# Patient Record
Sex: Female | Born: 1980 | Hispanic: Yes | Marital: Married | Smoking: Never smoker
Health system: Southern US, Community
[De-identification: ages and names within clinical notes are randomized; demographics above are authoritative.]

## PROBLEM LIST (undated history)

## (undated) DIAGNOSIS — K2970 Gastritis, unspecified, without bleeding: Secondary | ICD-10-CM

## (undated) DIAGNOSIS — N814 Uterovaginal prolapse, unspecified: Secondary | ICD-10-CM

## (undated) DIAGNOSIS — D649 Anemia, unspecified: Secondary | ICD-10-CM

## (undated) DIAGNOSIS — I10 Essential (primary) hypertension: Secondary | ICD-10-CM

## (undated) DIAGNOSIS — N83209 Unspecified ovarian cyst, unspecified side: Secondary | ICD-10-CM

## (undated) DIAGNOSIS — A048 Other specified bacterial intestinal infections: Secondary | ICD-10-CM

## (undated) HISTORY — PX: TUBAL LIGATION: SHX77

## (undated) HISTORY — DX: Anemia, unspecified: D64.9

## (undated) HISTORY — PX: WISDOM TOOTH EXTRACTION: SHX21

## (undated) HISTORY — DX: Other specified bacterial intestinal infections: A04.8

---

## 2014-09-17 ENCOUNTER — Encounter (HOSPITAL_COMMUNITY): Payer: Self-pay | Admitting: Family Medicine

## 2014-09-17 ENCOUNTER — Emergency Department (INDEPENDENT_AMBULATORY_CARE_PROVIDER_SITE_OTHER)
Admission: EM | Admit: 2014-09-17 | Discharge: 2014-09-17 | Disposition: A | Discharge status: Home / Self Care | Payer: Medicaid Other | Source: Home / Self Care

## 2014-09-17 DIAGNOSIS — K2100 Gastro-esophageal reflux disease with esophagitis, without bleeding: Secondary | ICD-10-CM

## 2014-09-17 DIAGNOSIS — K5900 Constipation, unspecified: Secondary | ICD-10-CM

## 2014-09-17 DIAGNOSIS — A048 Other specified bacterial intestinal infections: Secondary | ICD-10-CM

## 2014-09-17 DIAGNOSIS — B9681 Helicobacter pylori [H. pylori] as the cause of diseases classified elsewhere: Secondary | ICD-10-CM

## 2014-09-17 HISTORY — DX: Gastritis, unspecified, without bleeding: K29.70

## 2014-09-17 HISTORY — DX: Unspecified ovarian cyst, unspecified side: N83.209

## 2014-09-17 LAB — POCT URINALYSIS DIP (DEVICE)
Bilirubin Urine: NEGATIVE
GLUCOSE, UA: NEGATIVE mg/dL
HGB URINE DIPSTICK: NEGATIVE
Ketones, ur: NEGATIVE mg/dL
Nitrite: NEGATIVE
Protein, ur: NEGATIVE mg/dL
Specific Gravity, Urine: 1.025 (ref 1.005–1.030)
UROBILINOGEN UA: 0.2 mg/dL (ref 0.0–1.0)
pH: 6 (ref 5.0–8.0)

## 2014-09-17 LAB — POCT PREGNANCY, URINE: Preg Test, Ur: NEGATIVE

## 2014-09-17 LAB — POCT H PYLORI SCREEN: H. PYLORI SCREEN, POC: POSITIVE — AB

## 2014-09-17 MED ORDER — SENNA 8.6 MG PO TABS
1.0000 | ORAL_TABLET | Freq: Every day | ORAL | Status: DC
Start: 2014-09-17 — End: 2015-03-20

## 2014-09-17 MED ORDER — POLYETHYLENE GLYCOL 3350 17 G PO PACK: 17.0000 g | PACK | Freq: Every day | ORAL | Status: DC | PRN

## 2014-09-17 MED ORDER — ONDANSETRON HCL 4 MG PO TABS: 4.0000 mg | ORAL_TABLET | Freq: Three times a day (TID) | ORAL | Status: DC | PRN

## 2014-09-17 MED ORDER — AMOXICILL-CLARITHRO-LANSOPRAZ PO MISC
Freq: Two times a day (BID) | ORAL | Status: DC
Start: 2014-09-17 — End: 2015-03-20

## 2014-09-17 NOTE — ED Provider Notes (Addendum)
CSN: [id number]     Arrival date & time 09/17/14  1347 History   None    Chief Complaint  Patient presents with  . Hematemesis    x 3 days  . Constipation    x 3 days  . Headache  . Abdominal Pain    x 2 weeks   (Consider location/radiation/quality/duration/timing/severity/associated sxs/prior Treatment) HPI  Pt encounter aided w/ the help of an interpreter - Spanish   2 wks of abd pain. 3 days ago started developing n/v. About 3 episode daily of non-bilious emesis. No BM over past 3 days. Associated w/ feeling of SOB and HA. Went to school today and became nauseas and started to dry heave. Small amount of phelgm came up and at the end a small amount of blood, and was told to come to ED for further evaluation. Symptoms are not associated w/ food. Able to tolerate some PO. Associated w/ burnjing in throat.   LMP 08/22/15  Seen by [facility]amp O[facility] on 09/11/14 and given ABX for UTI. Still w/ dysuria since that time. No improvement w/ ABX.   Past Medical History  Diagnosis Date  . Gastritis   . Ovarian cyst    History reviewed. No pertinent past surgical history. Family History  Problem Relation Age of Onset  . Asthma Mother   . Diabetes Father    History  Substance Use Topics  . Smoking status: Never Smoker   . Smokeless tobacco: Not on file  . Alcohol Use: No   OB History    No data available     Review of Systems Per HPI with all other pertinent systems negative.   Allergies  Review of patient's allergies indicates no known allergies.  Home Medications   Prior to Admission medications   Not on File   BP 112/81 mmHg  Pulse 78  Temp(Src) 97.8 F (36.6 C) (Oral)  Resp 24  SpO2 95%  LMP 08/21/2014 (Exact Date) Physical Exam  Constitutional: She appears well-developed and well-nourished.  HENT:  Head: Normocephalic and atraumatic.  Eyes: EOM are normal. Pupils are equal, round, and reactive to light.  Neck: Normal range of motion.  Cardiovascular:  Normal rate and normal heart sounds.   No murmur heard. Pulmonary/Chest: Effort normal and breath sounds normal. No respiratory distress. She has no wheezes.  Abdominal: Soft. Bowel sounds are normal. She exhibits no distension and no mass. There is no tenderness. There is no rebound and no guarding.  Musculoskeletal: Normal range of motion. She exhibits no edema or tenderness.  Neurological: No cranial nerve deficit. Coordination normal.  Skin: Skin is warm.  Psychiatric: Her behavior is normal. Judgment and thought content normal.    ED Course  Procedures (including critical care time) Labs Review Labs Reviewed  POCT URINALYSIS DIP (DEVICE) - Abnormal; Notable for the following:    Leukocytes, UA TRACE (*)    All other components within normal limits  POCT H PYLORI SCREEN - Abnormal; Notable for the following:    H. PYLORI SCREEN, POC POSITIVE (*)    All other components within normal limits  POCT PREGNANCY, URINE    Imaging Review No results found.   MDM   1. Constipation, unspecified constipation type   2. Gastroesophageal reflux disease with esophagitis   3. H. pylori infection     + for H.Pylori: will treat w/ prevpack  Abd pain likely multifactorial from GERD and reflux and constipation  No sign of recurrent UTI on UA Pregnancy test neg  H/H showing anemia w/ Hgb 10.9. Asymptomatic. F/u PCP. No need for further eval for GI bleed. Unless persists  Constipation- start miralax and senna  Precautions given and all questions answered  Shelly Flattenavid Tex Conroy, MD Family Medicine 09/17/2014, 3:02 PM    Ozella Rocksavid J Madalena Kesecker, MD 09/17/14 1502  Ozella Rocksavid J Lizzette Carbonell, MD 09/17/14 [phone]

## 2014-09-17 NOTE — ED Notes (Signed)
Pt comes in today with complaints of burning, abdominal pain x [redacted] weeks along with constipation and bloody vomit x 3 days.  Pt also suffers from a headache.  Maggie was in the room serving as interpreter.

## 2014-09-17 NOTE — Discharge Instructions (Signed)
You have a stomach infection cause by H Pylori. This will require several medications to cure Please start the miralax and senna for constipation. Take these medications until you have daily bowel movements Please take the zofran for nausea your blood counts were a little low. please follow up with your regular doctor or go to the emergency room if you develop worsening bloody vomit  Usted tiene una causa infeccin estomacal por H Pylori. Esto requerir varios medicamentos para curar Por favor inicie la Miralax y senna para Magazine features editorel estreimiento. Tomar estos medicamentos hasta que tenga evacuaciones diarias Por favor tmese el zofran para las nuseas sus cuentas de sangre eran un poco baja. por favor, el seguimiento con su mdico regular o vaya a la sala de emergencia si usted desarrolla empeoramiento vmito sangriento

## 2014-12-16 ENCOUNTER — Ambulatory Visit: Payer: Medicaid Other

## 2015-02-17 ENCOUNTER — Other Ambulatory Visit: Payer: Self-pay | Admitting: *Deleted

## 2015-02-17 DIAGNOSIS — N83209 Unspecified ovarian cyst, unspecified side: Secondary | ICD-10-CM

## 2015-02-18 ENCOUNTER — Telehealth: Payer: Self-pay

## 2015-02-18 NOTE — Telephone Encounter (Signed)
Patient referred from [facility] for ovarian cyst. Per Dr. Erin Fulling patient OK to have visit, however, U/S to be completed first. U/S order placed. U/S scheduled for 02/26/15 1030. Attempted to contact patient with pacific interpreter [phone]. Patient answered and then hung up. Attempted a second call. Straight to voicemail. Left message stating we are trying to reach you regarding an appointment, please call clinic. Referral placed in folder to be scheduled.

## 2015-02-21 NOTE — Telephone Encounter (Signed)
Called patient with pacific interpreter [phone] at spouses' number. No answer- left message stating we are trying to reach you when an appt please call us back at the clinics

## 2015-02-24 NOTE — Telephone Encounter (Signed)
Called patient at spouse's number with pacific interpreter [phone] and informed patient of appt and that our front office will be calling you with a follow up appt as well. Patient verbalized understanding to all and had no questions

## 2015-02-26 ENCOUNTER — Ambulatory Visit (HOSPITAL_COMMUNITY)
Admission: RE | Admit: 2015-02-26 | Discharge: 2015-02-26 | Disposition: A | Discharge status: Home / Self Care | Payer: Medicaid Other | Source: Ambulatory Visit | Attending: Obstetrics & Gynecology | Admitting: Obstetrics & Gynecology

## 2015-02-26 DIAGNOSIS — R1031 Right lower quadrant pain: Secondary | ICD-10-CM | POA: Diagnosis present

## 2015-02-26 DIAGNOSIS — N8320 Unspecified ovarian cysts: Secondary | ICD-10-CM | POA: Insufficient documentation

## 2015-02-26 DIAGNOSIS — N83209 Unspecified ovarian cyst, unspecified side: Secondary | ICD-10-CM

## 2015-03-18 ENCOUNTER — Encounter: Payer: Self-pay | Admitting: *Deleted

## 2015-03-20 ENCOUNTER — Ambulatory Visit (INDEPENDENT_AMBULATORY_CARE_PROVIDER_SITE_OTHER): Payer: Medicaid Other | Admitting: Obstetrics & Gynecology

## 2015-03-20 ENCOUNTER — Encounter: Payer: Self-pay | Admitting: Obstetrics & Gynecology

## 2015-03-20 VITALS — BP 128/81 | HR 76 | Temp 98.7°F | Ht 59.0 in | Wt 220.8 lb

## 2015-03-20 DIAGNOSIS — N7011 Chronic salpingitis: Secondary | ICD-10-CM | POA: Diagnosis present

## 2015-03-20 DIAGNOSIS — N920 Excessive and frequent menstruation with regular cycle: Secondary | ICD-10-CM | POA: Diagnosis not present

## 2015-03-20 DIAGNOSIS — N946 Dysmenorrhea, unspecified: Secondary | ICD-10-CM

## 2015-03-20 HISTORY — DX: Dysmenorrhea, unspecified: N94.6

## 2015-03-20 MED ORDER — NORGESTIM-ETH ESTRAD TRIPHASIC 0.18/0.215/0.25 MG-25 MCG PO TABS: 1.0000 | ORAL_TABLET | Freq: Every day | ORAL | Status: DC

## 2015-03-20 NOTE — Patient Instructions (Signed)
Levonorgestrel intrauterine device (IUD) Qu es este medicamento? El LEVONORGESTREL (DIU) es un dispositivo anticonceptivo (control de natalidad). El dispositivo se coloca dentro del tero por un profesional de la salud. Se utiliza para evitar el embarazo y tambin se puede utilizar para tratar el sangrado abundante que ocurre durante su perodo. Dependiendo del dispositivo, se puede utilizar por 3 a 5 aos. Este medicamento puede ser utilizado para otros usos; si tiene alguna pregunta consulte con su proveedor de atencin mdica o con su farmacutico. MARCAS COMERCIALES DISPONIBLES: LILETTA, Mirena, Skyla Qu le debo informar a mi profesional de la salud antes de tomar este medicamento? Necesita saber si usted presenta alguno de los siguientes problemas o situaciones: -exmen de Papanicolaou anormal -cncer de mama, cuello del tero o tero -diabetes -endometritis -si tiene una infeccin plvica o genital actual o en el pasado -tiene ms de una pareja sexual o si su pareja tiene ms de una pareja -enfermedad cardiaca -antecedente de embarazo tubrico o ectpico -problemas del sistema inmunolgico -DIU colocado -enfermedad heptica o tumor del hgado -problemas con la coagulacin o si toma diluyentes sanguneos -usa medicamentos intravenoso -forma inusual del tero -sangrado vaginal que no tiene explicacin -una reaccin alrgica o inusual al levonorgestrel, a otras hormonas, a la silicona o polietilenos, a otros medicamentos, alimentos, colorantes o conservantes -si est embarazada o buscando quedar embarazada -si est amamantando a un beb Cmo debo utilizar este medicamento? Un profesional de la salud coloca este dispositivo en el tero. Hable con su pediatra para informarse acerca del uso de este medicamento en nios. Puede requerir atencin especial. Sobredosis: Pngase en contacto inmediatamente con un centro toxicolgico o una sala de urgencia si usted cree que haya tomado  demasiado medicamento. ATENCIN: Este medicamento es solo para usted. No comparta este medicamento con nadie. Qu sucede si me olvido de una dosis? No se aplica en este caso. Qu puede interactuar con este medicamento? No tome esta medicina con ninguno de los siguientes medicamentos: -amprenavir -bosentano -fosamprenavir Esta medicina tambin puede interactuar con los siguientes medicamentos: -aprepitant -barbitricos para producir el sueo o para el tratamiento de convulsiones -bexaroteno -griseofulvina -medicamentos para tratar los convulsiones, tales como carbamazepina, etotona, felbamato, oxcarbazepina, fenitona, topiramato -modafinilo -pioglitazona -rifabutina -rifampicina -rifapentina -algunos medicamentos para tratar el virus VIH, tales como atazanavir, indinavir, lopinavir, nelfinavir, tipranavir, ritonavir -hierba de [place] -warfarina Puede ser que esta lista no menciona todas las posibles interacciones. Informe a su profesional de la salud de todos los productos a base de hierbas, medicamentos de venta libre o suplementos nutritivos que est tomando. Si usted fuma, consume bebidas alcohlicas o si utiliza drogas ilegales, indqueselo tambin a su profesional de la salud. Algunas sustancias pueden interactuar con su medicamento. A qu debo estar atento al usar este medicamento? Visite a su mdico o a su profesional de la salud para chequear su evolucin peridicamente. Visite a su mdico si usted o su pareja tiene relaciones sexuales con otras personas, se vuelve VIH positivo o contrae una enfermedad de transmisin sexual. Este medicamento no la protege de la infeccin por VIH (SIDA) ni de ninguna otra enfermedad de transmisin sexual. Puede controlar la ubicacin del DIU usted misma palpando con sus dedos limpios los hilos en la parte anterior de la vagina. No tire de los hilos. Es un buen hbito controlar la ubicacin del dispositivo despus de cada perodo menstrual. Si  no slo siente los hilos sino que adems siente otra parte ms del DIU o si no puede sentir los hilos, consulte a su   mdico inmediatamente. El DIU puede salirse por s solo. Puede quedar embarazada si el dispositivo se sale de su lugar. Utilice un mtodo anticonceptivo adicional, como preservativos, y consulte a su proveedor de atencin mdica s observa que el DIU se sali de su lugar. La utilizacin de tampones no cambia la posicin del DIU y no hay inconvenientes en usarlos durante su perodo. Qu efectos secundarios puedo tener al utilizar este medicamento? Efectos secundarios que debe informar a su mdico o a su profesional de la salud tan pronto como sea posible: -reacciones alrgicas como erupcin cutnea, picazn o urticarias, hinchazn de la cara, labios o lengua -fiebre, sntomas gripales -llagas genitales -alta presin sangunea -ausencia de un perodo menstrual durante 6 semanas mientras lo utiliza -dolor, hinchazn o calor en las piernas -dolor o sensibilidad del plvico -dolor de cabeza repentino o severo -signos de embarazo -calambres estomacales -falta de aliento repentina -problemas de coordinacin, del habla, al caminar -sangrado, flujo vaginal inusual -color amarillento de los ojos o la piel Efectos secundarios que, por lo general, no requieren atencin mdica (debe informarlos a su mdico o a su profesional de la salud si persisten o si son molestos): -acn -dolor de pecho -cambios en el deseo sexual o capacidad -cambios de peso -calambres, mareos o sensacin de desmayo mientras se introduce el dispositivo -dolor de cabeza -sangrado menstruales irregulares en los primeros 3 a 6 meses de usar -nuseas Puede ser que esta lista no menciona todos los posibles efectos secundarios. Comunquese a su mdico por asesoramiento mdico sobre los efectos secundarios. Usted puede informar los efectos secundarios a la FDA por telfono al 1-800-FDA-1088. Dnde debo guardar mi  medicina? No se aplica en este caso. ATENCIN: Este folleto es un resumen. Puede ser que no cubra toda la posible informacin. Si usted tiene preguntas acerca de esta medicina, consulte con su mdico, su farmacutico o su profesional de la salud.  2015, Elsevier/Gold Standard. (2011-10-12 16:57:41) Uso de los anticonceptivos orales (Oral Contraception Use) Los anticonceptivos orales (ACO) son medicamentos que se utilizan para evitar el embarazo. Su funcin es evitar que los ovarios liberen vulos. Las hormonas de los ACO tambin hacen que el moco cervical se haga ms espeso, lo que evita que el esperma ingrese al tero. Tambin hacen que la membrana que recubre internamente al tero se vuelva ms fina, lo que no permite que el huevo fertilizado se adhiera a la pared del tero. Los ACO son muy efectivos cuando se toman exactamente como se prescriben. Sin embargo, no previenen contra las enfermedades de transmisin sexual (ETS). La prctica del sexo seguro, como el uso de preservativos, junto con los ACO, ayudan a prevenir ese tipo de enfermedades. Antes de tomar ACO, debe hacerse un examen fsico y un Papanicolau. El mdico podr indicarle anlisis de sangre, si es necesario. El mdico se asegurar de que usted sea una buena candidata para usar anticonceptivos orales. Converse con su mdico acerca de los posibles efectos secundarios de los ACO que podran recetarle. Cuando se inicia el uso de ACO, se pueden tomar durante 2 a 3 meses para que el cuerpo se adapte a los cambios en los niveles hormonales en el cuerpo.  CMO TOMAR LOS ANTICONCEPTIVOS ORALES El mdico le indicar como comenzar a tomar el primer ciclo de ACO. De lo contrario usted puede:   Comenzar el da de inicio del ciclo menstrual. No necesitar proteccin anticonceptiva adicional al comenzar en este momento.   Comenzar el primer domingo luego de su perodo menstrual, o el da   en que adquiere el medicamento. En estos casos deber EchoStartener  proteccin anticonceptiva The TJX Companiesadicional durante los primeros 7 das del Lu[place]neciclo.   Comenzar a tomarlos en cualquier momento del ciclo. Si toma el anticonceptivo dentro de los [address] das de iniciado el perodo, Theme park managerestar protegida de quedar embarazada inmediatamente. En este caso, no necesitar una forma adicional de anticonceptivos. Si comienza en cualquier otro momento del ciclo menstrual, necesitar usar otra forma de anticonceptivo durante [address]  [address]. Si sus ACO son del tipo de los Citigroupllamados minipldoras, podrn impedir el embarazo despus de tomarlas por 2 das (48 horas). Luego de comenzar a tomar los ACO:   Si olvid de tomar 1 pldora, tmela tan pronto como lo recuerde. Tome la siguiente pldora a la hora habitual.   Si dej de tomar 2 o ms pldoras, comunquese con su mdico ya que diferentes pldoras tienen diferentes instrucciones para las dosis que no se han tomado. Si olvida tomar 2 o ms pldoras, utilice un mtodo anticonceptivo adicional hasta que comience su prximo perodo menstrual.   Si utiliza el envase de 28 pldoras que contienen pldoras inactivas y Venezuelaolvida tomar 1 de las ltimas 7 (pldoras sin hormonas), sto no tiene Quarry managerimportancia. Simplemente deseche el resto de las pldoras que no contienen hormonas y comience un nuevo envase.  No importa cuando comience a tomar los anticonceptivos, siempre empiece un nuevo envase el mismo da de la [place]mana. Tenga un envase extra de ACO y use un mtodo anticonceptivo adicional para Restaurant manager, fast foodel caso en que se olvide de tomar algunas pldoras o pierda la caja.  INSTRUCCIONES PARA EL CUIDADO EN EL HOGAR   No fume.   Use siempre un condn para protegerse contra las enfermedades de transmisin sexual. Los ACO no protegen contra las enfermedades de transmisin sexual.   Use un almanaque para Thrivent Financialmarcar los das de su perodo menstrual.   Lea la informacin y consejos que vienen con las ACO. Hable con el profesional si tiene dudas.  SOLICITE ATENCIN MDICA SI:    Presenta nuseas o vmitos.   Tiene flujo o sangrado vaginal anormal.   Aparece una erupcin cutnea.   No tiene el perodo menstrual.   Pierde el cabello.   Necesita tratamiento por cambios en su estado de nimo o por depresin.   Se siente mareada al Liberty Mutualtomar los ACO.   Comienza a aparecer acn con el uso de los ACO.   Ardelle AntonQueda embarazada.  SOLICITE ATENCIN MDICA DE INMEDIATO SI:   Siente dolor en el pecho.   Le falta el aire.   Le duele mucho la cabeza y no puede Human resources officercontrolar el dolor.   Siente adormecimiento o tiene dificultad para hablar.   Tiene problemas de visin.   Presenta dolor, inflamacin o hinchazn en las piernas.  Document Released: 08/12/2011 Document Revised: 04/25/2013 [facility] Patient Information 2015 [employer] DayExitCare, Maryland[place]. This information is not intended to replace advice given to you by your health care provider. Make sure you discuss any questions you have with your health care provider.

## 2015-03-20 NOTE — Progress Notes (Signed)
Deborah Chandler used for interpreter

## 2015-03-20 NOTE — Progress Notes (Signed)
Patient ID: Deborah Chandler, female   DOB: 1980/12/10, 34 y.o.   MRN: [id number]  Chief Complaint  Patient presents with  . Follow-up    had u/s 6/22  pelvic pain, left adnexal mass  HPI Deborah Chandler is a 34 y.o. female.  [id number] Patient's last menstrual period was 02/27/2015.   HPI  Past Medical History  Diagnosis Date  . Gastritis   . Ovarian cyst     History reviewed. No pertinent past surgical history.  Family History  Problem Relation Age of Onset  . Asthma Mother   . Diabetes Father     Social History History  Substance Use Topics  . Smoking status: Never Smoker   . Smokeless tobacco: Never Used  . Alcohol Use: No    No Known Allergies  Current Outpatient Prescriptions  Medication Sig Dispense Refill  . Norgestimate-Ethinyl Estradiol Triphasic 0.18/0.215/0.25 MG-25 MCG tab Take 1 tablet by mouth daily. 1 Package 11   No current facility-administered medications for this visit.    Review of Systems Review of Systems  Constitutional: Negative.   Respiratory: Negative.   Gastrointestinal: Negative.   Genitourinary: Positive for menstrual problem and pelvic pain. Negative for vaginal bleeding and vaginal discharge.    Blood pressure 128/81, pulse 76, temperature 98.7 F (37.1 C), temperature source Oral, height 4\' 11"  (1.499 m), weight 220 lb 12.8 oz (100.154 kg), last menstrual period 02/27/2015.  Physical Exam Physical Exam  Constitutional: She appears well-developed. No distress.  Cardiovascular: Normal rate.   Pulmonary/Chest: Effort normal.  Genitourinary: Vagina normal and uterus normal. No vaginal discharge found.  No mass palpable mild left tenderness  Skin: Skin is warm and dry.  Psychiatric: She has a normal mood and affect. Her behavior is normal.    Data Reviewed CLINICAL DATA: Right lower quadrant pain, ovarian cysts  EXAM: TRANSABDOMINAL AND TRANSVAGINAL ULTRASOUND OF PELVIS  TECHNIQUE: Both transabdominal and  transvaginal ultrasound examinations of the pelvis were performed. Transabdominal technique was performed for global imaging of the pelvis including uterus, ovaries, adnexal regions, and pelvic cul-de-sac. It was necessary to proceed with endovaginal exam following the transabdominal exam to visualize the endometrium.  COMPARISON: None  FINDINGS: Uterus  Measurements: 10.1 x 4.8 x 5.6 cm. No fibroids or other mass visualized.  Endometrium  Thickness: 10 mm. No focal abnormality visualized.  Right ovary  Measurements: 3.0 x 1.7 x 1.8 cm. Normal appearance/no adnexal mass.  Left ovary  Measurements: 2.4 x 1.4 x 1.8 cm. 5.2 x 2.3 x 2.8 cm complex cystic/septated left adnexal mass, distinct from the left ovary, favored to reflect a hydrosalpinx.  Other findings  No free fluid.  IMPRESSION: 5.2 cm complex cystic/septated left adnexal mass, distinct from the left ovary, favored to reflect a hydrosalpinx.  Otherwise negative pelvic ultrasound.   Electronically Signed  By: Charline BillsSriyesh Krishnan M.D.  On: 02/26/2015 11:11  Assessment    Patient Active Problem List   Diagnosis Date Noted  . 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 Dysmenorrhea 03/20/2015  . Menorrhagia 03/20/2015        Plan    Offered ocp vs Mirena. Wants to try OCP No current outpatient prescriptions on file prior to visit.   No current facility-administered medications on file prior to visit.  [facility] RTC 3 mo, information on Mirena         ARNOLD,JAMES 03/20/2015, 1:54 PM

## 2015-03-21 LAB — GC/CHLAMYDIA PROBE AMP
CT PROBE, AMP APTIMA: NEGATIVE
GC Probe RNA: NEGATIVE

## 2015-06-10 ENCOUNTER — Ambulatory Visit: Payer: Medicaid Other

## 2015-06-26 ENCOUNTER — Ambulatory Visit: Payer: Medicaid Other | Admitting: Obstetrics & Gynecology

## 2015-06-29 ENCOUNTER — Encounter (HOSPITAL_COMMUNITY): Payer: Self-pay | Admitting: Vascular Surgery

## 2015-06-29 ENCOUNTER — Emergency Department (HOSPITAL_COMMUNITY)
Admission: EM | Admit: 2015-06-29 | Discharge: 2015-06-30 | Disposition: A | Discharge status: Home / Self Care | Payer: Medicaid Other | Attending: Emergency Medicine | Admitting: Emergency Medicine

## 2015-06-29 DIAGNOSIS — Z8742 Personal history of other diseases of the female genital tract: Secondary | ICD-10-CM | POA: Diagnosis not present

## 2015-06-29 DIAGNOSIS — R110 Nausea: Secondary | ICD-10-CM

## 2015-06-29 DIAGNOSIS — R42 Dizziness and giddiness: Secondary | ICD-10-CM | POA: Insufficient documentation

## 2015-06-29 DIAGNOSIS — R112 Nausea with vomiting, unspecified: Secondary | ICD-10-CM

## 2015-06-29 DIAGNOSIS — T50905A Adverse effect of unspecified drugs, medicaments and biological substances, initial encounter: Secondary | ICD-10-CM

## 2015-06-29 DIAGNOSIS — T41295A Adverse effect of other general anesthetics, initial encounter: Secondary | ICD-10-CM | POA: Diagnosis not present

## 2015-06-29 DIAGNOSIS — Z793 Long term (current) use of hormonal contraceptives: Secondary | ICD-10-CM | POA: Insufficient documentation

## 2015-06-29 DIAGNOSIS — K2970 Gastritis, unspecified, without bleeding: Secondary | ICD-10-CM | POA: Insufficient documentation

## 2015-06-29 DIAGNOSIS — T8859XA Other complications of anesthesia, initial encounter: Secondary | ICD-10-CM

## 2015-06-29 LAB — COMPREHENSIVE METABOLIC PANEL
ALT: 30 U/L (ref 14–54)
AST: 25 U/L (ref 15–41)
Albumin: 3.2 g/dL — ABNORMAL LOW (ref 3.5–5.0)
Alkaline Phosphatase: 62 U/L (ref 38–126)
Anion gap: 8 (ref 5–15)
BUN: 13 mg/dL (ref 6–20)
CHLORIDE: 98 mmol/L — AB (ref 101–111)
CO2: 27 mmol/L (ref 22–32)
Calcium: 8.9 mg/dL (ref 8.9–10.3)
Creatinine, Ser: 0.75 mg/dL (ref 0.44–1.00)
GFR calc Af Amer: >60 mL/min (ref >60)
GFR calc non Af Amer: >60 mL/min (ref >60)
Glucose, Bld: 166 mg/dL — ABNORMAL HIGH (ref 65–99)
POTASSIUM: 4.7 mmol/L (ref 3.5–5.1)
Sodium: 133 mmol/L — ABNORMAL LOW (ref 135–145)
Total Bilirubin: 0.6 mg/dL (ref 0.3–1.2)
Total Protein: 7.3 g/dL (ref 6.5–8.1)

## 2015-06-29 LAB — CBC
HEMATOCRIT: 40.7 % (ref 36.0–46.0)
Hemoglobin: 12.9 g/dL (ref 12.0–15.0)
MCH: 24.5 pg — ABNORMAL LOW (ref 26.0–34.0)
MCHC: 31.7 g/dL (ref 30.0–36.0)
MCV: 77.4 fL — AB (ref 78.0–100.0)
Platelets: 347 10*3/uL (ref 150–400)
RBC: 5.26 MIL/uL — AB (ref 3.87–5.11)
RDW: 14 % (ref 11.5–15.5)
WBC: 14.9 10*3/uL — AB (ref 4.0–10.5)

## 2015-06-29 LAB — URINALYSIS, ROUTINE W REFLEX MICROSCOPIC
Bilirubin Urine: NEGATIVE
Glucose, UA: NEGATIVE mg/dL
Hgb urine dipstick: NEGATIVE
Ketones, ur: NEGATIVE mg/dL
Leukocytes, UA: NEGATIVE
Nitrite: NEGATIVE
PH: 7.5 (ref 5.0–8.0)
Protein, ur: NEGATIVE mg/dL
Specific Gravity, Urine: 1.019 (ref 1.005–1.030)
Urobilinogen, UA: 0.2 mg/dL (ref 0.0–1.0)

## 2015-06-29 MED ORDER — PANTOPRAZOLE SODIUM 40 MG IV SOLR
40.0000 mg | INTRAVENOUS | Status: AC
  Administered 2015-06-29: 40 mg via INTRAVENOUS
  Filled 2015-06-29: qty 40

## 2015-06-29 MED ORDER — ONDANSETRON HCL 4 MG/2ML IJ SOLN
4.0000 mg | Freq: Once | INTRAMUSCULAR | Status: AC
  Administered 2015-06-29: 4 mg via INTRAVENOUS
  Filled 2015-06-29: qty 2

## 2015-06-29 MED ORDER — SODIUM CHLORIDE 0.9 % IV BOLUS (SEPSIS)
1000.0000 mL | Freq: Once | INTRAVENOUS | Status: AC
  Administered 2015-06-29: 1000 mL via INTRAVENOUS

## 2015-06-29 MED ORDER — METOCLOPRAMIDE HCL 5 MG/ML IJ SOLN
10.0000 mg | INTRAMUSCULAR | Status: AC
  Administered 2015-06-30: 10 mg via INTRAVENOUS
  Filled 2015-06-29: qty 2

## 2015-06-29 MED ORDER — KETOROLAC TROMETHAMINE 30 MG/ML IJ SOLN
30.0000 mg | Freq: Once | INTRAMUSCULAR | Status: AC
  Administered 2015-06-30: 30 mg via INTRAVENOUS
  Filled 2015-06-29: qty 1

## 2015-06-29 MED ORDER — SUCRALFATE 1 G PO TABS
1.0000 g | ORAL_TABLET | Freq: Once | ORAL | Status: AC
  Administered 2015-06-30: 1 g via ORAL
  Filled 2015-06-29: qty 1

## 2015-06-29 MED ORDER — GI COCKTAIL ~~LOC~~
30.0000 mL | Freq: Once | ORAL | Status: AC
  Administered 2015-06-29: 30 mL via ORAL
  Filled 2015-06-29: qty 30

## 2015-06-29 NOTE — ED Provider Notes (Signed)
CSN: [id number]     Arrival date & time 06/29/15  1855 History   First MD Initiated Contact with Patient 06/29/15 2037     Chief Complaint  Patient presents with  . Nausea  . Emesis  . Diarrhea     (Consider location/radiation/quality/duration/timing/severity/associated sxs/prior Treatment) HPI Comments: Patient is a 34 year old female with a history of gastritis who presents to the emergency department for further evaluation of nausea and vomiting as well as abdominal pain. Patient reports that she had some teeth extracted 3 days ago, on Friday. Patient was under general anesthesia for the procedure which lasted approximately 15 minutes. Patient states that she came home and began taking her prescribed medications. Patient prescribed amoxicillin, dexamethasone, and hydrocodone tablets for pain. Patient reports that her symptoms began after beginning these medications. She reports feeling nauseous with 6 episodes of emesis in the last 24 hours. Patient began to experience abdominal discomfort in her epigastric abdomen after vomiting began. Epigastric pain has been intermittent and burning. She feels a worsening burning sensation after taking her medicines. Patient has also noted some lightheadedness, but she denies any syncope. She denies the feeling of the room spinning or any other symptoms consistent with dizziness. She has also been experiencing some cramping in her bilateral legs and feet. Cramping is sporadic without modifying factors. Patient denies any fever. She states that she has mild pain at the site of her tooth extractions. No inability to swallow or drooling. No chest pain or shortness of breath, diarrhea, melena or hematochezia, hematemesis, or urinary symptoms. Patient reports having a normal bowel movement yesterday. She tried to see her dentist today, but the office was closed.  DDS - Dr. Orvan Falconer  Patient is a 34 y.o. female presenting with vomiting. The history is provided by  the patient. A language interpreter was used Psychologist, prison and probation services interpreters language line).  Emesis Associated symptoms: abdominal pain and myalgias   Associated symptoms: no diarrhea     Past Medical History  Diagnosis Date  . Gastritis   . Ovarian cyst    Past Surgical History  Procedure Laterality Date  . Wisdom tooth extraction     Family History  Problem Relation Age of Onset  . Asthma Mother   . Diabetes Father    Social History  Substance Use Topics  . Smoking status: Never Smoker   . Smokeless tobacco: Never Used  . Alcohol Use: No   OB History    Gravida Para Term Preterm AB TAB SAB Ectopic Multiple Living   0 0 0 0 0 0 2      Review of Systems  Constitutional: Negative for fever.  HENT: Negative for drooling and trouble swallowing.   Cardiovascular: Negative for chest pain.  Gastrointestinal: Positive for nausea, vomiting and abdominal pain. Negative for diarrhea and blood in stool.  Musculoskeletal: Positive for myalgias.  Neurological: Positive for light-headedness. Negative for dizziness, syncope and weakness.  All other systems reviewed and are negative.   Allergies  Review of patient's allergies indicates no known allergies.  Home Medications   Prior to Admission medications   Medication Sig Start Date End Date Taking? Authorizing Provider  amoxicillin (AMOXIL) 500 MG capsule Take 500 mg by mouth 3 (three) times daily. 7 day course filled 06/24/15   Yes Historical Provider, MD  dexamethasone (DECADRON) 4 MG tablet Take 4 mg by mouth 3 (three) times daily. #9 filled 06/24/15   Yes Historical Provider, MD  HYDROcodone-acetaminophen (NORCO) 10-325 MG tablet Take 1  tablet by mouth every 4 (four) hours as needed (pain).   Yes Historical Provider, MD  ibuprofen (ADVIL,MOTRIN) 200 MG tablet Take 200 mg by mouth every 6 (six) hours as needed (pain).   Yes Historical Provider, MD  acetaminophen (TYLENOL) 500 MG tablet Take 1 tablet (500 mg total) by mouth every  6 (six) hours as needed. 06/30/15   Antony Madura, PA-C  Norgestimate-Ethinyl Estradiol Triphasic 0.18/0.215/0.25 MG-25 MCG tab Take 1 tablet by mouth daily. Patient not taking: Reported on 06/29/2015 03/20/15   Adam Phenix, MD  pantoprazole (PROTONIX) 20 MG tablet Take 1 tablet (20 mg total) by mouth daily. 06/30/15   Antony Madura, PA-C  promethazine (PHENERGAN) 25 MG tablet Take 1 tablet (25 mg total) by mouth every 6 (six) hours as needed for nausea or vomiting. 06/30/15   Antony Madura, PA-C  sucralfate (CARAFATE) 1 GM/10ML suspension Take 10 mLs (1 g total) by mouth 4 (four) times daily -  with meals and at bedtime. 06/30/15   Antony Madura, PA-C   BP 125/92 mmHg  Pulse 64  Temp(Src) 98.9 F (37.2 C) (Oral)  Resp 16  SpO2 93%   Physical Exam  Constitutional: She is oriented to person, place, and time. She appears well-developed and well-nourished. No distress.  Nontoxic/nonseptic appearing  HENT:  Head: Normocephalic and atraumatic.  No trismus. Oropharynx clear. Site of dental extraction appears appropriate. Stitches intact. No erythema or purulent drainage. Patient tolerating secretions without difficulty. No facial swelling.  Eyes: Conjunctivae and EOM are normal. No scleral icterus.  Neck: Normal range of motion.  No nuchal rigidity or meningismus  Cardiovascular: Normal rate, regular rhythm and intact distal pulses.   Pulmonary/Chest: Effort normal and breath sounds normal. No respiratory distress. She has no wheezes. She has no rales.  Respirations even and unlabored  Abdominal: Soft. She exhibits no distension. There is tenderness. There is no rebound and no guarding.  Soft, obese abdomen with mild tenderness to palpation in the epigastric region. No masses or peritoneal signs. No guarding.  Musculoskeletal: Normal range of motion.  Neurological: She is alert and oriented to person, place, and time. She exhibits normal muscle tone. Coordination normal.  GCS 15. Patient moving all  extremities.  Skin: Skin is warm and dry. No rash noted. She is not diaphoretic. No erythema. No pallor.  Psychiatric: She has a normal mood and affect. Her behavior is normal.  Nursing note and vitals reviewed.   ED Course  Procedures (including critical care time) Labs Review Labs Reviewed  COMPREHENSIVE METABOLIC PANEL - Abnormal; Notable for the following:    Sodium 133 (*)    Chloride 98 (*)    Glucose, Bld 166 (*)    Albumin 3.2 (*)    All other components within normal limits  CBC - Abnormal; Notable for the following:    WBC 14.9 (*)    RBC 5.26 (*)    MCV 77.4 (*)    MCH 24.5 (*)    All other components within normal limits  URINALYSIS, ROUTINE W REFLEX MICROSCOPIC (NOT AT [facility]) - Abnormal; Notable for the following:    APPearance CLOUDY (*)    All other components within normal limits    Imaging Review No results found.   I have personally reviewed and evaluated these images and lab results as part of my medical decision-making.   EKG Interpretation None       Filed Vitals:   06/30/15 0045 06/30/15 0100 06/30/15 0106 06/30/15 0155  BP: 114/80  126/81    Pulse: 50 53    Temp:   97.9 F (36.6 C) 97.8 F (36.6 C)  TempSrc:   Oral Oral  Resp:      SpO2: 94% 91%      MDM   Final diagnoses:  Gastritis  Drug-induced nausea and vomiting  Nausea after anesthesia, initial encounter    34 year old female presents to the emergency department for evaluation of nausea and vomiting as well as epigastric discomfort. Patient with history of tooth extraction for which she was put under general anesthesia. Patient also taking Norco for pain after which time her symptoms began. Patient with mild focal tenderness in the epigastric abdomen consistent with patient's known history of gastritis. Suspect that nausea and vomiting may be secondary to hydrocodone versus complication of general anesthesia.  Symptoms controlled in the emergency department with IV fluids,  Protonix, Carafate, GI cocktail, and Toradol. Patient also given Zofran and Reglan for nausea. She has been able to tolerate fluids by mouth without further emesis. Abdomen on reexamination is improved. Doubt emergent cause of symptoms today; do not believe further emergent work up or imaging is indicated. Will d/c with supportive care. Have advised d/c of Norco. F/u with PCP and dentist recommended. Patient agreeable to plan with no unaddressed concerns. Patient discharged in good condition.   Filed Vitals:   06/30/15 0045 06/30/15 0100 06/30/15 0106 06/30/15 0155  BP: 114/80 126/81    Pulse: 50 53    Temp:   97.9 F (36.6 C) 97.8 F (36.6 C)  TempSrc:   Oral Oral  Resp:      SpO2: 94% 91%       Antony MaduraKelly Louna Rothgeb, PA-C 07/01/15 [id number]  Linwood DibblesJon Knapp, MD 07/02/15 0800

## 2015-06-29 NOTE — ED Notes (Addendum)
Pt reports to the ED for eval of tooth extraction Friday and had to go under general anesthesia and when she awoke up she started having N/V, abd pain, bilateral leg pain, and dizziness. She had 6 episodes of emesis. Denies any blood in her emesis. Denies any diarrhea, fevers, or chills. Pt A&OX4, resp e/u, and skin warm and dry. Language line used for triage. Also recently started on amoxicillin, dexamethasone, and hydrocodone.

## 2015-06-30 MED ORDER — PANTOPRAZOLE SODIUM 20 MG PO TBEC: 20.0000 mg | DELAYED_RELEASE_TABLET | Freq: Every day | ORAL | Status: DC

## 2015-06-30 MED ORDER — SUCRALFATE 1 GM/10ML PO SUSP: 1.0000 g | Freq: Three times a day (TID) | ORAL | Status: DC

## 2015-06-30 MED ORDER — ACETAMINOPHEN 500 MG PO TABS: 500.0000 mg | ORAL_TABLET | Freq: Four times a day (QID) | ORAL | Status: DC | PRN

## 2015-06-30 MED ORDER — PROMETHAZINE HCL 25 MG PO TABS: 25.0000 mg | ORAL_TABLET | Freq: Four times a day (QID) | ORAL | Status: DC | PRN

## 2015-06-30 NOTE — ED Notes (Signed)
PO challenge successful.

## 2015-06-30 NOTE — Discharge Instructions (Signed)
Siga tomando Amoxicilina y dexametasona. Deje de tomar Norco / hidrocodona. Tomar Tylenol lugar para Chief Technology Officer. Aplique una compresa de hielo en la cara para el dolor y la inflamacin, segn sea necesario. Tome Protonix y Carafate para el dolor de [place] y fenergn para las nuseas y los vmitos. Tome todos los medicamentos con las comidas; no tome con el estmago vaco. Haga un seguimiento con su mdico de atencin primaria y su dentista si los sntomas persisten.  Keep taking Amoxicillin and Dexamethasone. Stop taking Norco/Hydrocodone. Take tylenol instead for pain. Apply an ice pack to your face for pain and swelling as needed. Take Protonix and Carafate for stomach pain and phenergan for nausea and vomiting. Take all of your medications with food; do not take on an empty stomach. Follow up with your primary care doctor and your dentist if symptoms persist.  Gastritis - Adultos (Gastritis, Adult)  La gastrittis es la irritacin (inflamacin) de la membrana interna del estmago. Puede ser Neomia Dear enfermedad de inicio sbito (aguda) o de largo plazo (crnica). Si la gastritis no se trata, puede causar sangrado y lceras. CAUSAS  La gastritis se produce cuando la membrana que tapiza interiormente al estmago se debilita o se daa. Los jugos digestivos del estmago inflaman el revestimiento del estmago debilitado. El revestimiento del estmago puede debilitarse o daarse por una infeccin viral o bacteriana. La infeccin bacteriana ms comn es la infeccin por Helicobacter pylori. Tambin puede ser el resultado del consumo excesivo de alcohol, por el uso de ciertos medicamentos o porque hay demasiado cido en el estmago.  SNTOMAS  En algunos casos no hay sntomas. Si se presentan sntomas, stos pueden ser:   Dolor o sensacin de ardor en la parte superior del abdomen.  Nuseas.  Vmitos.  Sensacin molesta de distensin despus de comer. DIAGNSTICO  El mdico puede diagnosticar gastritis segn  los sntomas y el examen fsico. Para determinar la causa de la gastritis, el mdico podr:   Pedir anlisis de sangre o de materia fecal para diagnosticar la presencia de la bacteria H pylori.  Gastroscopa. Un tubo delgado y flexible (endoscopio) se pasa por Theatre stage manager al [employer]. El endoscopio tiene [place] luz y una cmara en el extremo. El mdico utilizar el endoscopio para observar el interior del [place].  Tomar una muestra de tejido (biopsia) del estmago para examinarlo en el microscopio. TRATAMIENTO  Segn la causa de la gastritis podrn recetarle: Antibiticos, si la causa es una infeccin bacteriana, como una infeccin por H. pylori. Anticidos o bloqueadores H2, si hay demasiado cido en el estmago. El [employer] aconsejar que deje de tomar aspirina, ibuprofeno u otros antiinflamatorios no esteroides (AINE).  INSTRUCCIONES PARA EL CUIDADO EN EL HOGAR   Tome slo medicamentos de venta libre o recetados, segn las indicaciones del mdico.  Si le han recetado antibiticos, tmelos segn las indicaciones. Tmelos todos, aunque se sienta mejor.  Debe ingerir gran cantidad de lquido para mantener la orina de tono claro o color amarillo plido.  Evite las comidas y bebidas que [address] [place], [place]:  [place] con cafena o alcohlicas.  Chocolate.  Sabores a Advertising account planner.  Ajo y cebolla.  Comidas muy condimentadas.  Ctricos como naranjas, limones o limas.  Alimentos que contengan tomate, como salsas, [place] y pizza.  Alimentos fritos y Lexicographer.  Haga comidas pequeas durante Glass blower/designer de 3 comidas abundantes. SOLICITE ATENCIN MDICA DE INMEDIATO SI:   La materia fecal es negra o de color rojo oscuro.  Vomita sangre de color rojo brillante o material similar a granos de caf.  No puede retener los lquidos.  El dolor abdominal empeora.  Tiene fiebre.  No mejora luego de 1 semana.  Tiene preguntas o preocupaciones. ASEGRESE DE QUE:    Comprende estas instrucciones.  Controlar su enfermedad.  Solicitar ayuda de inmediato si no mejora o si empeora.   Esta informacin no tiene Theme park managercomo fin reemplazar el consejo del mdico. Asegrese de hacerle al mdico cualquier pregunta que tenga.   Document Released: 06/02/2005 Document Revised: 05/14/2015 Elsevier Interactive Patient Education 2016 [employer]  [place] LakeAnestesia general, adultos, cuidados posteriores (General Anesthesia, Adult, Care After) Siga estas instrucciones durante las prximas semanas. Estas indicaciones le proporcionan informacin acerca de cmo deber cuidarse despus del procedimiento. El mdico tambin podr darle instrucciones ms especficas. El tratamiento se ha planificado de acuerdo a las prcticas mdicas actuales, pero a veces se producen problemas. Comunquese con el mdico si tiene algn problema o tiene dudas despus del procedimiento. QU ESPERAR DESPUS DEL PROCEDIMIENTO Despus del procedimiento es habitual experimentar:  Somnolencia.  Nuseas y vmitos. INSTRUCCIONES PARA EL CUIDADO EN EL HOGAR  Durante las primeras 24 horas luego de la anestesia general:  Haga que una persona responsable se quede con usted.  No conduzca un automvil. Si est solo, no viaje en transporte pblico.  No beba alcohol.  No tome medicamentos que no le haya recetado su mdico.  No firme documentos importantes ni tome decisiones trascendentes.  Puede reanudar su dieta y sus actividades normales segn le haya indicado el mdico.  Cambie los vendajes (apsitos) tal como se le indic.  Si tiene preguntas o se le presenta algn problema relacionado con la anestesia general, comunquese con el hospital y pida por el anestesista o anestesilogo de [place]. SOLICITE ATENCIN MDICA SI:  Tiene nuseas y vmitos durante el da posterior a la anestesia.  Le aparece una erupcin cutnea. SOLICITE ATENCIN MDICA DE INMEDIATO SI:   Tiene dificultad para  respirar.  Siente dolor en el pecho.  Tiene algn problema alrgico.   Esta informacin no tiene como fin reemplazar el consejo del mdico. Asegrese de hacerle al mdico cualquier pregunta que tenga.   Document Released: 08/23/2005 Document Revised: 09/13/2014 Elsevier Interactive Patient Education [employer]2016 Elsevier Inc.

## 2015-11-17 ENCOUNTER — Encounter (HOSPITAL_COMMUNITY): Payer: Self-pay | Admitting: Emergency Medicine

## 2015-11-17 DIAGNOSIS — R51 Headache: Secondary | ICD-10-CM | POA: Insufficient documentation

## 2015-11-17 DIAGNOSIS — R05 Cough: Secondary | ICD-10-CM | POA: Insufficient documentation

## 2015-11-17 DIAGNOSIS — K2970 Gastritis, unspecified, without bleeding: Secondary | ICD-10-CM | POA: Insufficient documentation

## 2015-11-17 DIAGNOSIS — Z79899 Other long term (current) drug therapy: Secondary | ICD-10-CM | POA: Diagnosis not present

## 2015-11-17 DIAGNOSIS — N898 Other specified noninflammatory disorders of vagina: Secondary | ICD-10-CM | POA: Diagnosis not present

## 2015-11-17 DIAGNOSIS — Z8742 Personal history of other diseases of the female genital tract: Secondary | ICD-10-CM | POA: Diagnosis not present

## 2015-11-17 DIAGNOSIS — Z792 Long term (current) use of antibiotics: Secondary | ICD-10-CM | POA: Insufficient documentation

## 2015-11-17 DIAGNOSIS — Z3202 Encounter for pregnancy test, result negative: Secondary | ICD-10-CM | POA: Diagnosis not present

## 2015-11-17 DIAGNOSIS — R1084 Generalized abdominal pain: Secondary | ICD-10-CM | POA: Diagnosis present

## 2015-11-17 LAB — CBC
HCT: 39.7 % (ref 36.0–46.0)
HEMOGLOBIN: 12.5 g/dL (ref 12.0–15.0)
MCH: 23.9 pg — AB (ref 26.0–34.0)
MCHC: 31.5 g/dL (ref 30.0–36.0)
MCV: 76.1 fL — AB (ref 78.0–100.0)
Platelets: 291 10*3/uL (ref 150–400)
RBC: 5.22 MIL/uL — AB (ref 3.87–5.11)
RDW: 14.5 % (ref 11.5–15.5)
WBC: 11.8 10*3/uL — AB (ref 4.0–10.5)

## 2015-11-17 NOTE — ED Notes (Signed)
Pt. reports mid/upper abdominal pain with nausea and emesis onset this afternoon , denies diarrhea or fever . Interpreter ( Spanish ) service used at triage .

## 2015-11-18 ENCOUNTER — Emergency Department (HOSPITAL_COMMUNITY)
Admission: EM | Admit: 2015-11-18 | Discharge: 2015-11-18 | Disposition: A | Discharge status: Home / Self Care | Payer: Medicaid Other | Attending: Emergency Medicine | Admitting: Emergency Medicine

## 2015-11-18 DIAGNOSIS — R1084 Generalized abdominal pain: Secondary | ICD-10-CM

## 2015-11-18 DIAGNOSIS — K2970 Gastritis, unspecified, without bleeding: Secondary | ICD-10-CM

## 2015-11-18 LAB — COMPREHENSIVE METABOLIC PANEL
ALBUMIN: 3.3 g/dL — AB (ref 3.5–5.0)
ALK PHOS: 66 U/L (ref 38–126)
ALT: 30 U/L (ref 14–54)
AST: 24 U/L (ref 15–41)
Anion gap: 10 (ref 5–15)
BILIRUBIN TOTAL: 0.6 mg/dL (ref 0.3–1.2)
BUN: 12 mg/dL (ref 6–20)
CALCIUM: 9 mg/dL (ref 8.9–10.3)
CO2: 25 mmol/L (ref 22–32)
CREATININE: 0.65 mg/dL (ref 0.44–1.00)
Chloride: 103 mmol/L (ref 101–111)
GFR calc Af Amer: >60 mL/min (ref >60)
GLUCOSE: 168 mg/dL — AB (ref 65–99)
POTASSIUM: 4.3 mmol/L (ref 3.5–5.1)
Sodium: 138 mmol/L (ref 135–145)
TOTAL PROTEIN: 7.3 g/dL (ref 6.5–8.1)

## 2015-11-18 LAB — URINALYSIS, ROUTINE W REFLEX MICROSCOPIC
BILIRUBIN URINE: NEGATIVE
GLUCOSE, UA: NEGATIVE mg/dL
HGB URINE DIPSTICK: NEGATIVE
KETONES UR: NEGATIVE mg/dL
Leukocytes, UA: NEGATIVE
Nitrite: NEGATIVE
PROTEIN: NEGATIVE mg/dL
Specific Gravity, Urine: 1.01 (ref 1.005–1.030)
pH: 6.5 (ref 5.0–8.0)

## 2015-11-18 LAB — WET PREP, GENITAL
CLUE CELLS WET PREP: NONE SEEN
Sperm: NONE SEEN
Trich, Wet Prep: NONE SEEN
Yeast Wet Prep HPF POC: NONE SEEN

## 2015-11-18 LAB — POC URINE PREG, ED: PREG TEST UR: NEGATIVE

## 2015-11-18 LAB — LIPASE, BLOOD: Lipase: 28 U/L (ref 11–51)

## 2015-11-18 MED ORDER — CEFTRIAXONE SODIUM 250 MG IJ SOLR
250.0000 mg | Freq: Once | INTRAMUSCULAR | Status: AC
  Administered 2015-11-18: 250 mg via INTRAMUSCULAR
  Filled 2015-11-18: qty 250

## 2015-11-18 MED ORDER — GI COCKTAIL ~~LOC~~
30.0000 mL | Freq: Once | ORAL | Status: AC
  Administered 2015-11-18: 30 mL via ORAL
  Filled 2015-11-18: qty 30

## 2015-11-18 MED ORDER — PANTOPRAZOLE SODIUM 40 MG PO TBEC
40.0000 mg | DELAYED_RELEASE_TABLET | Freq: Once | ORAL | Status: AC
  Administered 2015-11-18: 40 mg via ORAL
  Filled 2015-11-18: qty 1

## 2015-11-18 MED ORDER — PROMETHAZINE HCL 25 MG PO TABS: 25.0000 mg | ORAL_TABLET | Freq: Four times a day (QID) | ORAL | Status: DC | PRN

## 2015-11-18 MED ORDER — ACETAMINOPHEN 500 MG PO TABS
1000.0000 mg | ORAL_TABLET | Freq: Once | ORAL | Status: AC
  Administered 2015-11-18: 1000 mg via ORAL
  Filled 2015-11-18: qty 2

## 2015-11-18 MED ORDER — ONDANSETRON 4 MG PO TBDP
4.0000 mg | ORAL_TABLET | Freq: Once | ORAL | Status: AC
  Administered 2015-11-18: 4 mg via ORAL
  Filled 2015-11-18: qty 1

## 2015-11-18 MED ORDER — AZITHROMYCIN 250 MG PO TABS
1000.0000 mg | ORAL_TABLET | Freq: Once | ORAL | Status: AC
  Administered 2015-11-18: 1000 mg via ORAL
  Filled 2015-11-18: qty 4

## 2015-11-18 MED ORDER — PANTOPRAZOLE SODIUM 20 MG PO TBEC: 40.0000 mg | DELAYED_RELEASE_TABLET | Freq: Every day | ORAL | Status: DC

## 2015-11-18 NOTE — ED Provider Notes (Signed)
CSN: [id number]     Arrival date & time 11/17/15  2251 History   First MD Initiated Contact with Patient 11/18/15 0703     Chief Complaint  Patient presents with  . Abdominal Pain     (Consider location/radiation/quality/duration/timing/severity/associated sxs/prior Treatment) Patient is a 35 y.o. female presenting with abdominal pain. The history is limited by a language barrier. A language interpreter was used.  Abdominal Pain Pain location:  Generalized Pain quality: burning and cramping   Pain radiates to:  Does not radiate Pain severity:  Severe (8/10) Duration:  1 day Timing:  Constant Progression:  Waxing and waning Chronicity:  New Relieved by:  Nothing Associated symptoms: cough, nausea and vomiting   Associated symptoms: no chest pain, no constipation, no diarrhea, no dysuria, no fever, no shortness of breath, no sore throat, no vaginal bleeding and no vaginal discharge   Risk factors: has not had multiple surgeries     Past Medical History  Diagnosis Date  . Gastritis   . Ovarian cyst    Past Surgical History  Procedure Laterality Date  . Wisdom tooth extraction     Family History  Problem Relation Age of Onset  . Asthma Mother   . Diabetes Father    Social History  Substance Use Topics  . Smoking status: Never Smoker   . Smokeless tobacco: Never Used  . Alcohol Use: No   OB History    Gravida Para Term Preterm AB TAB SAB Ectopic Multiple Living   2 2 2  0 0 0 0 0 0 2     Review of Systems  Constitutional: Negative for fever.  HENT: Negative for sore throat.   Eyes: Negative for visual disturbance.  Respiratory: Positive for cough. Negative for shortness of breath.   Cardiovascular: Negative for chest pain.  Gastrointestinal: Positive for nausea, vomiting and abdominal pain. Negative for diarrhea and constipation.  Genitourinary: Negative for dysuria, vaginal bleeding, vaginal discharge and difficulty urinating.  Musculoskeletal: Negative for back  pain and neck pain.  Skin: Negative for rash.  Neurological: Positive for headaches. Negative for syncope.      Allergies  Review of patient's allergies indicates no known allergies.  Home Medications   Prior to Admission medications   Medication Sig Start Date End Date Taking? Authorizing Provider  acetaminophen (TYLENOL) 500 MG tablet Take 1 tablet (500 mg total) by mouth every 6 (six) hours as needed. 06/30/15   Antony MaduraKelly Humes, PA-C  amoxicillin (AMOXIL) 500 MG capsule Take 500 mg by mouth 3 (three) times daily. 7 day course filled 06/24/15    Historical Provider, MD  dexamethasone (DECADRON) 4 MG tablet Take 4 mg by mouth 3 (three) times daily. #9 filled 06/24/15    Historical Provider, MD  HYDROcodone-acetaminophen (NORCO) 10-325 MG tablet Take 1 tablet by mouth every 4 (four) hours as needed (pain).    Historical Provider, MD  ibuprofen (ADVIL,MOTRIN) 200 MG tablet Take 200 mg by mouth every 6 (six) hours as needed (pain).    Historical Provider, MD  Norgestimate-Ethinyl Estradiol Triphasic 0.18/0.215/0.25 MG-25 MCG tab Take 1 tablet by mouth daily. Patient not taking: Reported on 06/29/2015 03/20/15   Adam PhenixJames G Arnold, MD  pantoprazole (PROTONIX) 20 MG tablet Take 1 tablet (20 mg total) by mouth daily. 06/30/15   Antony MaduraKelly Humes, PA-C  promethazine (PHENERGAN) 25 MG tablet Take 1 tablet (25 mg total) by mouth every 6 (six) hours as needed for nausea or vomiting. 06/30/15   Antony MaduraKelly Humes, PA-C  sucralfate (CARAFATE) 1  GM/10ML suspension Take 10 mLs (1 g total) by mouth 4 (four) times daily -  with meals and at bedtime. 06/30/15   Antony Madura, PA-C   BP 124/102 mmHg  Pulse 69  Temp(Src) 98.2 F (36.8 C) (Oral)  Resp 18  Ht  (1.549 m)  Wt 229 lb (103.874 kg)  BMI 43.29 kg/m2  SpO2 99%  LMP 10/14/2015 Physical Exam  Constitutional: She is oriented to person, place, and time. She appears well-developed and well-nourished. No distress.  HENT:  Head: Normocephalic and atraumatic.   Eyes: Conjunctivae and EOM are normal.  Neck: Normal range of motion.  Cardiovascular: Normal rate, regular rhythm, normal heart sounds and intact distal pulses.  Exam reveals no gallop and no friction rub.   No murmur heard. Pulmonary/Chest: Effort normal and breath sounds normal. No respiratory distress. She has no wheezes. She has no rales.  Abdominal: Soft. She exhibits no distension. There is tenderness (mild diffuse). There is no guarding, no CVA tenderness, no tenderness at McBurney's point and negative Murphy's sign.  Genitourinary: Uterus is not tender. Cervix exhibits motion tenderness (reports small amt). Right adnexum displays no tenderness. Left adnexum displays no tenderness. Vaginal discharge (scant) found.  Musculoskeletal: She exhibits no edema or tenderness.  Neurological: She is alert and oriented to person, place, and time.  Skin: Skin is warm and dry. No rash noted. She is not diaphoretic. No erythema.  Nursing note and vitals reviewed.   ED Course  Procedures (including critical care time) Labs Review Labs Reviewed  COMPREHENSIVE METABOLIC PANEL - Abnormal; Notable for the following:    Glucose, Bld 168 (*)    Albumin 3.3 (*)    All other components within normal limits  CBC - Abnormal; Notable for the following:    WBC 11.8 (*)    RBC 5.22 (*)    MCV 76.1 (*)    MCH 23.9 (*)    All other components within normal limits  URINALYSIS, ROUTINE W REFLEX MICROSCOPIC (NOT AT [facility]) - Abnormal; Notable for the following:    APPearance CLOUDY (*)    All other components within normal limits  WET PREP, GENITAL  LIPASE, BLOOD  POC URINE PREG, ED  GC/CHLAMYDIA PROBE AMP ([place]) NOT AT [facility]    Imaging Review No results found. I have personally reviewed and evaluated these images and lab results as part of my medical decision-making.   EKG Interpretation None      MDM   Final diagnoses:  Gastritis  Generalized abdominal pain   35 year old female with  a history of gastritis presents to the emergency department for concern of epigastric abdominal pain, nausea and emesis beginning yesterday.  CMP shows normal transaminases, lipase is within normal limits. No turn no sign of urinary tract infection. Pregnancy test is negative. Patient reports mild lower abdominal tenderness on exam, and offered testing for pelvic infections which pt would like.  Did pelvic exam which does not show any significant tenderness and doubt TOA, PID, torsion.  Gave option for empiric abx for GC/Chl which pt agrees to and she was given rocephin/azithromycin.  Exam is not consistent with appendicitis, negative murphy's sign and doubt cholecystitis.  Feel presentation is most consistent with gastritis, and given associated sick contacts and cough may be viral. Doubt pneumonia given afebrile, normal breat sounds bilaterally.  Pt with history of gastritis however no longer taking medications.  Given GI cocktail, protonix, zofran and tylenol.  Given rx for protonix and recommend close PCP follow  up.   Alvira Monday, MD 11/18/15 [phone]

## 2016-02-04 ENCOUNTER — Encounter: Payer: Self-pay | Admitting: Family Medicine

## 2016-02-04 ENCOUNTER — Ambulatory Visit (INDEPENDENT_AMBULATORY_CARE_PROVIDER_SITE_OTHER): Payer: Medicaid Other | Admitting: Family Medicine

## 2016-02-04 VITALS — BP 140/99 | HR 77 | Temp 97.7°F | Ht 61.0 in | Wt 219.2 lb

## 2016-02-04 DIAGNOSIS — B372 Candidiasis of skin and nail: Secondary | ICD-10-CM

## 2016-02-04 DIAGNOSIS — K219 Gastro-esophageal reflux disease without esophagitis: Secondary | ICD-10-CM | POA: Diagnosis present

## 2016-02-04 DIAGNOSIS — R112 Nausea with vomiting, unspecified: Secondary | ICD-10-CM | POA: Diagnosis not present

## 2016-02-04 HISTORY — DX: Candidiasis of skin and nail: B37.2

## 2016-02-04 LAB — POCT H PYLORI SCREEN: H Pylori Screen, POC: NEGATIVE

## 2016-02-04 MED ORDER — PANTOPRAZOLE SODIUM 40 MG PO TBEC: 40.0000 mg | DELAYED_RELEASE_TABLET | Freq: Two times a day (BID) | ORAL | Status: DC

## 2016-02-04 MED ORDER — CLOTRIMAZOLE-BETAMETHASONE 1-0.05 % EX CREA: 1.0000 "application " | TOPICAL_CREAM | Freq: Two times a day (BID) | CUTANEOUS | Status: DC

## 2016-02-04 MED ORDER — ONDANSETRON 4 MG PO TBDP: 4.0000 mg | ORAL_TABLET | Freq: Three times a day (TID) | ORAL | Status: DC | PRN

## 2016-02-05 NOTE — Progress Notes (Signed)
   Subjective:   Deborah Chandler is a 35 y.o. female with a history of GERD and menorrhagia here for abd pain   ABDOMINAL PAIN  Pain began 5 days ago Medications tried: none Similar pain before:yes Prior abdominal surgeries: no  Symptoms Nausea/vomiting: yes Diarrhea: no Constipation: no Blood in stool: no Blood in vomit: slight streaks since yesterday Fever: no Dysuria: no Loss of appetite: no Weight loss: no  Vaginal Bleeding: no Missed menstrual period: no  Rash: Pt also reports redness on her inner thighs where they rub together and get sweaty when she works. Painful and spreading.  Review of Symptoms - see HPI PMH - Smoking status noted.      Objective:  BP 140/99 mmHg  Pulse 77  Temp(Src) 97.7 F (36.5 C) (Oral)  Ht 5\' 1"  (1.549 m)  Wt 219 lb 3.2 oz (99.428 kg)  BMI 41.44 kg/m2  LMP 01/26/2016  Gen:  35 y.o. female in NAD HEENT: NCAT, MMM, anicteric sclerae CV: RRR, no MRG Resp: Non-labored, CTAB, no wheezes noted Abd: Soft, mild epigastric tenderness on deep palpation but otherwise NTND, BS present, no guarding or organomegaly Ext: WWP, no edema Neuro: Alert and oriented, speech normal    Assessment & Plan:     Deborah Chandler is a 35 y.o. female here for abd pain  Nausea with vomiting Nausea, vomiting and worsening of chronic gerd symptoms x5 days, no fever or diarrhea, benign exam - likely infectious vs PUD - check H pylori - start PPI scheduled and zofran prn - f/u in 2 weeks, sooner if not getting better  Candidal intertrigo Thigh chafing and spreading inflammation - lotrisone topical      Beverely LowElena Mahi Zabriskie, MD, MPH [facility] PGY-3 02/05/2016 4:10 PM

## 2016-02-05 NOTE — Assessment & Plan Note (Signed)
Nausea, vomiting and worsening of chronic gerd symptoms x5 days, no fever or diarrhea, benign exam - likely infectious vs PUD - check H pylori - start PPI scheduled and zofran prn - f/u in 2 weeks, sooner if not getting better

## 2016-02-05 NOTE — Assessment & Plan Note (Signed)
Thigh chafing and spreading inflammation - lotrisone topical

## 2016-02-23 ENCOUNTER — Encounter: Payer: Self-pay | Admitting: Family Medicine

## 2016-02-23 ENCOUNTER — Ambulatory Visit (INDEPENDENT_AMBULATORY_CARE_PROVIDER_SITE_OTHER): Payer: Medicaid Other | Admitting: Family Medicine

## 2016-02-23 VITALS — BP 136/78 | Temp 98.0°F | Wt 214.6 lb

## 2016-02-23 DIAGNOSIS — R300 Dysuria: Secondary | ICD-10-CM

## 2016-02-23 DIAGNOSIS — R10816 Epigastric abdominal tenderness: Secondary | ICD-10-CM

## 2016-02-23 DIAGNOSIS — A084 Viral intestinal infection, unspecified: Secondary | ICD-10-CM

## 2016-02-23 LAB — CBC WITH DIFFERENTIAL/PLATELET
BASOS ABS: 0 {cells}/uL (ref 0–200)
BASOS PCT: 0 %
EOS ABS: 220 {cells}/uL (ref 15–500)
Eosinophils Relative: 2 %
HEMATOCRIT: 37.3 % (ref 35.0–45.0)
Hemoglobin: 11.9 g/dL (ref 11.7–15.5)
LYMPHS PCT: 45 %
Lymphs Abs: 4950 cells/uL — ABNORMAL HIGH (ref 850–3900)
MCH: 23.7 pg — AB (ref 27.0–33.0)
MCHC: 31.9 g/dL — ABNORMAL LOW (ref 32.0–36.0)
MCV: 74.2 fL — AB (ref 80.0–100.0)
MONO ABS: 660 {cells}/uL (ref 200–950)
MONOS PCT: 6 %
MPV: 9.6 fL (ref 7.5–12.5)
NEUTROS ABS: 5170 {cells}/uL (ref 1500–7800)
Neutrophils Relative %: 47 %
PLATELETS: 324 10*3/uL (ref 140–400)
RBC: 5.03 MIL/uL (ref 3.80–5.10)
RDW: 15.1 % — ABNORMAL HIGH (ref 11.0–15.0)
WBC: 11 10*3/uL — ABNORMAL HIGH (ref 3.8–10.8)

## 2016-02-23 LAB — LIPASE: LIPASE: 23 U/L (ref 7–60)

## 2016-02-23 LAB — COMPLETE METABOLIC PANEL WITH GFR
ALK PHOS: 59 U/L (ref 33–115)
ALT: 20 U/L (ref 6–29)
AST: 15 U/L (ref 10–30)
Albumin: 3.9 g/dL (ref 3.6–5.1)
BILIRUBIN TOTAL: 0.5 mg/dL (ref 0.2–1.2)
BUN: 11 mg/dL (ref 7–25)
CALCIUM: 8.9 mg/dL (ref 8.6–10.2)
CO2: 27 mmol/L (ref 20–31)
CREATININE: 0.72 mg/dL (ref 0.50–1.10)
Chloride: 103 mmol/L (ref 98–110)
GFR, Est Non African American: >89 mL/min (ref >=60)
Glucose, Bld: 83 mg/dL (ref 65–99)
Potassium: 3.9 mmol/L (ref 3.5–5.3)
Sodium: 139 mmol/L (ref 135–146)
TOTAL PROTEIN: 6.9 g/dL (ref 6.1–8.1)

## 2016-02-23 LAB — POCT UA - MICROSCOPIC ONLY

## 2016-02-23 LAB — POCT URINALYSIS DIPSTICK
BILIRUBIN UA: NEGATIVE
GLUCOSE UA: NEGATIVE
KETONES UA: NEGATIVE
Nitrite, UA: NEGATIVE
PH UA: 6
Protein, UA: NEGATIVE
RBC UA: NEGATIVE
Spec Grav, UA: 1.015
Urobilinogen, UA: 0.2

## 2016-02-23 NOTE — Patient Instructions (Addendum)
-   Take small sips every 5 minutes of fluids, especially gatorade G2 to maintain hydration. Clear broths are also fine. Water and juice are not the best choices because you need a better balance of sugar and salt. - Have everyone wash their hands frequently.  You should keep having normal urine output and stay relatively alert and active. If you are unable to maintain hydration, please call the clinic at [phone] immediately or go to the [facility].   GERD (REFLUX) causes heartburn, but is also an extremely common cause of respiratory symptoms, many times with no significant heartburn at all.   It can be treated with medication, but also with lifestyle changes including avoidance of late meals, excessive alcohol, smoking cessation, and avoid fatty foods, chocolate, peppermint, colas, red wine, and acidic juices such as orange juice.  NO MINT OR MENTHOL PRODUCTS SO NO COUGH DROPS  USE SUGARLESS CANDY INSTEAD  NO OIL BASED VITAMINS - use powdered substitutes.  Limit the intake of caffeine, alcohol and soda.  Don't exercise too soon after eating.  Don't lie down within 3-4 hours of eating.  Elevate the head of your bed.

## 2016-02-23 NOTE — Assessment & Plan Note (Signed)
Likely infectious origin given diarrhea, doubt dysentery however without fever. Abd exam benign. Not clinically dehydrated (MMM, HR 60bpm). With dysuria, abd pain; consideration given to urolithiasis or ascending UTI. No CVA or suprapubic tenderness.   - Check CBC, CMP, lipase, UA - Note for work given for 3 days.  - Continue zofran. If infection recurs, would discontinue PPI.  - Return if unable to maintain hydration or bloody diarrhea develops

## 2016-02-23 NOTE — Progress Notes (Signed)
Subjective: Deborah Chandler is a 35 y.o. female with a history of GERD presenting for abdominal pain and vomiting.   Stratus video interpretor used throughout encounter.   Vomiting began gradually about 7 days ago, associated with burning abdominal pain without localization. Emesis is approximately twice per day, always stomach contents, always after eating something. Reports she is unable to keep any food down, but drinks plenty of water. Also had loose stools 2 - 3 times per day during this time with one episode of small quantity of red blood when she wiped but no coffee ground emesis or melena. No fever, but feels cold sometimes.    She lives in a shelter, so is not present when food is prepared. Her son and daughter also have stomach pain but no emesis. Recently started taking PPI and zofran for similar symptoms of nausea and vomiting but did not have diarrhea at that time. No specific medications for this. Denies any antibiotics. No recent travel or weight loss. No jaundice.  - ROS: As above and + dysuria. No vaginal bleeding or discharge.  - Non-smoker, denies overuse of alcohol.   Objective: BP 136/78 mmHg  Temp(Src) 98 F (36.7 C) (Oral)  Wt 214 lb 9.6 oz (97.342 kg)  LMP 01/26/2016 Gen: Obese well-appearing 35 y.o. female in no distress CV: Regular rate, no murmur; radial, DP and PT pulses 2+ bilaterally; trace LE edema, no JVD, cap refill < 2 sec. Pulm: Non-labored breathing ambient air; CTAB, no wheezes or crackles GI: Normoactive-BS; soft, non-tender, non-distended, no organomegaly, no hernia appreciated. No CVA tenderness.  Recent H pylori screen: negative  Assessment/Plan: Deborah Chandler is a 35 y.o. female here for abd pain, vomiting and diarrhea.  Viral gastroenteritis Likely infectious origin given diarrhea, doubt dysentery however without fever. Abd exam benign. Not clinically dehydrated (MMM, HR 60bpm). With dysuria, abd pain; consideration given to  urolithiasis or ascending UTI. No CVA or suprapubic tenderness.   - Check CBC, CMP, lipase, UA - Note for work given for 3 days.  - Continue zofran. If infection recurs, would discontinue PPI.  - Return if unable to maintain hydration or bloody diarrhea develops

## 2016-02-24 ENCOUNTER — Telehealth: Payer: Self-pay | Admitting: Family Medicine

## 2016-02-24 DIAGNOSIS — R1032 Left lower quadrant pain: Secondary | ICD-10-CM | POA: Insufficient documentation

## 2016-02-24 DIAGNOSIS — R112 Nausea with vomiting, unspecified: Secondary | ICD-10-CM

## 2016-02-24 MED ORDER — METRONIDAZOLE 500 MG PO TABS: 500.0000 mg | ORAL_TABLET | Freq: Three times a day (TID) | ORAL | Status: DC

## 2016-02-24 MED ORDER — CIPROFLOXACIN HCL 500 MG PO TABS: 500.0000 mg | ORAL_TABLET | Freq: Two times a day (BID) | ORAL | Status: DC

## 2016-02-24 NOTE — Telephone Encounter (Signed)
Patient's husband called very upset [facility]. According with doctors' notes from same day appointment, I explained that doctor Jarvis NewcomerGrunz advised patient to keep taking zofran (prescribed on May 31 by PCP doctor Adamo). Mr. Lowella GripVente became very rude demanding prescription for pantoprazole, I explained his wife was prescribed it on May 31 by doctor Adamo and she still has a refill for 60 tablets. Mr. Lowella GripVente yelled to me and hang up. This is not the first time this family is rude and always demand immediate attention.

## 2016-02-24 NOTE — Telephone Encounter (Signed)
FYI to PCP

## 2016-02-24 NOTE — Telephone Encounter (Signed)
Patient's daughter called asking to talk with doctor Jarvis NewcomerGrunz about prescription from yesterday appointment.

## 2016-02-24 NOTE — Telephone Encounter (Signed)
Called patient to discuss lab results and inquire about her condition. She now states that her symptoms have continued since her visit with me 3 weeks ago and she is having severe left lower quadrant pain. I am now concerned for possible diverticulitis vs bacterial colitis given this. I have ordered a CT of her abdomen and started empiric cipro and flagyl. She has f/u scheduled with me next week.

## 2016-03-01 ENCOUNTER — Encounter: Payer: Self-pay | Admitting: Family Medicine

## 2016-03-01 ENCOUNTER — Ambulatory Visit (INDEPENDENT_AMBULATORY_CARE_PROVIDER_SITE_OTHER): Payer: Medicaid Other | Admitting: Family Medicine

## 2016-03-01 VITALS — BP 147/108 | HR 76 | Temp 98.6°F | Wt 213.2 lb

## 2016-03-01 DIAGNOSIS — K219 Gastro-esophageal reflux disease without esophagitis: Secondary | ICD-10-CM

## 2016-03-01 DIAGNOSIS — R112 Nausea with vomiting, unspecified: Secondary | ICD-10-CM | POA: Diagnosis not present

## 2016-03-01 DIAGNOSIS — R1032 Left lower quadrant pain: Secondary | ICD-10-CM

## 2016-03-01 LAB — POCT WET PREP (WET MOUNT): Clue Cells Wet Prep Whiff POC: NEGATIVE

## 2016-03-01 MED ORDER — METOCLOPRAMIDE HCL 10 MG PO TABS: 10.0000 mg | ORAL_TABLET | Freq: Four times a day (QID) | ORAL | Status: DC

## 2016-03-01 MED ORDER — PANTOPRAZOLE SODIUM 40 MG PO TBEC: 40.0000 mg | DELAYED_RELEASE_TABLET | Freq: Two times a day (BID) | ORAL | Status: DC

## 2016-03-01 NOTE — Assessment & Plan Note (Signed)
The patient is presenting with over one week of both epigastric burning, and left lower quadrant pain.  Most likely diagnoses include diverticulitis versus bacterial colitis versus somatic versus IBD versus ovarian etiology.  Given her diarrhea, this is less likely an ovarian etiology.  The patient has had significant weight loss, initially her weight was 219 pounds, is now 213 pounds.   -Advised the patient to continue the ciprofloxacin and Flagyl. -Discontinue Zofran as it is not been beneficial, start Reglan 10 mg every 6 hours as needed for nausea. -Advised patient to restart Protonix 40 mg twice a day, as I suspect her epigastric pain may be secondary to esophageal reflux versus PUD. -Patient has a CT of her abdomen and pelvis ordered for June 29 at 9:30. Advised the patient to keep this appointment as well as her appointment with Dr. abdominal later that day. -Discussed strict return precautions with the patient. -Additional he, given all of her social issues, provided the patient with additional resources of other shelters in the area that may be able to accept her and her family

## 2016-03-01 NOTE — Progress Notes (Signed)
Subjective: CC: f/u abdominal pain HPI: Patient is a 35 y.o. female with a past medical history of GERD presenting to clinic today for a same day appt for abdominal pain.  The patient has had 10 days of vomiting, diarrhea, burning/pain in her stomach, and severe pain in her LLQ.  This started as vomiting with eating. Then, 3 days after this, she started having diarrhea, burning sensation in the epigastric region, and severe sharp, pressure and cramping in the  LLQ. She also had dizziness and weakness as well.   Dr. Richarda BladeAdamo prescribed cipro/Flagyl on 6/20 which the patient notes has not helped. She has taken it for 3 days total. She was prescribed Zofran and it has not helped. She last took it 3 days ago.  She stopped taking Protonix quit some time ago.   On ROS she endorses yellow vaginal d/c and vulvar pruritus. She denies fevers, chills, melana, hematochezia, hematemesis, or hematuria. She still been able to eat and drink. She has a normal urine output.  She needs a letter for work.  Social History: patient begins crying during the encounter, stating that she was told today that her and her family would be kicked out of the shelter on June 30.    ROS: All other systems reviewed and are negative.  Past Medical History Patient Active Problem List   Diagnosis Date Noted  . Abdominal pain, left lower quadrant 02/24/2016  . Viral gastroenteritis 02/23/2016  . GERD (gastroesophageal reflux disease) 02/04/2016  . Nausea with vomiting 02/04/2016  . Candidal intertrigo 02/04/2016  . 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 Dysmenorrhea 03/20/2015  . Menorrhagia 03/20/2015    Medications- reviewed and updated Current Outpatient Prescriptions  Medication Sig Dispense Refill  . ciprofloxacin (CIPRO) 500 MG tablet Take 1 tablet (500 mg total) by mouth 2 (two) times daily. 14 tablet 0  . clotrimazole-betamethasone (LOTRISONE) cream Apply 1 application topically 2 (two) times daily. 45 g 5  .  metoCLOPramide (REGLAN) 10 MG tablet Take 1 tablet (10 mg total) by mouth 4 (four) times daily. 30 tablet 0  . metroNIDAZOLE (FLAGYL) 500 MG tablet Take 1 tablet (500 mg total) by mouth 3 (three) times daily. 21 tablet 0  . pantoprazole (PROTONIX) 40 MG tablet Take 1 tablet (40 mg total) by mouth 2 (two) times daily. 60 tablet 1   No current facility-administered medications for this visit.    Objective: Office vital signs reviewed. BP 147/108 mmHg  Pulse 76  Temp(Src) 98.6 F (37 C) (Oral)  Wt 213 lb 3.2 oz (96.707 kg)  SpO2 100%  LMP 01/26/2016   Physical Examination:  General: Awake, alert, well- nourished, NAD initially, however then very upset during the encounter. ENMT:  Moist mucous membranes. Oropharynx clear without erythema or tonsillar exudate/hypertrophy Cardio: RRR, no m/r/g noted.  Pulm: No increased WOB.  CTAB, without wheezes, rhonchi or crackles noted.  GI: Positive bowel sounds, soft, nondistended, patient endorses pain in the left lower quadrant and right lower quadrant on examination, however with distraction there is no grimace with palpation. GYN:  External genitalia within normal limits.  Vaginal mucosa pink, moist, normal rugae.  Nonfriable cervix without lesions, no discharge or bleeding noted on speculum exam.  Bimanual exam revealed normal, nongravid uterus.  No cervical motion tenderness. No adnexal masses bilaterally.   Skin: dry, intact, no rashes or lesions  Wet prep: 1-5 WBC, few bacteria, negative for yeast, trichomoniasis, and clue cells.  Assessment/Plan: Abdominal pain, left lower quadrant The patient is presenting  with over one week of both epigastric burning, and left lower quadrant pain.  Most likely diagnoses include diverticulitis versus bacterial colitis versus somatic versus IBD versus ovarian etiology.  Given her diarrhea, this is less likely an ovarian etiology.  The patient has had significant weight loss, initially her weight was 219  pounds, is now 213 pounds.   -Advised the patient to continue the ciprofloxacin and Flagyl. -Discontinue Zofran as it is not been beneficial, start Reglan 10 mg every 6 hours as needed for nausea. -Advised patient to restart Protonix 40 mg twice a day, as I suspect her epigastric pain may be secondary to esophageal reflux versus PUD. -Patient has a CT of her abdomen and pelvis ordered for June 29 at 9:30. Advised the patient to keep this appointment as well as her appointment with Dr. abdominal later that day. -Discussed strict return precautions with the patient. -Additional he, given all of her social issues, provided the patient with additional resources of other shelters in the area that may be able to accept her and her family    Orders Placed This Encounter  Procedures  . POCT Wet Prep [facility]([place])    Meds ordered this encounter  Medications  . metoCLOPramide (REGLAN) 10 MG tablet    Sig: Take 1 tablet (10 mg total) by mouth 4 (four) times daily.    Dispense:  30 tablet    Refill:  0  . pantoprazole (PROTONIX) 40 MG tablet    Sig: Take 1 tablet (40 mg total) by mouth 2 (two) times daily.    Dispense:  60 tablet    Refill:  1    Joanna Puffrystal S. Dorsey PGY-2, [facility]

## 2016-03-01 NOTE — Patient Instructions (Signed)
Detener Zofran  Comience Reglan cada 6 horas segn sea necesario para las nuseas  Contine Ciprofloxacina y Flagyl.  Mantenga su cita para obtener una imagen abdominal y su cita con el Dr. Richarda BladeAdamo

## 2016-03-02 ENCOUNTER — Encounter (HOSPITAL_COMMUNITY): Payer: Self-pay

## 2016-03-02 ENCOUNTER — Emergency Department (HOSPITAL_COMMUNITY)
Admission: EM | Admit: 2016-03-02 | Discharge: 2016-03-02 | Disposition: A | Discharge status: Home / Self Care | Payer: Medicaid Other | Attending: Emergency Medicine | Admitting: Emergency Medicine

## 2016-03-02 ENCOUNTER — Emergency Department (HOSPITAL_COMMUNITY): Payer: Medicaid Other

## 2016-03-02 DIAGNOSIS — Z79899 Other long term (current) drug therapy: Secondary | ICD-10-CM | POA: Diagnosis not present

## 2016-03-02 DIAGNOSIS — B379 Candidiasis, unspecified: Secondary | ICD-10-CM | POA: Insufficient documentation

## 2016-03-02 DIAGNOSIS — B3731 Acute candidiasis of vulva and vagina: Secondary | ICD-10-CM

## 2016-03-02 DIAGNOSIS — R1012 Left upper quadrant pain: Secondary | ICD-10-CM | POA: Diagnosis present

## 2016-03-02 DIAGNOSIS — R109 Unspecified abdominal pain: Secondary | ICD-10-CM

## 2016-03-02 DIAGNOSIS — B373 Candidiasis of vulva and vagina: Secondary | ICD-10-CM

## 2016-03-02 LAB — CBC
HCT: 38.1 % (ref 36.0–46.0)
HEMOGLOBIN: 11.5 g/dL — AB (ref 12.0–15.0)
MCH: 22.7 pg — ABNORMAL LOW (ref 26.0–34.0)
MCHC: 30.2 g/dL (ref 30.0–36.0)
MCV: 75.1 fL — AB (ref 78.0–100.0)
PLATELETS: 299 10*3/uL (ref 150–400)
RBC: 5.07 MIL/uL (ref 3.87–5.11)
RDW: 14.8 % (ref 11.5–15.5)
WBC: 8.9 10*3/uL (ref 4.0–10.5)

## 2016-03-02 LAB — WET PREP, GENITAL
CLUE CELLS WET PREP: NONE SEEN
Sperm: NONE SEEN
TRICH WET PREP: NONE SEEN

## 2016-03-02 LAB — LIPASE, BLOOD: LIPASE: 33 U/L (ref 11–51)

## 2016-03-02 LAB — URINALYSIS, ROUTINE W REFLEX MICROSCOPIC
Bilirubin Urine: NEGATIVE
Glucose, UA: NEGATIVE mg/dL
Hgb urine dipstick: NEGATIVE
Ketones, ur: NEGATIVE mg/dL
LEUKOCYTES UA: NEGATIVE
Nitrite: NEGATIVE
PROTEIN: NEGATIVE mg/dL
Specific Gravity, Urine: 1.014 (ref 1.005–1.030)
pH: 6 (ref 5.0–8.0)

## 2016-03-02 LAB — PREGNANCY, URINE: PREG TEST UR: NEGATIVE

## 2016-03-02 LAB — COMPREHENSIVE METABOLIC PANEL
ALT: 25 U/L (ref 14–54)
ANION GAP: 6 (ref 5–15)
AST: 17 U/L (ref 15–41)
Albumin: 3.3 g/dL — ABNORMAL LOW (ref 3.5–5.0)
Alkaline Phosphatase: 55 U/L (ref 38–126)
BUN: 12 mg/dL (ref 6–20)
CALCIUM: 9 mg/dL (ref 8.9–10.3)
CHLORIDE: 105 mmol/L (ref 101–111)
CO2: 25 mmol/L (ref 22–32)
CREATININE: 0.74 mg/dL (ref 0.44–1.00)
Glucose, Bld: 124 mg/dL — ABNORMAL HIGH (ref 65–99)
Potassium: 4 mmol/L (ref 3.5–5.1)
Sodium: 136 mmol/L (ref 135–145)
Total Bilirubin: 0.5 mg/dL (ref 0.3–1.2)
Total Protein: 7 g/dL (ref 6.5–8.1)

## 2016-03-02 MED ORDER — ONDANSETRON HCL 4 MG/2ML IJ SOLN
4.0000 mg | Freq: Once | INTRAMUSCULAR | Status: AC
  Administered 2016-03-02: 4 mg via INTRAVENOUS
  Filled 2016-03-02: qty 2

## 2016-03-02 MED ORDER — ONDANSETRON 4 MG PO TBDP: 4.0000 mg | ORAL_TABLET | Freq: Three times a day (TID) | ORAL | Status: DC | PRN

## 2016-03-02 MED ORDER — IOPAMIDOL (ISOVUE-300) INJECTION 61%
INTRAVENOUS | Status: AC
  Filled 2016-03-02: qty 100

## 2016-03-02 MED ORDER — HYDROMORPHONE HCL 1 MG/ML IJ SOLN
1.0000 mg | Freq: Once | INTRAMUSCULAR | Status: AC
  Administered 2016-03-02: 1 mg via INTRAVENOUS
  Filled 2016-03-02: qty 1

## 2016-03-02 MED ORDER — ONDANSETRON 4 MG PO TBDP
4.0000 mg | ORAL_TABLET | Freq: Once | ORAL | Status: AC
  Administered 2016-03-02: 4 mg via ORAL
  Filled 2016-03-02: qty 1

## 2016-03-02 MED ORDER — MORPHINE SULFATE (PF) 4 MG/ML IV SOLN
4.0000 mg | Freq: Once | INTRAVENOUS | Status: AC
  Administered 2016-03-02: 4 mg via INTRAVENOUS
  Filled 2016-03-02: qty 1

## 2016-03-02 MED ORDER — MICONAZOLE NITRATE 2 % EX CREA: 1.0000 "application " | TOPICAL_CREAM | Freq: Every day | CUTANEOUS | Status: DC

## 2016-03-02 MED ORDER — SODIUM CHLORIDE 0.9 % IV BOLUS (SEPSIS)
1000.0000 mL | Freq: Once | INTRAVENOUS | Status: AC
  Administered 2016-03-02: 1000 mL via INTRAVENOUS

## 2016-03-02 NOTE — Discharge Instructions (Signed)
Take your medications as prescribed for nausea. I recommend drinking at least six 8 ounce glasses of water daily to remain hydrated at home. I also recommend eating a bland diet for the next few days until your symptoms have improved. Follow-up with your primary care provider in the next 3-4 days if your symptoms have not improved. Return to the emergency department if symptoms worsen or new onset of fever, vomiting blood, unable to keep fluids down, blood in stool, chest pain, difficulty breathing.

## 2016-03-02 NOTE — ED Provider Notes (Signed)
CSN: [id number]     Arrival date & time 03/02/16  1425 History   First MD Initiated Contact with Patient 03/02/16 1724     Chief Complaint  Patient presents with  . Abdominal Pain     (Consider location/radiation/quality/duration/timing/severity/associated sxs/prior Treatment) HPI   Patient is a 35 year old female with past medical history of gastritis who presents the ED with complaint of worsening abdominal pain. Patient reports she has had worsening constant aching/cramping abdominal pain to her epigastric and left upper quadrant region for the past few weeks. Denies any aggravating or alleviating factors. Endorses associated nausea, NBNB vomiting, nonbloody diarrhea, dysuria and vaginal d/c (yellow). Patient states she has been unable to keep anything down for the past few days. She notes she was seen by her PCP a few weeks ago and was diagnosed with a viral GI bug. She states she called her PCP a week ago reported that her symptoms had worsened and was prescribed Flagyl and Cipro. Patient states she has been taking the antibiotics as prescribed for the past 5 days without improvement of symptoms. Denies fever, chills, headache, cough or shortness of breath, chest pain, hematemesis, hematuria, vaginal bleeding. LMP 01/24/16. Denies history of abdominal surgeries. Denies any known sick contacts.  Patient is Spanish-speaking and interpreter was used during interview and evaluation.  Past Medical History  Diagnosis Date  . Gastritis   . Ovarian cyst    Past Surgical History  Procedure Laterality Date  . Wisdom tooth extraction     Family History  Problem Relation Age of Onset  . Asthma Mother   . Diabetes Father    Social History  Substance Use Topics  . Smoking status: Never Smoker   . Smokeless tobacco: Never Used  . Alcohol Use: No   OB History    Gravida Para Term Preterm AB TAB SAB Ectopic Multiple Living   0 0 0 0 0 0 2     Review of Systems  Gastrointestinal:  Positive for nausea, vomiting, abdominal pain and diarrhea.  Genitourinary: Positive for dysuria and vaginal discharge.  All other systems reviewed and are negative.     Allergies  Review of patient's allergies indicates no known allergies.  Home Medications   Prior to Admission medications   Medication Sig Start Date End Date Taking? Authorizing Provider  ciprofloxacin (CIPRO) 500 MG tablet Take 1 tablet (500 mg total) by mouth 2 (two) times daily. 02/24/16  Yes Abram Sander, MD  metroNIDAZOLE (FLAGYL) 500 MG tablet Take 1 tablet (500 mg total) by mouth 3 (three) times daily. 02/24/16  Yes Abram Sander, MD  clotrimazole-betamethasone (LOTRISONE) cream Apply 1 application topically 2 (two) times daily. Patient not taking: Reported on 03/02/2016 02/04/16   Abram Sander, MD  metoCLOPramide (REGLAN) 10 MG tablet Take 1 tablet (10 mg total) by mouth 4 (four) times daily. Patient not taking: Reported on 03/02/2016 03/01/16   Joanna Puff, MD  miconazole (MICATIN) 2 % cream Apply 1 application topically daily. Apply cream once daily for the next 7 nights. 03/02/16   Barrett Henle, PA-C  ondansetron (ZOFRAN ODT) 4 MG disintegrating tablet Take 1 tablet (4 mg total) by mouth every 8 (eight) hours as needed for nausea or vomiting. 03/02/16   Barrett Henle, PA-C  pantoprazole (PROTONIX) 40 MG tablet Take 1 tablet (40 mg total) by mouth 2 (two) times daily. Patient not taking: Reported on 03/02/2016 03/01/16   Joanna Puff, MD   BP  104/76 mmHg  Pulse 70  Temp(Src) 98.6 F (37 C) (Oral)  Resp 16  SpO2 98%  LMP 01/26/2016 Physical Exam  Constitutional: She is oriented to person, place, and time. She appears well-developed and well-nourished. No distress.  HENT:  Head: Normocephalic and atraumatic.  Mouth/Throat: Oropharynx is clear and moist. No oropharyngeal exudate.  Eyes: Conjunctivae and EOM are normal. Pupils are equal, round, and reactive to light. Right eye  exhibits no discharge. Left eye exhibits no discharge. No scleral icterus.  Neck: Normal range of motion. Neck supple.  Cardiovascular: Normal rate, regular rhythm, normal heart sounds and intact distal pulses.   Pulmonary/Chest: Effort normal and breath sounds normal. No respiratory distress. She has no wheezes. She has no rales. She exhibits no tenderness.  Abdominal: Soft. Bowel sounds are normal. She exhibits no distension and no mass. There is tenderness (mild tenderness over epigastric, LUQ, RLQ and suprapubic region). There is no rigidity, no rebound, no guarding, no tenderness at McBurney's point and negative Murphy's sign.  Musculoskeletal: Normal range of motion. She exhibits no edema.  Lymphadenopathy:    She has no cervical adenopathy.  Neurological: She is alert and oriented to person, place, and time.  Skin: Skin is warm and dry. She is not diaphoretic.  Nursing note and vitals reviewed.  Pelvic exam: normal external genitalia, vulva, vagina, cervix, uterus and adnexa, VULVA: normal appearing vulva with no masses, tenderness or lesions, VAGINA: normal appearing vagina with normal color and discharge, no lesions, vaginal discharge - clear, mucoid and scant, CERVIX: normal appearing cervix without discharge or lesions, WET MOUNT done - results: yeast and WBCs, DNA probe for chlamydia and GC obtained, UTERUS: uterus is normal size, shape, consistency and nontender, ADNEXA: normal adnexa in size, nontender and no masses, exam chaperoned by female nurse.   ED Course  Procedures (including critical care time) Labs Review Labs Reviewed  WET PREP, GENITAL - Abnormal; Notable for the following:    Yeast Wet Prep HPF POC PRESENT (*)    WBC, Wet Prep HPF POC MANY (*)    All other components within normal limits  COMPREHENSIVE METABOLIC PANEL - Abnormal; Notable for the following:    Glucose, Bld 124 (*)    Albumin 3.3 (*)    All other components within normal limits  CBC - Abnormal;  Notable for the following:    Hemoglobin 11.5 (*)    MCV 75.1 (*)    MCH 22.7 (*)    All other components within normal limits  LIPASE, BLOOD  URINALYSIS, ROUTINE W REFLEX MICROSCOPIC (NOT AT [facility])  PREGNANCY, URINE  GC/CHLAMYDIA PROBE AMP ([place]) NOT AT [facility]    Imaging Review Ct Abdomen Pelvis W Contrast  03/02/2016  CLINICAL DATA:  Left upper quadrant pain for 2 days, nausea and vomiting, frequent urination EXAM: CT ABDOMEN AND PELVIS WITH CONTRAST TECHNIQUE: Multidetector CT imaging of the abdomen and pelvis was performed using the standard protocol following bolus administration of intravenous contrast. CONTRAST:  100 cc Isovue COMPARISON:  None. FINDINGS: Lower chest:  The lung bases are unremarkable. Hepatobiliary: Enhanced liver is unremarkable. No calcified gallstones are noted within gallbladder. Pancreas: Enhanced pancreas is unremarkable. Spleen: Enhanced spleen is unremarkable. Adrenals/Urinary Tract: No adrenal gland mass. Enhanced kidneys are symmetrical in size. No hydronephrosis or hydroureter. Delayed renal images shows bilateral renal symmetrical excretion. The urinary bladder is empty limiting its assessment. Stomach/Bowel: No gastric outlet obstruction. No small bowel obstruction. No thickened or dilated small bowel loops. Normal appendix. No  pericecal inflammation. Some colonic gas and stool noted in right colon and transverse colon. No distal colitis or diverticulitis. No distal colonic obstruction. Vascular/Lymphatic: No aortic aneurysm. No retroperitoneal or mesenteric adenopathy. Reproductive: The uterus is anteflexed. Small amount of nonspecific fluid noted within uterus. There is a left ovarian cyst measures 2.6 cm. No pelvic free fluid is noted. Other: No ascites or free abdominal air.  No inguinal adenopathy. Musculoskeletal: No destructive bony lesions are noted. Sagittal images of the spine shows alignment, disc spaces and vertebral body heights to be preserved.  IMPRESSION: 1. No acute inflammatory process within abdomen. 2. No small bowel obstruction. 3. No hydronephrosis or hydroureter. 4. No pericecal inflammation.  Normal appendix. 5. There is a left ovarian cyst measures 2.6 cm. No pelvic free fluid. Electronically Signed   By: Natasha MeadLiviu  Pop M.D.   On: 03/02/2016 20:15   I have personally reviewed and evaluated these images and lab results as part of my medical decision-making.   EKG Interpretation None      MDM   Final diagnoses:  Abdominal pain, unspecified abdominal location  Vaginal yeast infection    Patient presents with abdominal pain with associated nausea, vomiting and diarrhea. She notes she has been seen by her PCP and was recently started on Cipro and Flagyl, she has been taking antibiotics for the past 5 days without improvement. VSS. Exam revealed mild tenderness over epigastric, left upper quadrant, right lower quadrant and suprapubic region. No peritoneal signs. Remaining exam unremarkable. Pelvic exam unremarkable, no CMT or adnexal tenderness. Patient given IV fluids, Zofran and morphine. On reevaluation patient reports her pain has mildly improved. Will order CT abdominal scan for further evaluation due to concern for appendicitis versus diverticulitis. CT abdomen revealed no acute inflammatory process, small bowel obstruction, hydronephrosis, pericecal inflammation; 2.6 cm left ovarian cysts. Wet prep positive for yeast, we'll discharge patient home with miconazole. On reevaluation patient reports her pain has improved. Patient able to tolerate by mouth in the ED. Suspect patient's symptoms are likely due to to viral gastroenteritis. Plan to discharge patient home with symptomatic treatment. Advised patient to follow up with her PCP as needed within the next 4-5 days. Discussed return precautions with patient.    Satira Sarkicole Elizabeth DukedomNadeau, [place] JerseyPA-C 03/03/16 0104  Lavera Guiseana Duo Liu, MD 03/03/16 1359

## 2016-03-02 NOTE — ED Notes (Signed)
This RN in room to escort pt to car and pt vomiting. Joni ReiningNicole PA notified and gave verbal order for zofran odt

## 2016-03-02 NOTE — ED Notes (Signed)
EDP at bedside, feels relief with ODT zofran, DC per provider. Pt being wheeled out.

## 2016-03-02 NOTE — ED Notes (Addendum)
Per Pt, Pt has had Left Upper Quadrant pain with burning that comes around the anterior of her stomach. Pt reports back pain with the abdomen. Reports have increasing in urination, vomiting, and diarrhea. Pt has an appt with GI on 6/29, but reports being unable to wait for the appt. Patient reports being recently homeless and reports trying to kill herself one month ago. Pt denies specific thoughts today, but is tearful and reports she has so many stresses that she is trying to work out right. Pt is unable to work due to sickness.

## 2016-03-02 NOTE — ED Notes (Signed)
This RN reviewed d/c instructions with the interpreter phone and the pt verbalized understanding of d/c instructions. Pt alert and oriented x4. Pt stable and NAD.

## 2016-03-03 LAB — GC/CHLAMYDIA PROBE AMP (~~LOC~~) NOT AT ARMC
Chlamydia: NEGATIVE
NEISSERIA GONORRHEA: NEGATIVE

## 2016-03-04 ENCOUNTER — Ambulatory Visit
Admission: RE | Admit: 2016-03-04 | Discharge: 2016-03-04 | Disposition: A | Discharge status: Home / Self Care | Payer: Medicaid Other | Source: Ambulatory Visit | Attending: Family Medicine | Admitting: Family Medicine

## 2016-03-04 ENCOUNTER — Encounter: Payer: Self-pay | Admitting: Family Medicine

## 2016-03-04 ENCOUNTER — Ambulatory Visit (INDEPENDENT_AMBULATORY_CARE_PROVIDER_SITE_OTHER): Payer: Medicaid Other | Admitting: Family Medicine

## 2016-03-04 VITALS — BP 146/101 | HR 82 | Temp 98.7°F | Ht 61.0 in | Wt 210.8 lb

## 2016-03-04 DIAGNOSIS — R112 Nausea with vomiting, unspecified: Secondary | ICD-10-CM

## 2016-03-04 DIAGNOSIS — B372 Candidiasis of skin and nail: Secondary | ICD-10-CM

## 2016-03-04 DIAGNOSIS — R1032 Left lower quadrant pain: Secondary | ICD-10-CM

## 2016-03-04 LAB — CBC WITH DIFFERENTIAL/PLATELET
Basophils Absolute: 0 cells/uL (ref 0–200)
Basophils Relative: 0 %
Eosinophils Absolute: 230 cells/uL (ref 15–500)
Eosinophils Relative: 2 %
HCT: 37.7 % (ref 35.0–45.0)
Hemoglobin: 12 g/dL (ref 11.7–15.5)
Lymphocytes Relative: 39 %
Lymphs Abs: 4485 cells/uL — ABNORMAL HIGH (ref 850–3900)
MCH: 24 pg — ABNORMAL LOW (ref 27.0–33.0)
MCHC: 31.8 g/dL — ABNORMAL LOW (ref 32.0–36.0)
MCV: 75.2 fL — ABNORMAL LOW (ref 80.0–100.0)
MPV: 9.4 fL (ref 7.5–12.5)
Monocytes Absolute: 575 cells/uL (ref 200–950)
Monocytes Relative: 5 %
Neutro Abs: 6210 cells/uL (ref 1500–7800)
Neutrophils Relative %: 54 %
Platelets: 346 10*3/uL (ref 140–400)
RBC: 5.01 MIL/uL (ref 3.80–5.10)
RDW: 15.2 % — ABNORMAL HIGH (ref 11.0–15.0)
WBC: 11.5 10*3/uL — ABNORMAL HIGH (ref 3.8–10.8)

## 2016-03-04 LAB — COMPLETE METABOLIC PANEL WITH GFR
ALT: 19 U/L (ref 6–29)
AST: 13 U/L (ref 10–30)
Albumin: 3.7 g/dL (ref 3.6–5.1)
Alkaline Phosphatase: 56 U/L (ref 33–115)
BILIRUBIN TOTAL: 0.4 mg/dL (ref 0.2–1.2)
BUN: 13 mg/dL (ref 7–25)
CALCIUM: 9 mg/dL (ref 8.6–10.2)
CHLORIDE: 102 mmol/L (ref 98–110)
CO2: 25 mmol/L (ref 20–31)
CREATININE: 0.7 mg/dL (ref 0.50–1.10)
GFR, Est Non African American: >89 mL/min (ref >=60)
Glucose, Bld: 82 mg/dL (ref 65–99)
Potassium: 4.3 mmol/L (ref 3.5–5.3)
Sodium: 139 mmol/L (ref 135–146)
TOTAL PROTEIN: 6.9 g/dL (ref 6.1–8.1)

## 2016-03-04 MED ORDER — MICONAZOLE NITRATE 2 % EX CREA: 1.0000 "application " | TOPICAL_CREAM | Freq: Two times a day (BID) | CUTANEOUS | Status: DC

## 2016-03-04 NOTE — Patient Instructions (Signed)
Vamos a llamar con la cita con la especialista en 3-5 dias

## 2016-03-05 ENCOUNTER — Telehealth: Payer: Self-pay | Admitting: Internal Medicine

## 2016-03-05 LAB — HIV ANTIBODY (ROUTINE TESTING W REFLEX): HIV: NONREACTIVE

## 2016-03-05 NOTE — Assessment & Plan Note (Signed)
Persistent n/v/d, abdominal pain for 1 month, still with benign exam, negative CT in ED - refer to GI - stool studies ordered, pt to drop off samples when able

## 2016-03-05 NOTE — Progress Notes (Signed)
Subjective: CC: f/u abdominal pain HPI: Patient is a 35 y.o. female with a past medical history of GERD presenting to clinic today for f/u abdominal pain.  The patient has had 30 days of vomiting, diarrhea, burning/pain in her stomach, and severe pain in her LLQ.  This started as vomiting with eating. Then, 3 days after this, she started having diarrhea, burning sensation in the epigastric region, and severe sharp, pressure and cramping in the  LLQ. She also had dizziness and weakness as well.   I prescribed cipro/Flagyl on 6/20 which the patient notes has not helped. She has taken it for 3 days total. She was prescribed Zofran and it has not helped. She last took it 3 days ago.  She stopped taking Protonix quit some time ago.   On ROS she endorses yellow vaginal d/c and vulvar pruritus. She denies fevers, chills, melana, hematochezia, hematemesis, or hematuria. She still been able to eat and drink. She has a normal urine output.  She needs a letter for work.  Social History: patient begins crying during the encounter, stating that she was told today that her and her family would be kicked out of the shelter on June 30.    ROS: All other systems reviewed and are negative.  Past Medical History Patient Active Problem List   Diagnosis Date Noted  . Abdominal pain, left lower quadrant 02/24/2016  . GERD (gastroesophageal reflux disease) 02/04/2016  . Nausea with vomiting 02/04/2016  . Candidal intertrigo 02/04/2016  . Dysmenorrhea 03/20/2015  . Menorrhagia 03/20/2015    Medications- reviewed and updated Current Outpatient Prescriptions  Medication Sig Dispense Refill  . ciprofloxacin (CIPRO) 500 MG tablet Take 1 tablet (500 mg total) by mouth 2 (two) times daily. 14 tablet 0  . clotrimazole-betamethasone (LOTRISONE) cream Apply 1 application topically 2 (two) times daily. (Patient not taking: Reported on 03/02/2016) 45 g 5  . metoCLOPramide (REGLAN) 10 MG tablet Take 1 tablet (10  mg total) by mouth 4 (four) times daily. (Patient not taking: Reported on 03/02/2016) 30 tablet 0  . metroNIDAZOLE (FLAGYL) 500 MG tablet Take 1 tablet (500 mg total) by mouth 3 (three) times daily. 21 tablet 0  . miconazole (MICATIN) 2 % cream Apply 1 application topically 2 (two) times daily. 28.35 g 0  . ondansetron (ZOFRAN ODT) 4 MG disintegrating tablet Take 1 tablet (4 mg total) by mouth every 8 (eight) hours as needed for nausea or vomiting. 10 tablet 0  . pantoprazole (PROTONIX) 40 MG tablet Take 1 tablet (40 mg total) by mouth 2 (two) times daily. (Patient not taking: Reported on 03/02/2016) 60 tablet 1   No current facility-administered medications for this visit.    Objective: Office vital signs reviewed. BP 146/101 mmHg  Pulse 82  Temp(Src) 98.7 F (37.1 C) (Oral)  Ht 5\' 1"  (1.549 m)  Wt 210 lb 12.8 oz (95.618 kg)  BMI 39.85 kg/m2  LMP 01/26/2016   Physical Examination:  General: Awake, alert, well- nourished, NAD initially, however then very upset during the encounter. ENMT:  Moist mucous membranes. Oropharynx clear without erythema or tonsillar exudate/hypertrophy Cardio: RRR, no m/r/g noted.  Pulm: No increased WOB.  CTAB, without wheezes, rhonchi or crackles noted.  GI: Positive bowel sounds, soft, nondistended, patient endorses pain in the left lower quadrant and right lower quadrant on examination, however with distraction there is no grimace with palpation. GYN:  External genitalia within normal limits.  Vaginal mucosa pink, moist, normal rugae.  Nonfriable cervix without  lesions, no discharge or bleeding noted on speculum exam.  Bimanual exam revealed normal, nongravid uterus.  No cervical motion tenderness. No adnexal masses bilaterally.   Skin: dry, intact, no rashes or lesions  Wet prep: 1-5 WBC, few bacteria, negative for yeast, trichomoniasis, and clue cells.  Assessment/Plan: Nausea with vomiting Persistent n/v/d, abdominal pain for 1 month, still with benign  exam, negative CT in ED - refer to GI - stool studies ordered, pt to drop off samples when able    Orders Placed This Encounter  Procedures  . Ova and parasite examination    Standing Status: Future     Number of Occurrences:      Standing Expiration Date: 03/04/2017  . Fecal leukocytes    Standing Status: Future     Number of Occurrences:      Standing Expiration Date: 03/04/2017  . Stool culture    Standing Status: Future     Number of Occurrences:      Standing Expiration Date: 03/04/2017  . CBC with Differential/Platelet  . COMPLETE METABOLIC PANEL WITH GFR  . HIV antibody  . Gastrointestinal Pathogen Panel PCR    Standing Status: Future     Number of Occurrences:      Standing Expiration Date: 03/04/2017  . Ambulatory referral to Gastroenterology    Referral Priority:  Urgent    Referral Type:  Consultation    Referral Reason:  Specialty Services Required    Number of Visits Requested:  1    Meds ordered this encounter  Medications  . miconazole (MICATIN) 2 % cream    Sig: Apply 1 application topically 2 (two) times daily.    Dispense:  28.35 g    Refill:  0   Beverely LowElena Raeghan Demeter, MD, MPH [facility] PGY-3 03/05/2016 5:34 PM

## 2016-03-05 NOTE — Telephone Encounter (Signed)
Pt scheduled to see Brien MatesJesscia Zehr PA 03/10/16@2pm . Please notify pt of appt.

## 2016-03-05 NOTE — Telephone Encounter (Signed)
Left voicemail for patient to let her know of appointment.

## 2016-03-10 ENCOUNTER — Ambulatory Visit: Payer: Self-pay | Admitting: Gastroenterology

## 2016-03-10 MED ORDER — IOPAMIDOL (ISOVUE-300) INJECTION 61%
100.0000 mL | Freq: Once | INTRAVENOUS | Status: AC | PRN
  Administered 2016-03-02: 100 mL via INTRAVENOUS

## 2016-03-16 ENCOUNTER — Other Ambulatory Visit: Payer: Self-pay

## 2016-03-16 ENCOUNTER — Ambulatory Visit (INDEPENDENT_AMBULATORY_CARE_PROVIDER_SITE_OTHER): Payer: Medicaid Other | Admitting: Gastroenterology

## 2016-03-16 ENCOUNTER — Other Ambulatory Visit (INDEPENDENT_AMBULATORY_CARE_PROVIDER_SITE_OTHER): Payer: Medicaid Other

## 2016-03-16 ENCOUNTER — Encounter: Payer: Self-pay | Admitting: Gastroenterology

## 2016-03-16 VITALS — BP 128/80 | HR 72 | Ht 60.5 in | Wt 211.1 lb

## 2016-03-16 DIAGNOSIS — R1012 Left upper quadrant pain: Secondary | ICD-10-CM

## 2016-03-16 DIAGNOSIS — R110 Nausea: Secondary | ICD-10-CM

## 2016-03-16 DIAGNOSIS — R112 Nausea with vomiting, unspecified: Secondary | ICD-10-CM | POA: Insufficient documentation

## 2016-03-16 LAB — H. PYLORI ANTIBODY, IGG: H PYLORI IGG: POSITIVE — AB

## 2016-03-16 MED ORDER — PANTOPRAZOLE SODIUM 40 MG PO TBEC: 40.0000 mg | DELAYED_RELEASE_TABLET | Freq: Every day | ORAL | Status: DC

## 2016-03-16 MED ORDER — BIS SUBCIT-METRONID-TETRACYC 140-125-125 MG PO CAPS: 3.0000 | ORAL_CAPSULE | Freq: Three times a day (TID) | ORAL | Status: DC

## 2016-03-16 MED ORDER — PANTOPRAZOLE SODIUM 40 MG PO TBEC: 40.0000 mg | DELAYED_RELEASE_TABLET | Freq: Two times a day (BID) | ORAL | Status: DC

## 2016-03-16 NOTE — Progress Notes (Signed)
Agree with assessment and plan as outlined. If symptoms persist may consider EGD

## 2016-03-16 NOTE — Progress Notes (Signed)
03/16/2016 Deborah ChinaSandra Chandler [id number] Mar 24, 1981   HISTORY OF PRESENT ILLNESS:  This is a 35 year old female who is new to our office and was referred here by her PCP, Dr. Richarda BladeAdamo, for evaluation of abdominal pain.  Patient speaks primarily Spanish so the visit and conversation was performed via interpreter.  She tells me that for the past 3 weeks she has been experiencing left upper quadrant abdominal pain and some epigastric abdominal pain as well. She describes this as burning and also sometimes a pressure that comes and goes. She'll still has nausea and complains of bad breath. She denies any similar symptoms in the past. She has CT scan of abdomen and pelvis with contrast on June 27 that showed only a left ovarian cyst measuring 2.6 cm. CBC, CMP, urinalysis, lipase were unremarkable.  It appears that she was advised to begin a daily PPI, but has not done so.   Past Medical History  Diagnosis Date  . Gastritis   . Ovarian cyst   . Anemia     ?   Past Surgical History  Procedure Laterality Date  . Wisdom tooth extraction      one    reports that she has never smoked. She has never used smokeless tobacco. She reports that she does not drink alcohol or use illicit drugs. family history includes Asthma in her mother; Diabetes in her father, paternal aunt, and paternal grandmother. No Known Allergies    Outpatient Encounter Prescriptions as of 03/16/2016  Medication Sig  . metroNIDAZOLE (FLAGYL) 500 MG tablet Take 1 tablet (500 mg total) by mouth 3 (three) times daily.  . pantoprazole (PROTONIX) 40 MG tablet Take 1 tablet (40 mg total) by mouth daily.  . [DISCONTINUED] ciprofloxacin (CIPRO) 500 MG tablet Take 1 tablet (500 mg total) by mouth 2 (two) times daily.  . [DISCONTINUED] clotrimazole-betamethasone (LOTRISONE) cream Apply 1 application topically 2 (two) times daily. (Patient not taking: Reported on 03/02/2016)  . [DISCONTINUED] metoCLOPramide (REGLAN) 10 MG tablet Take 1  tablet (10 mg total) by mouth 4 (four) times daily. (Patient not taking: Reported on 03/02/2016)  . [DISCONTINUED] miconazole (MICATIN) 2 % cream Apply 1 application topically 2 (two) times daily.  . [DISCONTINUED] ondansetron (ZOFRAN ODT) 4 MG disintegrating tablet Take 1 tablet (4 mg total) by mouth every 8 (eight) hours as needed for nausea or vomiting.  . [DISCONTINUED] pantoprazole (PROTONIX) 40 MG tablet Take 1 tablet (40 mg total) by mouth 2 (two) times daily. (Patient not taking: Reported on 03/02/2016)   No facility-administered encounter medications on file as of 03/16/2016.     REVIEW OF SYSTEMS  : All other systems reviewed and negative except where noted in the History of Present Illness.   PHYSICAL EXAM: BP 128/80 mmHg  Pulse 72  Ht 5' 0.5" (1.537 m)  Wt 211 lb 2 oz (95.766 kg)  BMI 40.54 kg/m2  LMP 03/06/2016 General: Well developed Hispanic female in no acute distress Head: Normocephalic and atraumatic Eyes:  Sclerae anicteric, conjunctiva pink. Ears: Normal auditory acuity Lungs: Clear throughout to auscultation Heart: Regular rate and rhythm Abdomen: Soft, non-distended.  Normal bowel sounds.  Mild epigastric and LUQ TTP. Musculoskeletal: Symmetrical with no gross deformities  Skin: No lesions on visible extremities Extremities: No edema  Neurological: Alert oriented x 4, grossly non-focal Psychological:  Alert and cooperative. Normal mood and affect  ASSESSMENT AND PLAN: -35 year old female with epigastric and left upper quadrant abdominal pain with nausea for the past 3 weeks. Pain is  described as burning. CT scan of the abdomen and pelvis with contrast unremarkable. She has not been treated with anything at this point. We'll check her for H. pylori with serologies (IgG and IgM). If positive will treat, but in the interim I would like to place her on pantoprazole 40 mg daily to see if this helps her symptoms.  CC:  Abram Sander, MD

## 2016-03-16 NOTE — Patient Instructions (Signed)
Please go to the basement level to have your labs drawn.   [facility] [address] for Pantoprazole sodium 40 mg.

## 2016-03-17 ENCOUNTER — Telehealth: Payer: Self-pay | Admitting: Gastroenterology

## 2016-03-17 NOTE — Telephone Encounter (Signed)
These providers are out of the office the rest of the week.

## 2016-03-17 NOTE — Telephone Encounter (Signed)
Please ask Shanda BumpsJessica or Dr. Adela LankArmbruster tomorrow.

## 2016-03-17 NOTE — Telephone Encounter (Signed)
10 days of Pylera

## 2016-03-17 NOTE — Telephone Encounter (Signed)
Doc of the Day This patient is being treated for H. Pylori by Doug SouJessica Zehr, PA. The original Pylera Rx is for 14 days. Pylera comes in boxes and covers 10 days. The pharmacy will not break a box to take out 4 days worth of the medication. Is it okay to change the Rx to a 10 day course of Pylera?

## 2016-03-18 ENCOUNTER — Other Ambulatory Visit: Payer: Self-pay

## 2016-03-18 MED ORDER — OMEPRAZOLE 20 MG PO CPDR
20.0000 mg | DELAYED_RELEASE_CAPSULE | Freq: Two times a day (BID) | ORAL | Status: DC
Start: 2016-03-18 — End: 2016-04-09

## 2016-03-18 NOTE — Telephone Encounter (Signed)
New Rx to the pharmacy.

## 2016-03-18 NOTE — Telephone Encounter (Signed)
Pharmacy and patient notified. She will take the packs of pill from the Pylera as directed. She will take Pantoprazole twice a day with the packs until finished. She will then take take pantoprazole once daily. Better to take it at least 30 minutes before her first meal of the day.

## 2016-03-18 NOTE — Telephone Encounter (Signed)
Its probably not the pantoprazole but is likely the antibiotics however ok to change to omeprazole

## 2016-03-18 NOTE — Telephone Encounter (Signed)
DOC of the Day This patient has a difficult situation. She saw J.Zehr. Has positive H. Pylori test. Treatment had to be changed from 14 days to 10 days due to insurance and pharmacy not being able to "break a box".  She feels the pantoprazole makes her dizzy and nauseous. She would like to not take it. May I change her to Omeprazole? This treatment is going to be difficult for her I think, as I am not sure how well hydrated she can stay during the day. She sleeps in a shelter at night. Has to leave the shelter from 8:00 am to 3:30 pm every day.  Advise on the PPI. Thank you.

## 2016-03-24 ENCOUNTER — Telehealth: Payer: Self-pay | Admitting: Gastroenterology

## 2016-03-24 ENCOUNTER — Encounter: Payer: Self-pay | Admitting: Gastroenterology

## 2016-03-24 NOTE — Telephone Encounter (Signed)
Patient is calling to report she is having nausea, vomiting and headaches, dizziness. Spoke with her via interpreter. She states she vomited X 1 yesterday. She is worried about taking "so many pills."  Reassured patient. She is drinking fluids and voiding well. She states [facility] lives in shelter [facility] [employer] [facility]. She drinks water and gator aid.

## 2016-03-25 NOTE — Telephone Encounter (Signed)
Sounds ok to me.  The medication is necessary to get rid of the bacteria and is only for 2 weeks.  Some of the medications can cause nausea as a side effect but it is important that she continues them and completes the course so that we can make sure the Deborah MajorsHpylori is gone.  Take medication with food and see if that helps reduce the nausea.

## 2016-03-25 NOTE — Telephone Encounter (Signed)
Patient is aware 

## 2016-04-07 ENCOUNTER — Emergency Department (HOSPITAL_COMMUNITY)
Admission: EM | Admit: 2016-04-07 | Discharge: 2016-04-08 | Disposition: A | Discharge status: Home / Self Care | Payer: Medicaid Other | Attending: Emergency Medicine | Admitting: Emergency Medicine

## 2016-04-07 ENCOUNTER — Encounter (HOSPITAL_COMMUNITY): Payer: Self-pay | Admitting: *Deleted

## 2016-04-07 ENCOUNTER — Emergency Department (HOSPITAL_COMMUNITY): Payer: Medicaid Other

## 2016-04-07 DIAGNOSIS — A048 Other specified bacterial intestinal infections: Secondary | ICD-10-CM

## 2016-04-07 DIAGNOSIS — R059 Cough, unspecified: Secondary | ICD-10-CM

## 2016-04-07 DIAGNOSIS — R0602 Shortness of breath: Secondary | ICD-10-CM | POA: Diagnosis not present

## 2016-04-07 DIAGNOSIS — R05 Cough: Secondary | ICD-10-CM | POA: Insufficient documentation

## 2016-04-07 DIAGNOSIS — A045 Campylobacter enteritis: Secondary | ICD-10-CM | POA: Insufficient documentation

## 2016-04-07 LAB — CBC
HCT: 37.6 % (ref 36.0–46.0)
Hemoglobin: 11.9 g/dL — ABNORMAL LOW (ref 12.0–15.0)
MCH: 24.1 pg — AB (ref 26.0–34.0)
MCHC: 31.6 g/dL (ref 30.0–36.0)
MCV: 76.3 fL — AB (ref 78.0–100.0)
PLATELETS: 303 10*3/uL (ref 150–400)
RBC: 4.93 MIL/uL (ref 3.87–5.11)
RDW: 15.3 % (ref 11.5–15.5)
WBC: 10.8 10*3/uL — AB (ref 4.0–10.5)

## 2016-04-07 LAB — COMPREHENSIVE METABOLIC PANEL
ALK PHOS: 60 U/L (ref 38–126)
ALT: 23 U/L (ref 14–54)
AST: 18 U/L (ref 15–41)
Albumin: 3.3 g/dL — ABNORMAL LOW (ref 3.5–5.0)
Anion gap: 9 (ref 5–15)
BUN: 12 mg/dL (ref 6–20)
CHLORIDE: 105 mmol/L (ref 101–111)
CO2: 22 mmol/L (ref 22–32)
CREATININE: 0.7 mg/dL (ref 0.44–1.00)
Calcium: 8.8 mg/dL — ABNORMAL LOW (ref 8.9–10.3)
GFR calc Af Amer: >60 mL/min (ref >60)
Glucose, Bld: 133 mg/dL — ABNORMAL HIGH (ref 65–99)
Potassium: 3.9 mmol/L (ref 3.5–5.1)
Sodium: 136 mmol/L (ref 135–145)
Total Bilirubin: 0.5 mg/dL (ref 0.3–1.2)
Total Protein: 6.8 g/dL (ref 6.5–8.1)

## 2016-04-07 LAB — LIPASE, BLOOD: LIPASE: 43 U/L (ref 11–51)

## 2016-04-07 MED ORDER — ONDANSETRON 4 MG PO TBDP
4.0000 mg | ORAL_TABLET | Freq: Once | ORAL | Status: AC | PRN
  Administered 2016-04-07: 4 mg via ORAL

## 2016-04-07 MED ORDER — ONDANSETRON 4 MG PO TBDP
ORAL_TABLET | ORAL | Status: AC
  Filled 2016-04-07: qty 1

## 2016-04-07 MED ORDER — IPRATROPIUM-ALBUTEROL 0.5-2.5 (3) MG/3ML IN SOLN
3.0000 mL | Freq: Once | RESPIRATORY_TRACT | Status: AC
  Administered 2016-04-07: 3 mL via RESPIRATORY_TRACT
  Filled 2016-04-07: qty 3

## 2016-04-07 NOTE — ED Triage Notes (Signed)
Pt c/o cough, shortness of breath, n/v, actively vomiting in triage. Pt coughs so hard it causes her to gag and vomit. Pt reports abdominal pain. Symptoms have been going on for three months but today has been the worse episode. Pt sounds like she has stridor but when she answers questions she answers them in completely in no respiratory distress. Daughter at bedside states when her mom gets stressed out she has these symptoms. Family lives in a shelter which is putting a lot of stress on the patient.

## 2016-04-08 MED ORDER — BENZONATATE 100 MG PO CAPS: 100.0000 mg | ORAL_CAPSULE | Freq: Three times a day (TID) | ORAL | 0 refills | Status: DC

## 2016-04-08 MED ORDER — BENZONATATE 100 MG PO CAPS
200.0000 mg | ORAL_CAPSULE | Freq: Once | ORAL | Status: AC
  Administered 2016-04-08: 200 mg via ORAL
  Filled 2016-04-08: qty 2

## 2016-04-08 MED ORDER — FENTANYL CITRATE (PF) 100 MCG/2ML IJ SOLN: 50.0000 ug | Freq: Once | INTRAMUSCULAR | Status: DC

## 2016-04-08 MED ORDER — NAPROXEN 250 MG PO TABS
500.0000 mg | ORAL_TABLET | Freq: Once | ORAL | Status: AC
  Administered 2016-04-08: 500 mg via ORAL
  Filled 2016-04-08: qty 2

## 2016-04-08 MED ORDER — KETOROLAC TROMETHAMINE 15 MG/ML IJ SOLN: 15.0000 mg | Freq: Once | INTRAMUSCULAR | Status: DC

## 2016-04-08 MED ORDER — MORPHINE SULFATE (PF) 4 MG/ML IV SOLN: 4.0000 mg | Freq: Once | INTRAVENOUS | Status: DC

## 2016-04-08 MED ORDER — GI COCKTAIL ~~LOC~~
30.0000 mL | Freq: Once | ORAL | Status: AC
  Administered 2016-04-08: 30 mL via ORAL
  Filled 2016-04-08: qty 30

## 2016-04-08 NOTE — ED Provider Notes (Signed)
[facility]-EMERGENCY DEPT Provider Note   CSN: [id number] Arrival date & time: 04/07/16  2230  First Provider Contact:  First MD Initiated Contact with Patient 04/08/16 0104     By signing my name below, I, Linna Darner, attest that this documentation has been prepared under the direction and in the presence of physician practitioner, Derwood Kaplan, MD. Electronically Signed: Linna Darner, Scribe. 04/08/2016. 1:04 AM.  History   Chief Complaint Chief Complaint  Patient presents with  . Cough  . Shortness of Breath  . Abdominal Pain    The history is provided by the patient and a parent. A language interpreter was used.     HPI Comments: Deborah Chandler is a 35 y.o. female who presents to the Emergency Department complaining of sudden onset, constant, cough and SOB beginning around 6 PM yesterday. Her daughter states pt began coughing constantly yesterday evening and was having some SOB, so she took her to the ER. Pt also reports burning abdominal pain ongoing for "a while." She states her abdominal pain is more significant on the left side. Pt has seen gastroenterologists for her abdominal pain. She also complains of a throbbing headache and right ear pain beginning yesterday evening; she denies hearing loss from this ear. She denies recent long travel. Pt denies being around sick contacts with similar symptoms recently. Pt further denies numbness, weakness, or any other associated symptoms.  Past Medical History:  Diagnosis Date  . Anemia    ?  . Gastritis   . Ovarian cyst     Patient Active Problem List   Diagnosis Date Noted  . Nausea without vomiting 03/16/2016  . LUQ abdominal pain 03/16/2016  . Abdominal pain, left lower quadrant 02/24/2016  . GERD (gastroesophageal reflux disease) 02/04/2016  . Nausea with vomiting 02/04/2016  . Candidal intertrigo 02/04/2016  . Dysmenorrhea 03/20/2015  . Menorrhagia 03/20/2015    Past Surgical History:  Procedure Laterality Date   . WISDOM TOOTH EXTRACTION     one    OB History    Gravida Para Term Preterm AB Living   0 0 2   SAB TAB Ectopic Multiple Live Births   0 0 0 0         Home Medications    Prior to Admission medications   Medication Sig Start Date End Date Taking? Authorizing Provider  benzonatate (TESSALON) 100 MG capsule Take 1 capsule (100 mg total) by mouth every 8 (eight) hours. 04/08/16   Derwood Kaplan, MD  bismuth-metronidazole-tetracycline (PYLERA) [phone] MG capsule Take 3 capsules by mouth 4 (four) times daily -  before meals and at bedtime. 03/16/16   Jessica D Zehr, PA-C  metroNIDAZOLE (FLAGYL) 500 MG tablet Take 1 tablet (500 mg total) by mouth 3 (three) times daily. 02/24/16   Abram Sander, MD  omeprazole (PRILOSEC) 20 MG capsule Take 1 capsule (20 mg total) by mouth daily. 04/09/16   Ruffin Frederick, MD    Family History Family History  Problem Relation Age of Onset  . Asthma Mother   . Diabetes Father   . Diabetes Paternal Grandmother   . Diabetes Paternal Aunt     x 2    Social History Social History  Substance Use Topics  . Smoking status: Never Smoker  . Smokeless tobacco: Never Used  . Alcohol use No     Allergies   Review of patient's allergies indicates no known allergies.   Review of Systems Review of Systems  A complete 10  system review of systems was obtained and all systems are negative except as noted in the HPI and PMH.   Physical Exam Updated Vital Signs BP 109/84   Pulse 78   Temp 98.2 F (36.8 C)   Resp 20   LMP 03/06/2016   SpO2 100%   Physical Exam  Constitutional: She is oriented to person, place, and time. She appears well-developed and well-nourished. No distress.  HENT:  Head: Normocephalic and atraumatic.  Mouth/Throat: Oropharynx is clear and moist.  Posterior pharynx is clear. No exudate. No tonsillar enlargement.  Eyes: Conjunctivae and EOM are normal.  Neck: Neck supple. No tracheal deviation present.  No  nuchal rigidity.  Cardiovascular: Normal rate and regular rhythm.   No JVD.  Pulmonary/Chest: Effort normal. No respiratory distress. She has no wheezes. She has no rhonchi. She has no rales.  Lungs are clear.  Abdominal: Soft.  No peritoneal signs.  Musculoskeletal: Normal range of motion.  Lymphadenopathy:    She has no cervical adenopathy.  Neurological: She is alert and oriented to person, place, and time.  Skin: Skin is warm and dry.  Psychiatric: She has a normal mood and affect. Her behavior is normal.  Nursing note and vitals reviewed.    ED Treatments / Results  Labs (all labs ordered are listed, but only abnormal results are displayed) Labs Reviewed  COMPREHENSIVE METABOLIC PANEL - Abnormal; Notable for the following:       Result Value   Glucose, Bld 133 (*)    Calcium 8.8 (*)    Albumin 3.3 (*)    All other components within normal limits  CBC - Abnormal; Notable for the following:    WBC 10.8 (*)    Hemoglobin 11.9 (*)    MCV 76.3 (*)    MCH 24.1 (*)    All other components within normal limits  LIPASE, BLOOD    EKG  EKG Interpretation None       Radiology No results found.  Procedures Procedures (including critical care time)  Medications Ordered in ED Medications  ondansetron (ZOFRAN-ODT) disintegrating tablet 4 mg (4 mg Oral Given 04/07/16 2247)  ipratropium-albuterol (DUONEB) 0.5-2.5 (3) MG/3ML nebulizer solution 3 mL (3 mLs Nebulization Given 04/07/16 2248)  gi cocktail (Maalox,Lidocaine,Donnatal) (30 mLs Oral Given 04/08/16 0250)  benzonatate (TESSALON) capsule 200 mg (200 mg Oral Given 04/08/16 0249)  naproxen (NAPROSYN) tablet 500 mg (500 mg Oral Given 04/08/16 0250)     Initial Impression / Assessment and Plan / ED Course  I have reviewed the triage vital signs and the nursing notes.  Pertinent labs & imaging results that were available during my care of the patient were reviewed by me and considered in my medical decision making (see chart  for details).  Clinical Course    I personally performed the services described in this documentation, which was scribed in my presence. The recorded information has been reviewed and is accurate.  PT with cough, dib, abd pain. Abd pain is not acute, and she has known h. Pylori. Also the exam is non peritoneal. No CT imaging indicated. Labs are reassuring. Her DIB has improved. CXR and lung exam are normal. ? Anxiety. Final Clinical Impressions(s) / ED Diagnoses   Final diagnoses:  Cough  H. pylori infection    New Prescriptions Discharge Medication List as of 04/08/2016  2:44 AM    START taking these medications   Details  benzonatate (TESSALON) 100 MG capsule Take 1 capsule (100 mg total) by mouth every  8 (eight) hours., Starting Thu 04/08/2016, Print         Derwood Kaplan, MD 04/10/16 [phone]

## 2016-04-09 ENCOUNTER — Other Ambulatory Visit: Payer: Self-pay

## 2016-04-09 ENCOUNTER — Telehealth: Payer: Self-pay | Admitting: Gastroenterology

## 2016-04-09 MED ORDER — OMEPRAZOLE 20 MG PO CPDR: 20.0000 mg | DELAYED_RELEASE_CAPSULE | Freq: Every day | ORAL | 2 refills | Status: DC

## 2016-04-09 NOTE — Telephone Encounter (Signed)
Rx renewed

## 2016-04-09 NOTE — Telephone Encounter (Signed)
Once she finishes the antibiotics, she is to take Omeprazole once daily in the mornings before her first meal of the day. Keep the follow up appointment she has scheduled. The doctor will decide if she can stop them at that visit.I hope she is doing well.

## 2016-04-09 NOTE — Telephone Encounter (Signed)
She tells me she does not have any omeprazole. Can you send some to her pharmacy please?

## 2016-04-12 ENCOUNTER — Ambulatory Visit (INDEPENDENT_AMBULATORY_CARE_PROVIDER_SITE_OTHER): Payer: Medicaid Other | Admitting: Family Medicine

## 2016-04-12 ENCOUNTER — Other Ambulatory Visit (HOSPITAL_COMMUNITY)
Admission: RE | Admit: 2016-04-12 | Discharge: 2016-04-12 | Disposition: A | Discharge status: Home / Self Care | Payer: Medicaid Other | Source: Ambulatory Visit | Attending: Family Medicine | Admitting: Family Medicine

## 2016-04-12 ENCOUNTER — Encounter: Payer: Self-pay | Admitting: Family Medicine

## 2016-04-12 VITALS — BP 125/89 | HR 86 | Temp 98.4°F | Ht 60.5 in | Wt 213.2 lb

## 2016-04-12 DIAGNOSIS — L292 Pruritus vulvae: Secondary | ICD-10-CM

## 2016-04-12 DIAGNOSIS — L298 Other pruritus: Secondary | ICD-10-CM | POA: Diagnosis not present

## 2016-04-12 DIAGNOSIS — N898 Other specified noninflammatory disorders of vagina: Secondary | ICD-10-CM

## 2016-04-12 DIAGNOSIS — Z113 Encounter for screening for infections with a predominantly sexual mode of transmission: Secondary | ICD-10-CM | POA: Diagnosis present

## 2016-04-12 LAB — POCT WET PREP (WET MOUNT)
CLUE CELLS WET PREP WHIFF POC: NEGATIVE
TRICHOMONAS WET PREP HPF POC: ABSENT

## 2016-04-12 MED ORDER — FLUCONAZOLE 150 MG PO TABS: 150.0000 mg | ORAL_TABLET | Freq: Once | ORAL | 0 refills | Status: AC

## 2016-04-12 NOTE — Patient Instructions (Signed)
Vaginitis moniliásica  (Monilial Vaginitis)  La vaginitis es una inflamación (irritación, hinchazón) de la vagina y la vulva. Esta no es una enfermedad de transmisión sexual.   CAUSAS  Este tipo de vaginitis lo causa un hongo (candida) que normalmente se encuentra en la vagina. El hongo candida se ha desarrollado hasta el punto de ocasionar problemas en el equilibrio químico.  SÍNTOMAS  · Secreción vaginal espesa y blanca.  · Hinchazón, picazón, enrojecimiento e inflamación de la vagina y en algunos casos de los labios vaginales (vulva).  · Ardor o dolor al orinar.  · Dolor en las relaciones sexuales.  DIAGNÓSTICO  Los factores que favorecen la vaginitis moniliasica son:  · Etapas de virginidad y postmenopáusicas.  · Embarazo.  · Infecciones.  · Sentir cansancio, estar enferma o estresada, especialmente si ya ha sufrido este problema en el pasado.  · Diabetes Buen control ayudará a disminuír la probabilidad.  · Píldoras anticonceptivas  · Ropa interior muy ajustada.  · El uso de espumas de baño, aerosoles femeninos duchas vaginales o tampones con desodorante.  · Algunos antibióticos (medicamentos que destruyen gérmenes).  · Si contrae alguna enfermedad puede sufrir recurrencias esporádicas.  TRATAMIENTO  El profesional que lo asiste prescribirá medicamentos.  · Hay diferentes tipos de cremas y supositorios vaginales que tratan específicamente la vaginitis moniliásica. Para infecciones por hongos recurrentes, utilice un supositorio o crema en la vagina dos veces por semana, o según se le indique.  · También podrán utilizarse cremas con corticoides o anti moniliásicas para la picazón o la irritación de la vulva. Consulte con el profesional que la asiste.  · Si la crema no da resultado, podrá aplicarse en la vagina una solución con azul de metileno.  · El consumo de yogur puede prevenir este tipo de vaginitis.  INSTRUCCIONES PARA EL CUIDADO DOMICILIARIO  · Tome todos los medicamentos tal como se le indicó.  · No  mantenga relaciones sexuales hasta que el tratamiento se haya completado, o según las indicaciones del profesional que la asiste.  · Tome baños de asiento tibios.  · No se aplique duchas vaginales.  · No utilice tampones, especialmente los perfumados.  · Use ropa interior de algodón  · Evite los pantalones ajustados y las medias tipo panty.  · Comunique a sus compañeros sexuales que sufre una infección por hongos. Ellos deben concurrir para un control médico si tienen síntomas como una urticaria leve o picazón.  · Sus compañeros sexuales deben tratarse también si la infección es difícil de eliminar.  · Practique el sexo seguro - use condones  · Algunos medicamentos vaginales ocasionan fallas en los condones de látex. Los medicamentos vaginales que pueden dañar los condones son:  ¨ Crema cleocina  ¨ Butoconazole (Femstat®)  ¨ Terconazole (Terazol®) supositorios vaginales  ¨ Miconazole (Monistat®) (es un medicamento de venta libre)  SOLICITE ATENCIÓN MÉDICA SI:  · Usted tiene una temperatura oral de más de 38,9° C (102° F).  · Si la infección empeora luego de 2 días de tratamiento.  · Si la infección no mejora luego de 3 días de tratamiento.  · Aparecen ampollas en o alrededor de la vagina.  · Si aparece una hemorragia vaginal y no es el momento del período.  · Siente dolor al orinar.  · Presenta problemas intestinales.  · Tiene dolor durante las relaciones sexuales.     Esta información no tiene como fin reemplazar el consejo del médico. Asegúrese de hacerle al médico cualquier pregunta que tenga.       Document Released: 06/02/2005 Document Revised: 11/15/2011  Elsevier Interactive Patient Education ©2016 Elsevier Inc.

## 2016-04-12 NOTE — Progress Notes (Signed)
Subjective: CC: vulvar pruritus Utilized Spanish interpreter Ena Dawleyania [phone] HPI: Patient is a 35 y.o. female presenting to clinic today for a same day appt for vulvar pruritus.  She's had vaginal burning and itching x 9 days that is stable. Sometimes she has a yellow discharge. She notes that urinating and wiping makes it worse. States she is not worried about STD initially but then notes she should get tested in case she got something from a bathroom. She has not tried anything for the itching.   LMP 04/09/16 until today. Not currently sexually active.   Social History: lives in a shelter with her children and grandchild   ROS: All other systems reviewed and are negative besides: she would like a referral for vascular surgery for her varicose veins in her thighs, she would like another referral for surgery on her vagina as things are "not where they're supposed to be," she is still having LUQ pain that is intermittent, she's been treated by GI for H pylori but has not called them recently. I discussed that same day appts are for 1 acute issue, advised her to f/u with her PCP about these other things.   Past Medical History Patient Active Problem List   Diagnosis Date Noted  . Nausea without vomiting 03/16/2016  . LUQ abdominal pain 03/16/2016  . Abdominal pain, left lower quadrant 02/24/2016  . GERD (gastroesophageal reflux disease) 02/04/2016  . Nausea with vomiting 02/04/2016  . Candidal intertrigo 02/04/2016  . Dysmenorrhea 03/20/2015  . Menorrhagia 03/20/2015    Medications- reviewed and updated Current Outpatient Prescriptions  Medication Sig Dispense Refill  . benzonatate (TESSALON) 100 MG capsule Take 1 capsule (100 mg total) by mouth every 8 (eight) hours. 21 capsule 0  . bismuth-metronidazole-tetracycline (PYLERA) 140-125-125 MG capsule Take 3 capsules by mouth 4 (four) times daily -  before meals and at bedtime. 168 capsule 0  . metroNIDAZOLE (FLAGYL) 500 MG tablet Take 1  tablet (500 mg total) by mouth 3 (three) times daily. 21 tablet 0  . omeprazole (PRILOSEC) 20 MG capsule Take 1 capsule (20 mg total) by mouth daily. 30 capsule 2   No current facility-administered medications for this visit.     Objective: Office vital signs reviewed. BP 125/89   Pulse 86   Temp 98.4 F (36.9 C) (Oral)   Ht 5' 0.5" (1.537 m)   Wt 213 lb 3.2 oz (96.7 kg)   LMP 03/06/2016   BMI 40.95 kg/m    Physical Examination:  General: Awake, alert, well- nourished, NAD Abdomen: +BS, soft, non-distended, mildly tender in the LUQ without rebound or guarding.  GYN: Performed with a chaperone.  External genitalia within normal limits with mild irritation of the vulva.  Vaginal mucosa pink, moist, normal rugae.  Nonfriable cervix without lesions however very low in the vaginal canal. Did not complete a full assessment for uterine/organ prolapse. Scant old blood noted on speculum exam, no discharge.  Bimanual exam revealed normal, nongravid uterus.  No cervical motion tenderness. No adnexal masses bilaterally.    Microscopic wet-mount exam shows no yeast, trich, or clue cells.   Assessment/Plan: Vulvar pruritis: mild irritation noted on exam. No rash or lichenification noted. Given physical exam and history, will treat for yeast infection. Rx for diflucan sent. GC/Chlamydia ordered. Discussed return precautions.   - Advised pt to f/u with her PCP in 1-2 weeks given her other numerous complaints, also advised her to f/u with GI given her continued LUQ pain.  Archie Patten PGY-3, [place]

## 2016-04-13 LAB — CERVICOVAGINAL ANCILLARY ONLY
CHLAMYDIA, DNA PROBE: NEGATIVE
NEISSERIA GONORRHEA: NEGATIVE

## 2016-04-14 ENCOUNTER — Telehealth: Payer: Self-pay | Admitting: Family Medicine

## 2016-04-14 NOTE — Telephone Encounter (Signed)
Please call and let the patient know her test for gonorrhea and chlamydia were negative.  Thanks, Joanna Puffrystal S. Jones Viviani, MD [facility] Resident  04/14/2016, 1:58 PM

## 2016-04-15 ENCOUNTER — Encounter: Payer: Self-pay | Admitting: Pediatric Intensive Care

## 2016-04-15 DIAGNOSIS — Z139 Encounter for screening, unspecified: Secondary | ICD-10-CM

## 2016-05-03 ENCOUNTER — Encounter: Payer: Self-pay | Admitting: Obstetrics and Gynecology

## 2016-05-03 ENCOUNTER — Ambulatory Visit (INDEPENDENT_AMBULATORY_CARE_PROVIDER_SITE_OTHER): Payer: Medicaid Other | Admitting: Obstetrics and Gynecology

## 2016-05-03 VITALS — BP 143/107 | HR 78 | Temp 98.2°F | Wt 214.0 lb

## 2016-05-03 DIAGNOSIS — R1013 Epigastric pain: Secondary | ICD-10-CM | POA: Diagnosis not present

## 2016-05-03 DIAGNOSIS — K219 Gastro-esophageal reflux disease without esophagitis: Secondary | ICD-10-CM | POA: Diagnosis present

## 2016-05-03 DIAGNOSIS — R110 Nausea: Secondary | ICD-10-CM | POA: Diagnosis not present

## 2016-05-03 DIAGNOSIS — R109 Unspecified abdominal pain: Secondary | ICD-10-CM | POA: Insufficient documentation

## 2016-05-03 HISTORY — DX: Unspecified abdominal pain: R10.9

## 2016-05-03 MED ORDER — OMEPRAZOLE 20 MG PO CPDR: 20.0000 mg | DELAYED_RELEASE_CAPSULE | Freq: Every day | ORAL | 1 refills | Status: DC

## 2016-05-03 MED ORDER — PROMETHAZINE HCL 12.5 MG PO TABS: 12.5000 mg | ORAL_TABLET | Freq: Three times a day (TID) | ORAL | 0 refills | Status: DC | PRN

## 2016-05-03 NOTE — Patient Instructions (Signed)
lcera pptica  (Peptic Ulcer)  La lcera pptica es una llaga dolorosa en la membrana que recubre el esfago (lcera esofgica), el estmago (lcera gstrica), o la primera parte del intestino delgado (lcera duodenal). La lcera causa erosin en los tejidos profundos.  CAUSAS  Normalmente, el revestimiento del estmago y del intestino delgado se protegen a s mismos del cido con que se digieren los alimentos. El revestimiento protector puede daarse debido a:   Una infeccin causada por una bacteria llamada Helicobacter pylori (H. pylori).  El uso regular de medicamentos anti-inflamatorios no esteroides (AINE), como el ibuprofeno o la aspirina.  El consumo de tabaco. Otros factores de riesgo incluyen ser mayor de 50 aos, el consumo de alcohol en exceso y Wilburt Finlaytener antecedentes familiares de lcera.  SNTOMAS   Dolor quemante o punzante en la zona entre el pecho y el ombligo.  Acidez.  Nuseas y vmitos.  Hinchazn. El dolor empeora con el estmago vaco y por la noche. Si la lcera sangra, puede causar:   Materia fecal de color negro alquitranado.  Vmito de sangre roja brillante.  Vmito de aspecto similar a la borra del caf. DIAGNSTICO  El diagnstico se realiza basndose en la historia clnica y el examen fsico. Para encontrar las causas de la lcera podrn indicarle otros estudios y procedimientos. Encontrar la causa ayudar a Futures traderdeterminar el mejor tratamiento. Los estudios y procedimientos pueden incluir:   Anlisis de sangre, anlisis de materia fecal, o estudios del aliento para Engineer, manufacturingdetectar la bacteria H. pylori.  Una seriada del tracto gastrointestinal (GI) del esfago, el estmago y el intestino delgado.  Una endoscopia para examinar el esfago, el estmago y el intestino delgado.  Una biopsia. TRATAMIENTO  El tratamiento incluye:   La eliminacin de la causa de la lcera, como el tabaquismo, los [place]AINE o el alcohol.  Medicamentos para reducir la cantidad de cido en  el tracto digestivo.  Antibiticos si la causa de la lcera es la bacteria H. pylori.  Una endoscopia superior para tratar Rolan Lipauna lcera sangrante.  Ciruga si el sangrado es grave o si la lcera ha perforado Event organiseralgn lugar en el sistema digestivo. INSTRUCCIONES PARA EL CUIDADO EN EL HOGAR   Evite el tabaco, el alcohol y la cafena. El fumar puede aumentar el cido en el estmago y el tabaquismo continuado no favorecer la curacin de las lceras.  Evite los alimentos y las bebidas que le parece que le causan molestias o que le agravan su lcera.  Tome slo la medicacin que le indic el profesional. No tome sustitutos de venta libre de los medicamentos recetados sin Science writerconsultar a su mdico.  Cumpla con las consultas de control y hgase los estudios segn las indicaciones. SOLICITE ATENCIN MDICA SI:   La infeccin no mejora dentro de los [address]  [address]das despus de Programmer, systemsiniciar el tratamiento.  Siente indigestin o [place]acidez de manera continua. SOLICITE ATENCIN MDICA DE INMEDIATO SI:   Siente un dolor repentino y agudo o persistente en el abdomen.  La materia fecal es sanguinolenta o negra, de aspecto alquitranado.  Vomita sangre o el vmito tiene el aspecto similar a la borra del caf.  Si se siente mareado, dbil o que va a desmayarse.  Se siente transpirado o sudoroso. ASEGRESE DE QUE:   Comprende estas instrucciones.  Controlar su enfermedad.  Solicitar ayuda de inmediato si no mejora o si empeora.   Esta informacin no tiene Theme park managercomo fin reemplazar el consejo del mdico. Asegrese de hacerle al mdico cualquier pregunta que tenga.  Document Released: 06/02/2005 Document Revised: 05/17/2012 Elsevier Interactive Patient Education 2016 [employer].  Opciones de alimentos para pacientes con Occupational hygienist pptica (Food Choices for Peptic Ulcer Disease) Cuando se tiene Occupational hygienist pptica, los alimentos que se ingieren y los hbitos de alimentacin son Engineer, production. Elegir los alimentos adecuados  puede ayudar a Paramedic las molestias de la lcera pptica. QU PAUTAS GENERALES DEBO SEGUIR?  Elija las frutas, los vegetales, los cereales integrales, la carne de [place], de pescado y de ave con bajo contenido de grasas.  Lleve un registro de las comidas para identificar los alimentos que ocasionan sntomas.  Evite los alimentos que causen irritacin o Engineer, mining. Pueden ser distintos para cada persona.  Haga comidas pequeas con frecuencia en lugar de tres comidas [employer]. El dolor puede empeorar cuando el estmago est vaco.  Evite comer poco tiempo antes de [place]. QU ALIMENTOS NO SE RECOMIENDAN? Los siguientes son algunos alimentos y bebidas que pueden empeorar los sntomas:  Pimienta roja, blanca y negra.  Salsa picante.  Ajes.  [place] en polvo.  Chocolate y cacao.  Alcohol.  T, caf y bebidas cola (comunes y sin cafena). Los artculos mencionados arriba pueden no ser [employer] de las bebidas y los alimentos que se Theatre stage manager. Comunquese con el nutricionista para recibir ms informacin.   Esta informacin no tiene Theme park manager el consejo del mdico. Asegrese de hacerle al mdico cualquier pregunta que tenga.   Document Released: 02/22/2012 Document Revised: 08/28/2013 Elsevier Interactive Patient Education [employer]

## 2016-05-03 NOTE — Progress Notes (Signed)
   Subjective:   Patient ID: Deborah Chandler, female    DOB: November 15, 1980, 35 y.o.   MRN: [id number]  Patient presents for Same Day Appointment. Video interpretor Milderd MeagerOriana [phone] used for visit.  Chief Complaint  Patient presents with  . Abdominal Pain    HPI: #ABDOMINAL PAIN Past medical history of gastritis and H. Pylori infection who presents with complaint of worsening abdominal pain. Patient reports she has had worsening constant aching/cramping abdominal pain to her epigastric region for the past 4 days. Denies any aggravating or alleviating factors. Endorses associated nausea without vomiting. Denies diarrhea, fever, dysuria and vaginal d/c. Patient states that this abdominal pain is a chronic issue for her that she has been dealing with for the past 3-4 months. She has been seen by a GI doctor who diagnosed her wit H.pyloi and GERD. She was given antibiotics and omeprazole. She has completed course of antibiotics and now is just taking omeprazole for abdominal pain. She states the medicine calms down her stomach some but she still experiences burning and hurting. Denies history of abdominal surgeries. Denies any known sick contacts.  Review of Systems   See HPI for ROS.   Past medical history, surgical, family, and social history reviewed and updated in the EMR as appropriate.  Objective:  BP (!) 143/107   Pulse 78   Temp 98.2 F (36.8 C) (Oral)   Wt 214 lb (97.1 kg)   LMP 03/13/2016   SpO2 100%   BMI 41.11 kg/m  Vitals and nursing note reviewed  Physical Exam General: Awake, alert, well- nourished, NAD ENMT:  Moist mucous membranes.  Cardio: RRR, no m/r/g noted.  Pulm: No increased WOB.  CTAB, without wheezes, rhonchi or crackles noted.  GI: Positive bowel sounds, soft, nondistended, patient endorses tenderness all over but mostly in epigastric region.  No grimace with palpation.  Skin: dry, intact, no rashes or lesions  Assessment & Plan:  GERD (gastroesophageal  reflux disease) Continue omeprazole for GERD. On highest dose of medication. Most likely has PUD as well with H. Pylori infection.   Nausea without vomiting Zofran given for nausea. Denies vomiting at this time.   Abdominal pain Chronic abdominal pain. H/o gastritis. Also may likely have a PUD component. CT abdomen pelvis reviewed that was unremarkable. Continue PPI. Completed antibiotic therapy for H. Pylori infection. Will follow up with GI soon to see if eradicated. Handout given and advised on proper food to eat to help with GERD/PUD. Return precautions given.    Meds ordered this encounter  Medications  . omeprazole (PRILOSEC) 20 MG capsule    Sig: Take 1 capsule (20 mg total) by mouth daily.    Dispense:  30 capsule    Refill:  1    Please cancel and reverse charges on Pantoprazole if she has not picked it up  . promethazine (PHENERGAN) 12.5 MG tablet    Sig: Take 1 tablet (12.5 mg total) by mouth every 8 (eight) hours as needed for nausea or vomiting.    Dispense:  20 tablet    Refill:  0     Caryl AdaJazma Dalaysia Harms, DO 05/03/2016, 12:19 PM PGY-3, [facility]

## 2016-05-05 NOTE — Assessment & Plan Note (Signed)
Chronic abdominal pain. H/o gastritis. Also may likely have a PUD component. CT abdomen pelvis reviewed that was unremarkable. Continue PPI. Completed antibiotic therapy for H. Pylori infection. Will follow up with GI soon to see if eradicated. Handout given and advised on proper food to eat to help with GERD/PUD. Return precautions given.

## 2016-05-05 NOTE — Assessment & Plan Note (Signed)
Zofran given for nausea. Denies vomiting at this time.

## 2016-05-05 NOTE — Assessment & Plan Note (Addendum)
Continue omeprazole for GERD. On highest dose of medication. Most likely has PUD as well with H. Pylori infection.

## 2016-05-06 NOTE — Congregational Nurse Program (Signed)
Congregational Nurse Program Note  Date of Encounter: 04/15/2016  Past Medical History: Past Medical History:  Diagnosis Date  . Anemia    ?  . Gastritis   . Ovarian cyst     Encounter Details:     CNP Questionnaire - 04/15/16 1530      Patient Demographics   Is this a new or existing patient? New   Patient is considered a/an Immigrant   Race Latino/Hispanic     Patient Assistance   Location of Patient Assistance [employer]   Patient's financial/insurance status [employer]   Uninsured Patient Yes   Interventions Counseled to make appt. with provider   Patient referred to apply for the following financial assistance Not Applicable   Food insecurities addressed Not Applicable   Transportation assistance No   Assistance securing medications No   Educational health offerings Medications;Navigating the healthcare system     Encounter Details   Primary purpose of visit Navigating the Healthcare System;Post ED/Hospitalization Visit   Was an Emergency Department visit averted? Not Applicable   Does patient have a medical provider? Yes   Patient referred to Follow up with established PCP   Was a mental health screening completed? (GAINS tool) No   Does patient have dental issues? No   Does patient have vision issues? No   Does your patient have an abnormal blood pressure today? No   Since previous encounter, have you referred patient for abnormal blood pressure that resulted in a new diagnosis or medication change? No   Does your patient have an abnormal blood glucose today? No   Since previous encounter, have you referred patient for abnormal blood glucose that resulted in a new diagnosis or medication change? No   Was there a life-saving intervention made? No         Amb Nursing Assessment - 05/03/16 1419      Pre-visit preparation   Pre-visit preparation completed Yes     Abuse/Neglect Assessment   Do you feel unsafe in your current relationship? No    Do you feel physically threatened by others? No   Anyone hurting you at home, work, or school? No   Unable to ask? No     Patient Literacy   How often do you need to have someone help you when you read instructions, pamphlets, or other written materials from your doctor or pharmacy? 1 - Never     Web designerLanguage Assistant   Interpreter Needed? Yes   Interpreter Agency [employer]   Interpreter Name Milderd MeagerOriana   Interpreter ID [id number]     Via interpreter Dory- client states that she has been having stomach pains. Client states that she has seen a "stomach doctor" and is on medication. Client is confused about medication and believes she should still be taking antibiotics. CN explained that antibiotics were to be taken until completion. Client states she has follow up appointment with gastroenterologist. CN advised to keep appointment and follow up in CN clinic as necessary.

## 2016-05-07 ENCOUNTER — Ambulatory Visit: Payer: Medicaid Other | Admitting: Family Medicine

## 2016-05-13 ENCOUNTER — Ambulatory Visit: Payer: Medicaid Other | Admitting: Gastroenterology

## 2016-05-24 ENCOUNTER — Encounter: Payer: Self-pay | Admitting: Family Medicine

## 2016-05-24 ENCOUNTER — Ambulatory Visit (INDEPENDENT_AMBULATORY_CARE_PROVIDER_SITE_OTHER): Payer: Medicaid Other | Admitting: Family Medicine

## 2016-05-24 VITALS — BP 129/77 | HR 89 | Temp 98.2°F | Ht 60.5 in | Wt 213.0 lb

## 2016-05-24 DIAGNOSIS — R1084 Generalized abdominal pain: Secondary | ICD-10-CM

## 2016-05-24 DIAGNOSIS — I83009 Varicose veins of unspecified lower extremity with ulcer of unspecified site: Secondary | ICD-10-CM | POA: Diagnosis present

## 2016-05-24 DIAGNOSIS — Z23 Encounter for immunization: Secondary | ICD-10-CM

## 2016-05-24 DIAGNOSIS — L97909 Non-pressure chronic ulcer of unspecified part of unspecified lower leg with unspecified severity: Principal | ICD-10-CM

## 2016-05-24 NOTE — Progress Notes (Signed)
Subjective:   Patient ID: Deborah Chandler    DOB: 14-Apr-1981, 35 y.o. female   MRN: [id number]  CC: abdominal pain  HPI: Deborah Chandler is a 35 y.o. female who presents to clinic today for abdominal pain .  Per patient, the abdominal pain has been present over the past 5 months and it comes and goes without any relation to eating food.  First began on the LUQ and is now diffuse with patient unable to localize one particular spot.  States pain is 6/10 in severity and is dull/throbbing in nature. It worsens at night and sometimes awakens her.  She is sometimes nauseous but has not vomited.  She states she was given medication for reflux in the past which has not helped her.  She no longer has symptoms of acid reflux.    Also complaining of LLQ pain which comes on around time of her menstrual cycle and then goes away.  No spotting between menstrual cycles. She has been diagnosed with L-sided ovarian cyst.  She denies possibility of pregnancy given that she had her tubes ligated when she was 16 after having her son.  She denies any changes in her bowel regimen and states she is sometimes constipated but otherwise has regular bowel movements. Denies noticing blood in stool.  Denies fevers, chills, recent illnesses, recent travel, and trauma.   Denies alcohol use. Per patient, her diet consists almost entirely of fried and fatty foods.  Patient unaware if this is bad for her and asks if she can continue eating fried cheeses, french fries, and fried meats.  Patient does not exercise much but states she can start walking or join a gym to work out.   Patient also complaining of multiple varicose veins over bilateral upper thighs. States they are causing her pain in both feet/heels and is requesting a procedure to remove them. She was supposed to have it operated on in [place] but has since moved here to the [place]. Denies swelling of the feet. Is able to ambulate normally.   ROS: See HPI for  pertinent ROS.  PMFSH: Pertinent past medical, surgical, family, and social history were reviewed and updated as appropriate. Smoking status reviewed.  Medications reviewed. Current Outpatient Prescriptions  Medication Sig Dispense Refill  . benzonatate (TESSALON) 100 MG capsule Take 1 capsule (100 mg total) by mouth every 8 (eight) hours. 21 capsule 0  . omeprazole (PRILOSEC) 20 MG capsule Take 1 capsule (20 mg total) by mouth daily. 30 capsule 1  . promethazine (PHENERGAN) 12.5 MG tablet Take 1 tablet (12.5 mg total) by mouth every 8 (eight) hours as needed for nausea or vomiting. 20 tablet 0   No current facility-administered medications for this visit.     Objective:   BP 129/77   Pulse 89   Temp 98.2 F (36.8 C) (Oral)   Ht 5' 0.5" (1.537 m)   Wt 213 lb (96.6 kg)   LMP 05/09/2016 (Exact Date)   BMI 40.91 kg/m  Vitals and nursing note reviewed.  General: obese, well nourished, well developed, in no acute distress with non-toxic appearance HEENT: NCAT, MMM, oropharynx clear Neck: supple, non-tender no LAD CV: RRR, no m/r/g Lungs: CTA B/L with normal WOB Abdomen: soft, obese, diffusely tender to palpation, LLQ pain on deep palpation, no masses or organomegaly palpable, +bs, negative Murphy's sign, no guarding or rigidity Skin: warm, dry, no rashes or lesions, cap refill < 2 seconds Extremities: warm and well perfused, normal tone, numerous varicose veins on  bilateral upper thighs  Assessment & Plan:   Abdominal pain -present over past 5 months and worsening at night. Unlikely cholecystitis given normal LFTs on recent labs, no systemic symptoms, no RUQ pain on physical exam -recent treatment for H. Pylori, patient completed a course of Pylera -consider peptic vs duodenal ulcer -Advised to improve diet, cut out fatty and fried foods from diet as these are exacerbating symptoms and contributing to further weight gain -patient has appointment with GI specialist tomorrow  9/19 -will follow closely  LLQ pain -comes and goes with menstrual cycle. Classic for ovarian cyst -reassuring given consistent with location of previously diagnosed ovarian cyst on U/S -continue to monitor  Varicose veins -numerous varicose veins over bilateral upper thighs. Patient requesting a procedure to remove them. Advised to lose weight, improve diet and increase exercise in order to prevent stasis of blood flow -Also advised use of compression stockings and elevating the legs -Referral to vascular surgery provided -will follow up  Health maintenance -Flu vaccine administered today  Follow up : 4 weeks   Orders Placed This Encounter  Procedures  . Flu Vaccine QUAD 36+ mos IM  . Ambulatory referral to Vascular Surgery    Referral Priority:   Routine    Referral Type:   Surgical    Referral Reason:   Specialty Services Required    Requested Specialty:   Vascular Surgery    Number of Visits Requested:   1    Freddrick March, MD [facility], PGY-1 05/24/2016 5:49 PM

## 2016-05-25 ENCOUNTER — Encounter: Payer: Self-pay | Admitting: Gastroenterology

## 2016-05-25 ENCOUNTER — Ambulatory Visit (INDEPENDENT_AMBULATORY_CARE_PROVIDER_SITE_OTHER): Payer: Medicaid Other | Admitting: Gastroenterology

## 2016-05-25 VITALS — BP 117/82 | Ht 65.0 in | Wt 217.0 lb

## 2016-05-25 DIAGNOSIS — K219 Gastro-esophageal reflux disease without esophagitis: Secondary | ICD-10-CM | POA: Diagnosis not present

## 2016-05-25 DIAGNOSIS — R1013 Epigastric pain: Secondary | ICD-10-CM | POA: Diagnosis not present

## 2016-05-25 MED ORDER — OMEPRAZOLE 40 MG PO CPDR: 40.0000 mg | DELAYED_RELEASE_CAPSULE | Freq: Two times a day (BID) | ORAL | 3 refills | Status: DC

## 2016-05-25 NOTE — Progress Notes (Signed)
HPI :  35 y/o female here for a follow up visit. Previously seen by Doug Sou for epigastric pain. Prior CT abdomen negative. Since the last visit she tested for H pylori, had (+) IgG, treated with 14 days of Pylera. She was able to take the entire course of Pylera, completed it in August. She has also been taking omeprazole. She has felt some persistent symptoms despite therapy. NO improvement. She is not taking any omeprazole at all at present time.    She continues to have some pain in her abdomen, as well as some "burning". She reports burning over the "entire abdomen", but seems to focus in the epigastric area. She reports when she doesn't eat it will get worse, and when she eats it will make her feel better. She has been having trouble sleeping due to symptoms, she wakes at night to drink or eats something to make her feel better. She has had some burning in her throat. She has water brash. She has some ongoing regurgtiation as well. She thinks she has had a prior endoscopy, no report available. She has a chronic microcytosis, mild anemia. She endorses heavy menses which bother her routinely, along with significant cramping. No blood in the stools. She endorses taking Aleve twice daily during her menses and has been taking this recently.    Past Medical History:  Diagnosis Date  . Anemia    ?  . Gastritis   . H. pylori infection   . Ovarian cyst      Past Surgical History:  Procedure Laterality Date  . WISDOM TOOTH EXTRACTION     one   Family History  Problem Relation Age of Onset  . Asthma Mother   . Diabetes Father   . Diabetes Paternal Grandmother   . Diabetes Paternal Aunt     x 2   Social History  Substance Use Topics  . Smoking status: Never Smoker  . Smokeless tobacco: Never Used  . Alcohol use No   No current outpatient prescriptions on file.   No current facility-administered medications for this visit.    No Known Allergies   Review of Systems: All  systems reviewed and negative except where noted in HPI.   Lab Results  Component Value Date   WBC 10.8 (H) 04/07/2016   HGB 11.9 (L) 04/07/2016   HCT 37.6 04/07/2016   MCV 76.3 (L) 04/07/2016   PLT 303 04/07/2016    Lab Results  Component Value Date   CREATININE 0.70 04/07/2016   BUN 12 04/07/2016   NA 136 04/07/2016   K 3.9 04/07/2016   CL 105 04/07/2016   CO2 22 04/07/2016    Lab Results  Component Value Date   ALT 23 04/07/2016   AST 18 04/07/2016   ALKPHOS 60 04/07/2016   BILITOT 0.5 04/07/2016   Lab Results  Component Value Date   LIPASE 43 04/07/2016      Physical Exam: BP 117/82   Ht 5\' 5"  (1.651 m)   Wt 217 lb (98.4 kg)   LMP 05/09/2016 (Exact Date)   BMI 36.11 kg/m  Constitutional: Pleasant,well-developed, female in no acute distress. HEENT: Normocephalic and atraumatic. Conjunctivae are normal. No scleral icterus. Neck supple.  Cardiovascular: Normal rate, regular rhythm.  Pulmonary/chest: Effort normal and breath sounds normal. No wheezing, rales or rhonchi. Abdominal: Soft, protuberant, nontender. There are no masses palpable. . Extremities: no edema Lymphadenopathy: No cervical adenopathy noted. Neurological: Alert and oriented to person place and time. Skin: Skin  is warm and dry. No rashes noted. Psychiatric: Normal mood and affect. Behavior is normal.   ASSESSMENT AND PLAN: 35 y/o female with ongoing epigastric pain and reflux symptoms as outlined above. She appears to have worsening with fasting, waking at night to eat to minimize symptoms, which are concerning for PUD. She has been taking Aleve due to menstrual cramps, and asked that she avoid NSAIDs, she can use tylenol for pain. Otherwise, given her ongoing symptoms recommmend an EGD to further evaluate. Discussed risks / benefits and she wished to proceed, will biopsy to check for H pylori eradication. In the interim, she should resume omeprazole and take twice daily to see if this helps in  the interim. All questions answered. Of note, chronic microcytosis with mild anemia noted, suspect related to significant menorrhagia she endorses, she can follow up with primary care for this issue.  Ileene PatrickSteven Kiel Cockerell, MD [facility] Pager [phone]

## 2016-05-25 NOTE — Patient Instructions (Signed)
You have been scheduled for an endoscopy. Please follow written instructions given to you at your visit today. If you use inhalers (even only as needed), please bring them with you on the day of your procedure.  We have sent the following medications to your pharmacy for you to pick up at your convenience: OMEPRAZOLE 40 MG TWICE A DAY

## 2016-05-26 ENCOUNTER — Ambulatory Visit (AMBULATORY_SURGERY_CENTER): Payer: Medicaid Other | Admitting: Gastroenterology

## 2016-05-26 ENCOUNTER — Encounter: Payer: Self-pay | Admitting: Gastroenterology

## 2016-05-26 VITALS — BP 149/92 | HR 70 | Temp 97.7°F | Resp 17 | Ht 65.0 in | Wt 217.0 lb

## 2016-05-26 DIAGNOSIS — R1013 Epigastric pain: Secondary | ICD-10-CM

## 2016-05-26 MED ORDER — ONDANSETRON HCL 4 MG/5ML PO SOLN: 4.0000 mg | Freq: Once | ORAL | Status: DC

## 2016-05-26 MED ORDER — SODIUM CHLORIDE 0.9 % IV SOLN
4.0000 mg | Freq: Once | INTRAVENOUS | Status: AC
  Administered 2016-05-26: 4 mg via INTRAVENOUS

## 2016-05-26 MED ORDER — SODIUM CHLORIDE 0.9 % IV SOLN: 500.0000 mL | INTRAVENOUS | Status: DC

## 2016-05-26 NOTE — Progress Notes (Signed)
Report to PACU, RN, vss, BBS= Clear.  

## 2016-05-26 NOTE — Patient Instructions (Signed)
YOU HAD AN ENDOSCOPIC PROCEDURE TODAY AT THE [facility]:   Refer to the procedure report that was given to you for any specific questions about what was found during the examination.  If the procedure report does not answer your questions, please call your gastroenterologist to clarify.  If you requested that your care partner not be given the details of your procedure findings, then the procedure report has been included in a sealed envelope for you to review at your convenience later.  YOU SHOULD EXPECT: Some feelings of bloating in the abdomen. Passage of more gas than usual.  Walking can help get rid of the air that was put into your GI tract during the procedure and reduce the bloating. If you had a lower endoscopy (such as a colonoscopy or flexible sigmoidoscopy) you may notice spotting of blood in your stool or on the toilet paper. If you underwent a bowel prep for your procedure, you may not have a normal bowel movement for a few days.  Please Note:  You might notice some irritation and congestion in your nose or some drainage.  This is from the oxygen used during your procedure.  There is no need for concern and it should clear up in a day or so.  SYMPTOMS TO REPORT IMMEDIATELY:     Following upper endoscopy (EGD)  Vomiting of blood or coffee ground material  New chest pain or pain under the shoulder blades  Painful or persistently difficult swallowing  New shortness of breath  Fever of 100F or higher  Black, tarry-looking stools  For urgent or emergent issues, a gastroenterologist can be reached at any hour by calling [phone].   DIET:  We do recommend a small meal at first, but then you may proceed to your regular diet.  Drink plenty of fluids but you should avoid alcoholic beverages for 24 hours.  ACTIVITY:  You should plan to take it easy for the rest of today and you should NOT DRIVE or use heavy machinery until tomorrow (because of the sedation medicines  used during the test).    FOLLOW UP: Our staff will call the number listed on your records the next business day following your procedure to check on you and address any questions or concerns that you may have regarding the information given to you following your procedure. If we do not reach you, we will leave a message.  However, if you are feeling well and you are not experiencing any problems, there is no need to return our call.  We will assume that you have returned to your regular daily activities without incident.  If any biopsies were taken you will be contacted by phone or by letter within the next 1-3 weeks.  Please call us at [phone] if you have not heard about the biopsies in 3 weeks.    SIGNATURES/CONFIDENTIALITY: You and/or your care partner have signed paperwork which will be entered into your electronic medical record.  These signatures attest to the fact that that the information above on your After Visit Summary has been reviewed and is understood.  Full responsibility of the confidentiality of this discharge information lies with you and/or your care-partner.   Resume medications. 

## 2016-05-26 NOTE — Progress Notes (Signed)
Called to room to assist during endoscopic procedure.  Patient ID and intended procedure confirmed with present staff. Received instructions for my participation in the procedure from the performing physician.  

## 2016-05-26 NOTE — Progress Notes (Signed)
Upon arriving to recovery pt. Was asleep,when she awaken within 5 minutes she c/o nausea and started spitting up clear secreations small amount doctor made aware and ordered zofran I.V. X1. zofran diluted with 2 N.S. syringes and administered per with relief. Pt. Given saltines crackers and juice and tolerated without difficulty.[id number] Pt. Stated that she feels better. Went over discharge instructions with pt. And family and pt. Discharge to home.

## 2016-05-26 NOTE — Op Note (Signed)
[facility] Patient Name: Deborah Chandler Procedure Date: 05/26/2016 2:48 PM MRN: [id number] Endoscopist: Viviann Spare P. Adela Lank , MD Age: 35 Referring MD:  Date of Birth: [birth year] Gender: Female Account #: [id number] Procedure:                Upper GI endoscopy Indications:              Upper abdominal symptoms that persist despite an                            appropriate treatment for H pylori, rule out PUD /                            persistent H pylori Medicines:                Monitored Anesthesia Care Procedure:                Pre-Anesthesia Assessment:                           - Prior to the procedure, a History and Physical                            was performed, and patient medications and                            allergies were reviewed. The patient's tolerance of                            previous anesthesia was also reviewed. The risks                            and benefits of the procedure and the sedation                            options and risks were discussed with the patient.                            All questions were answered, and informed consent                            was obtained. Prior Anticoagulants: The patient has                            taken no previous anticoagulant or antiplatelet                            agents. ASA Grade Assessment: II - A patient with                            mild systemic disease. After reviewing the risks                            and benefits, the patient was deemed in  satisfactory condition to undergo the procedure.                           After obtaining informed consent, the endoscope was                            passed under direct vision. Throughout the                            procedure, the patient's blood pressure, pulse, and                            oxygen saturations were monitored continuously. The                            Model GIF-HQ190  [id number]) scope was introduced                            through the mouth, and advanced to the second part                            of duodenum. The upper GI endoscopy was                            accomplished without difficulty. The patient                            tolerated the procedure well. Scope In: Scope Out: Findings:                 Esophagogastric landmarks were identified: the                            Z-line was found at 36 cm, the gastroesophageal                            junction was found at 36 cm and the site of hiatal                            narrowing was found at 36 cm from the incisors.                           The exam of the esophagus was otherwise normal.                           The entire examined stomach was normal. No focal                            ulcerations appreciated. Biopsies were taken with a                            cold forceps from the antrum / body for  Helicobacter pylori testing.                           The mucosa of the duodenal bulb and second portion                            of the duodenum had a lighter coloration to it but                            was otherwise normal. Biopsies were taken with a                            cold forceps for histology. Complications:            No immediate complications. Estimated blood loss:                            Minimal. Estimated Blood Loss:     Estimated blood loss was minimal. Impression:               - Esophagogastric landmarks identified.                           - Normal esophagus.                           - Normal stomach. Biopsied to rule out H pylori.                           - Normal duodenal bulb and second portion of the                            duodenum. Biopsied. Recommendation:           - Patient has a contact number available for                            emergencies. The signs and symptoms of potential                             delayed complications were discussed with the                            patient. Return to normal activities tomorrow.                            Written discharge instructions were provided to the                            patient.                           - Resume previous diet.                           - Continue present medications.                           -  Await pathology results.                           - Further recommendations pending the results. Viviann SpareSteven P. Armbruster, MD 05/26/2016 3:16:33 PM This report has been signed electronically.

## 2016-05-27 ENCOUNTER — Telehealth: Payer: Self-pay

## 2016-05-27 NOTE — Telephone Encounter (Signed)
  Follow up Call-  Call back number 05/26/2016  Post procedure Call Back phone  # [phone]  Permission to leave phone message Yes    Patient was called for follow up after her procedure on 05/26/2016. I spoke with the patients husband and her reports that Dois DavenportSandra has returned to her normal daily activities without any difficulty.

## 2016-05-28 ENCOUNTER — Encounter: Payer: Self-pay | Admitting: Family Medicine

## 2016-05-28 ENCOUNTER — Ambulatory Visit (INDEPENDENT_AMBULATORY_CARE_PROVIDER_SITE_OTHER): Payer: Medicaid Other | Admitting: Family Medicine

## 2016-05-28 VITALS — BP 132/73 | HR 82 | Temp 98.4°F | Ht 61.0 in | Wt 218.0 lb

## 2016-05-28 DIAGNOSIS — N898 Other specified noninflammatory disorders of vagina: Secondary | ICD-10-CM | POA: Insufficient documentation

## 2016-05-28 NOTE — Assessment & Plan Note (Addendum)
-  present after each menstrual cycle for about 6-7 days. Discharge not present during this visit, nor visible on pelvic exam today. Consider BV given foul smelling discharge, itching and given symptoms. Color likely due to blood-tinged discharge following menstrual cycle. -pain on defecation and sexual intercourse with history of heavy menstrual periods. Can consider workup for endometriosis. -recent STD testing all negative last month. Consider re-testing if symptoms persist. -referred to gynecology today -will continue to monitor

## 2016-05-28 NOTE — Progress Notes (Signed)
   Subjective:   Patient ID: Deborah Chandler    DOB: 01-23-1981, 35 y.o. female   MRN: [id number]  CC: vaginal discharge  HPI: Deborah Chandler is a 35 y.o. female who presents to clinic today for vaginal discharge she has been experiencing past few months. Always occurs with the end of her periods and lasts 6-7 days afterwards.  Reports a brown, foul smelling discharge that is thick in consistency and causes a lot of itching.  Occurs after every period and after having sex.  Denies burning on urination.  Denies recent illnesses, fevers, chills.  Denies nausea, vomiting.  Denies blood in stool or urine.  States she sometimes has pain during sexual intercourse and during defecation.  Her menstrual cycle is fairly regular however is a heavy and painful flow.  She experiences headaches during her periods, no cramping.  Had a surgical ligation about 10 years ago.  She is not currently having this discharge.  Her LMP was September 3rd.  Was recently tested with GC/Chlamydia probe and results of STD tests negative.    ROS: See HPI for pertinent ROS. All other systems reviewed and negative.  PMFSH: Pertinent past medical, surgical, family, and social history were reviewed and updated as appropriate. Smoking status reviewed.  Medications reviewed.  Objective:   BP 132/73   Pulse 82   Temp 98.4 F (36.9 C) (Oral)   Ht 5\' 1"  (1.549 m)   Wt 218 lb (98.9 kg)   LMP 05/09/2016 (Exact Date)   BMI 41.19 kg/m  Vitals and nursing note reviewed.  General: obese, well nourished, well developed, in no acute distress with non-toxic appearance HEENT: NCAT, MMM Neck: supple, non tender without LAD CV: RRR, without m/r/g Lungs: CTA B/L with normal work of breathing Abdomen: soft, NT, ND, no masses, +bs Pelvic: normal external genitalia, normal vaginal mucosa, non-erythematous, cervix pink, no discharge present, left-sided pelvic pain noted on bimanual exam.  Skin: warm, dry, no rashes or lesions,  cap refill < 2 seconds Extremities: warm and well perfused, normal tone  Assessment & Plan:   35 y.o F presents to clinic for follow up on a gynecologic issue. No other complaints at current time.  Vaginal discharge -present after each menstrual cycle for about 6-7 days. Discharge not present during this visit, nor visible on pelvic exam today. Consider BV given foul smelling discharge, itching and given symptoms. Color likely due to blood-tinged discharge following menstrual cycle. -pain on defecation and sexual intercourse with history of heavy menstrual periods. Can consider workup for endometriosis. -recent STD testing all negative last month. Consider re-testing if symptoms persist. -referred to gynecology today -will continue to monitor  Orders Placed This Encounter  Procedures  . Ambulatory referral to Gynecology    Referral Priority:   Routine    Referral Type:   Consultation    Referral Reason:   Specialty Services Required    Requested Specialty:   Gynecology    Number of Visits Requested:   1   No orders of the defined types were placed in this encounter.  Follow up: 1 month  Freddrick MarchYashika Juliane Guest, MD [facility], PGY-1 05/28/2016 8:25 PM

## 2016-06-07 ENCOUNTER — Encounter: Payer: Self-pay | Admitting: Obstetrics and Gynecology

## 2016-06-16 ENCOUNTER — Ambulatory Visit (INDEPENDENT_AMBULATORY_CARE_PROVIDER_SITE_OTHER): Payer: Medicaid Other | Admitting: Gastroenterology

## 2016-06-16 ENCOUNTER — Other Ambulatory Visit: Payer: Self-pay | Admitting: Emergency Medicine

## 2016-06-16 ENCOUNTER — Encounter: Payer: Self-pay | Admitting: Gastroenterology

## 2016-06-16 ENCOUNTER — Telehealth: Payer: Self-pay | Admitting: Family Medicine

## 2016-06-16 VITALS — BP 110/70 | HR 84 | Ht 61.0 in | Wt 217.8 lb

## 2016-06-16 DIAGNOSIS — R1013 Epigastric pain: Secondary | ICD-10-CM | POA: Diagnosis not present

## 2016-06-16 DIAGNOSIS — G8929 Other chronic pain: Secondary | ICD-10-CM | POA: Diagnosis not present

## 2016-06-16 MED ORDER — DESIPRAMINE HCL 25 MG PO TABS: 25.0000 mg | ORAL_TABLET | Freq: Every day | ORAL | 1 refills | Status: DC

## 2016-06-16 NOTE — Progress Notes (Signed)
     06/16/2016 Tomie ChinaSandra Wileman [id number] 03-29-1981   History of Present Illness:  35 year old female known to Dr. Adela LankArmbruster. Here for follow-up of epigastric abdominal pain.  Had EGD, which was normal and biopsies showed that her previous H. pylori was eradicated. Previously had CT scan was unremarkable. She had been taking twice a day PPI but continues with epigastric abdominal pain for the past 6 months described as burning/pulling sensation. No other associated symptoms. Dr. Lanetta InchArmbruster's recommendation was for trial of a tricyclic antidepressant. Because the patient is Spanish-speaking and requires an interpreter he thought it was best that she have an office visit to discuss this. Interpreter was present for her visit today.  Current Medications, Allergies, Past Medical History, Past Surgical History, Family History and Social History were [facility].   Physical Exam: BP 110/70 (BP Location: Right Arm, Patient Position: Sitting, Cuff Size: Normal)   Pulse 84   Ht 5\' 1"  (1.549 m)   Wt 217 lb 12.8 oz (98.8 kg)   LMP 06/14/2016   BMI 41.15 kg/m  General: Well developed female in no acute distress Head: Normocephalic and atraumatic Eyes:  Sclerae anicteric, conjunctiva pink  Ears: Normal auditory acuity Lungs: Clear throughout to auscultation Heart: Regular rate and rhythm Abdomen: Soft, non-distended.  Normal bowel sounds.  Mild epigastric TTP. Musculoskeletal: Symmetrical with no gross deformities  Extremities: No edema  Neurological: Alert oriented x 4, grossly non-focal Psychological:  Alert and cooperative. Normal mood and affect  Assessment and Recommendations: -Epigastric abdominal pain, now chronic with no other symptoms.  Negative EGD and CT scan.  Not improved with BID PPI.  Will try TCA once discussed with Dr. Adela LankArmbruster as to his preference and will have her follow-up in 8 weeks.

## 2016-06-16 NOTE — Telephone Encounter (Signed)
Patient asked for an appointment this Friday. She got upset with PCP because her period will be over on Friday but the next available appointment will be on October 24. Patient demands PCP to call her back. Please, follow up

## 2016-06-16 NOTE — Patient Instructions (Addendum)
We will send  medications to your pharmacy for you to pick up at your convenience.  Please keep your follow up with Dr. Adela LankArmbruster.

## 2016-06-16 NOTE — Progress Notes (Signed)
Agree with assessment and plan as outlined. Would recommend trial of TCA for dyspepsia given negative workup with EGD and CT to date. Would try desipramine 25mg  qHS for 2 weeks and then titrate to 50mg  as tolerated if no contraindications or medication interactions. She can follow up if symptoms persist.

## 2016-06-16 NOTE — Telephone Encounter (Signed)
Please tell patient that since I am not available until Oct 24 to try to schedule with another physician in the meantime. Thanks!

## 2016-07-14 ENCOUNTER — Encounter: Payer: Medicaid Other | Admitting: Obstetrics and Gynecology

## 2016-07-21 ENCOUNTER — Encounter: Payer: Self-pay | Admitting: Family Medicine

## 2016-07-21 ENCOUNTER — Other Ambulatory Visit (HOSPITAL_COMMUNITY)
Admission: RE | Admit: 2016-07-21 | Discharge: 2016-07-21 | Disposition: A | Discharge status: Home / Self Care | Payer: Medicaid Other | Source: Ambulatory Visit | Attending: Family Medicine | Admitting: Family Medicine

## 2016-07-21 ENCOUNTER — Ambulatory Visit (INDEPENDENT_AMBULATORY_CARE_PROVIDER_SITE_OTHER): Payer: Medicaid Other | Admitting: Family Medicine

## 2016-07-21 VITALS — BP 126/84 | HR 86 | Temp 98.0°F | Ht 61.0 in | Wt 222.0 lb

## 2016-07-21 DIAGNOSIS — Z124 Encounter for screening for malignant neoplasm of cervix: Secondary | ICD-10-CM | POA: Diagnosis not present

## 2016-07-21 DIAGNOSIS — N939 Abnormal uterine and vaginal bleeding, unspecified: Secondary | ICD-10-CM

## 2016-07-21 DIAGNOSIS — Z113 Encounter for screening for infections with a predominantly sexual mode of transmission: Secondary | ICD-10-CM | POA: Insufficient documentation

## 2016-07-21 DIAGNOSIS — Z1151 Encounter for screening for human papillomavirus (HPV): Secondary | ICD-10-CM | POA: Diagnosis present

## 2016-07-21 DIAGNOSIS — N7011 Chronic salpingitis: Secondary | ICD-10-CM

## 2016-07-21 DIAGNOSIS — N898 Other specified noninflammatory disorders of vagina: Secondary | ICD-10-CM | POA: Diagnosis not present

## 2016-07-21 DIAGNOSIS — Z01419 Encounter for gynecological examination (general) (routine) without abnormal findings: Secondary | ICD-10-CM | POA: Insufficient documentation

## 2016-07-21 LAB — POCT WET PREP (WET MOUNT)
Clue Cells Wet Prep Whiff POC: NEGATIVE
TRICHOMONAS WET PREP HPF POC: ABSENT

## 2016-07-21 NOTE — Patient Instructions (Addendum)
You were seen in clinic today for discharge after your periods.  You will be sent for an ultrasound of your pelvis to see what may be the reason.  In addition, some cultures were taken and will be sent to the lab.  I will follow up and call you with the results once they are available.  You also should keep your appointment with your gynecologist referral as this is the specialist that will help you with your vaginal discharge.  Follow up with me in 6 months or sooner if needed.

## 2016-07-21 NOTE — Progress Notes (Signed)
Subjective:   Patient ID: Deborah Chandler    DOB: 1981/02/11, 35 y.o. female   MRN: [id number]  CC: vaginal discharge  Video interpreter used --Spanish.  [place], [place] # [id number]  HPI: Deborah ChinaSandra Topp is a 35 y.o. female who presents to clinic today with cc of foul smelling vaginal discharge.  Reports that when her period stops, she experiences thick brown discharge that smells bad.  Has been happening following every period for the last few years.  Discharge lasts about 4-5 days following menstrual cycle.  Reports the discharge is not much in quantity, but the odor bothers her.  Denies vaginal pain, abdominal pain.  Itching present, lasts about 2-3 days following cycle then resolves.  Wants to know if we can prescribe a cream today for the itch.  Currently having her period.  States she is currently sexually active with 2 people, neither of which have any known history of STDs but she is unsure.  She does not currently use any form of contraception as she had a surgery about 15 years ago to have her "tubes burned."    Menstrual history: Started menstruating when she was [age], has a very heavy flow with heaviest days being 3rd and 4th day.  First 2 days are not so bad. Periods usually lasting 5-6 days.  Cramping severe especially on right side.  Takes Tylenol when she feels the pain coming on.   Was tested for GC chlamydia in 04/2016, results negative.  Will repeat at this visit along with additional labs.   ROS: See HPI for pertinent ROS.  PMFSH: Pertinent past medical, surgical, family, and social history were reviewed and updated as appropriate. Smoking status reviewed.  Medications reviewed. Current Outpatient Prescriptions  Medication Sig Dispense Refill  . Acetaminophen (TYLENOL) 325 MG CAPS Take 2 capsules by mouth every 8 (eight) hours.    Marland Kitchen. desipramine (NORPRAMIN) 25 MG tablet Take 1 tablet (25 mg total) by mouth daily. Take 25 mg for 2 weeks then take 50 mg. 90 tablet 1    No current facility-administered medications for this visit.    Objective:   BP 126/84   Pulse 86   Temp 98 F (36.7 C) (Oral)   Ht 5\' 1"  (1.549 m)   Wt 222 lb (100.7 kg)   LMP 07/14/2016   SpO2 99%   BMI 41.95 kg/m  Vitals and nursing note reviewed.  General: well nourished, well developed, in NAD with non-toxic appearance, appears older than stated age HEENT: NCAT, MMM, oropharynx clear  Neck: supple, non-tender without LAD CV: RRR, no MRG Lungs: CTA B/L with normal work of breathing Abdomen: soft, non-tender, no masses or organomegaly palpable, +bs GU:  normal external genitalia, normal vaginal mucosa, non-erythematous, cervix pink, brown colored discharge present, no pain noted on bimanual exam.  Skin: warm, dry, no rashes or lesions, cap refill < 2 seconds Extremities: warm and well perfused, normal tone Psych: mood appropriate  Assessment & Plan:   35 yo F presents to clinic for follow up on her vaginal discharge.   Vaginal discharge Patient with history of hydrosalpinx in 2016.  Consider ultrasound imaging to ensure resolution.   -Referred to Gynecology on 05/28/2016. Has not followed up and did not appear for gyn appointment last week.  -discharge blood-tinged and likely due to heavy menstrual periods -Pap smear -Wet prep -CG/Chlamydia ordered -Pelvic ultrasound ordered -encouraged patient to keep her appointment with Gynecologist -continue to monitor  Orders Placed This Encounter  Procedures  . [place]  Transvaginal Non-OB    Standing Status:   Future    Standing Expiration Date:   09/20/2017    Order Specific Question:   Reason for Exam (SYMPTOM  OR DIAGNOSIS REQUIRED)    Answer:   history of hydrosalpinx 2016    Order Specific Question:   Preferred imaging [facility]?    [facility]  . US Pelvis Complete    Standing Status:   Future    Standing Expiration Date:   09/20/2017    Order Specific Question:   Reason for Exam (SYMPTOM  OR DIAGNOSIS  REQUIRED)    Answer:   history of hydrosalpinx 2016    Order Specific Question:   Preferred imaging location?    [facility]  . POCT Wet Prep [facility] [place]   Follow up : 6 months  Freddrick MarchYashika Curvin Hunger, MD [facility], PGY-1 07/22/2016 6:45 PM

## 2016-07-22 NOTE — Assessment & Plan Note (Signed)
Patient with history of hydrosalpinx in 2016.  Consider ultrasound imaging to ensure resolution.   -Referred to Gynecology on 05/28/2016. Has not followed up and did not appear for gyn appointment last week.  -discharge blood-tinged and likely due to heavy menstrual periods -Pap smear -Wet prep -CG/Chlamydia ordered -Pelvic ultrasound ordered -encouraged patient to keep her appointment with Gynecologist -continue to monitor

## 2016-07-23 LAB — GC/CHLAMYDIA PROBE AMP (~~LOC~~) NOT AT ARMC
Chlamydia: NEGATIVE
Neisseria Gonorrhea: NEGATIVE

## 2016-07-23 LAB — CYTOLOGY - PAP
Diagnosis: NEGATIVE
HPV (WINDOPATH): NOT DETECTED

## 2016-08-03 ENCOUNTER — Ambulatory Visit (HOSPITAL_COMMUNITY)
Admission: RE | Admit: 2016-08-03 | Discharge: 2016-08-03 | Disposition: A | Discharge status: Home / Self Care | Payer: Medicaid Other | Source: Ambulatory Visit | Attending: Family Medicine | Admitting: Family Medicine

## 2016-08-03 DIAGNOSIS — N7011 Chronic salpingitis: Secondary | ICD-10-CM | POA: Diagnosis present

## 2016-08-03 DIAGNOSIS — R938 Abnormal findings on diagnostic imaging of other specified body structures: Secondary | ICD-10-CM | POA: Insufficient documentation

## 2016-08-11 ENCOUNTER — Encounter: Payer: Self-pay | Admitting: Vascular Surgery

## 2016-08-13 ENCOUNTER — Ambulatory Visit (INDEPENDENT_AMBULATORY_CARE_PROVIDER_SITE_OTHER): Payer: Medicaid Other | Admitting: Family Medicine

## 2016-08-13 ENCOUNTER — Encounter: Payer: Self-pay | Admitting: Family Medicine

## 2016-08-13 DIAGNOSIS — K219 Gastro-esophageal reflux disease without esophagitis: Secondary | ICD-10-CM | POA: Diagnosis present

## 2016-08-13 MED ORDER — OMEPRAZOLE 40 MG PO CPDR: 40.0000 mg | DELAYED_RELEASE_CAPSULE | Freq: Every day | ORAL | 3 refills | Status: DC

## 2016-08-13 NOTE — Patient Instructions (Signed)
Esofagitis (Esophagitis) La esofagitis es la inflamacin del esfago, el tubo que transporta los alimentos y los lquidos desde la boca hasta el Carlyleestmago. La esofagitis puede causar Counsellordolor en el esfago. Esta afeccin puede dificultar la deglucin y producir dolor al tragar. CAUSAS La mayora de las causas de la esofagitis no son graves. Las causas ms frecuentes de esta afeccin Wm. Wrigley Jr. Companyincluyen las siguientes:  Enfermedad por reflujo gastroesofgico (ERGE). Ocurre cuando el contenido estomacal regresa al esfago (reflujo).  Vmitos reiterados.  Neomia DearUna reaccin de tipo alrgico, especialmente causada por alergias alimentarias (esofagitis eosinoflica).  Lesiones esofgicas al ingerir comprimidos de gran tamao con o sin agua, o algunos tipos de medicamentos.  La ingestin de sustancias qumicas dainas, como productos de limpieza domstica.  El consumo excesivo de alcohol.  Una infeccin en el esfago,ms frecuente en las personas cuyo sistema inmunitario est debilitado.  Tratamiento Water quality scientistcontra el cncer, como la radioterapia o la quimioterapia.  Algunas enfermedades, como la sarcoidosis, la enfermedad de Crohn y la esclerodermia. SNTOMAS Los sntomas de esta afeccin incluyen lo siguiente:  Dificultad o dolor al tragar.  Dolor al tragar lquidos cidos, como los jugos de ctricos.  Dolor al Enterprise Productseructar.  Dolor en el pecho.  Dificultad para respirar.  Nuseas.  Vmitos.  Dolor en el abdomen.  Prdida de peso.  Llagas en la boca.  Manchas de una sustancia blanca en la boca (candidiasis).  Grant RutsFiebre.  Vmitos o tos con expectoracin de Retail buyersangre.  Heces negras, alquitranadas o de color rojo brillante. DIAGNSTICO El mdico le har una historia clnica y un examen fsico. Tambin pueden hacerle otros estudios, por ejemplo:  Una endoscopia para examinar el estmago y el esfago con Neomia Dearuna pequea cmara.  Un estudio que determina el nivel de acidez en el esfago.  Un estudio que mide  la presin que hay en el esfago.  Un estudio de deglucin de bario o un estudio modificado de deglucin de bario para mostrar la forma, el tamao y el funcionamiento del esfago.  Pruebas de alergia. TRATAMIENTO El tratamiento depende de la causa de la esofagitis. En algunos casos, se pueden administrar corticoides y otros medicamentos para ayudar a Paramedicaliviar los sntomas o tratar la causa preexistente de la afeccin. Es posible que sea necesario hacer algunos cambios en el estilo de vida, por ejemplo:  Evitar el alcohol.  Dejar de fumar.  Cambiar la dieta.  Hacer actividad fsica.  Modificar los hbitos de sueo y el entorno donde se duerme. INSTRUCCIONES PARA EL CUIDADO EN EL HOGAR Tome estas medidas para aliviar las molestias y Automotive engineerevitar las complicaciones. Dieta   Siga la dieta que le haya recomendado el mdico, la cual puede incluir evitar alimentos y bebidas tales como:  Caf y t (con o sin cafena).  Bebidas que contengan alcohol.  Bebidas energizantes y deportivas.  Gaseosas o refrescos.  Chocolate y cacao.  Menta y esencias de [address]menta.  Ajo y cebollas.  Rbano picante.  Alimentos muy condimentados y cidos, entre ellos, pimientos, [place]ub[place]ile en polvo, curry en polvo, vinagre, salsas picantes y [address]alsa barbacoa.  Frutas ctricas y sus jugos, como naranjas, limones y limas.  Alimentos a base de tomates, como salsa roja, [place]ub[place]ile, salsa y pizza con salsa roja.  Alimentos fritos y Lexicographergrasos, como rosquillas, papas fritas y aderezos con alto contenido de Holiday representativegrasa.  Carnes con alto contenido de Langleygrasa, como hot dogs y cortes grasos de carnes rojas y blancas, por ejemplo, filetes de entrecot, salchicha, jamn y tocino.  Productos lcteos con alto contenido de Three Lakesgrasa,  como leche Kingstonentera, Wauwatosamantequilla y queso crema.  Haga comidas pequeas y frecuentes Freight forwarderdurante el da en lugar de comidas abundantes.  Evite beber mucho lquido con las comidas.  No coma durante las 2 o 3horas previas a la  hora de Diamondacostarse.  No se acueste inmediatamente despus de comer.  No haga actividad fsica enseguida despus de comer.  Evite las comidas y las bebidas que parecen FedExempeorar los sntomas. Instrucciones generales   Est atento a cualquier cambio en los sntomas.  Tome los medicamentos de venta libre y los recetados solamente como se lo haya indicado el mdico. No tome aspirina, ibuprofeno ni otros antiinflamatorios no esteroides (AINE), a menos que se lo haya indicado el mdico.  Si tiene dificultar para tomar comprimidos, parta los comprimidos para reducir Brewing technologistsu tamao. Esto reducir la probabilidad de que el comprimido se atasque o lastime el esfago al pasar por all. Adems, beba agua despus de tomar un comprimido.  No consuma ningn producto que contenga tabaco, lo que incluye cigarrillos, tabaco de Theatre managermascar y Administrator, Civil Servicecigarrillos electrnicos. Si necesita ayuda para dejar de fumar, consulte al mdico.  Use ropas sueltas. No use prendas ajustadas alrededor de la cintura que ejerzan presin en el abdomen.  Levante (eleve) unas 6pulgadas (15centmetros) la cabecera de la cama.  Trate de reducir J. C. Penneyel nivel de estrs con actividades como el yoga o la meditacin. Si necesita ayuda para reducir J. C. Penneyel nivel de estrs, consulte al mdico.  Si tiene sobrepeso, Media planneradelgace hasta alcanzar un peso saludable. Hable con el mdico acerca de su peso ideal y pdale asesoramiento en cuanto a la dieta que debe seguir para Aeronautical engineerpoder alcanzarlo.  Concurra a todas las visitas de control como se lo haya indicado el mdico. Esto es importante. SOLICITE ATENCIN MDICA SI:  Aparecen nuevos sntomas.  Baja de peso sin causa aparente.  Tiene dificultad para tragar o siente dolor al Darden Restaurantshacerlo.  Tiene sibilancias o tos persistente.  Los sntomas no mejoran con Scientist, research (medical)el tratamiento.  Tiene acidez frecuente durante ms de Marsh & McLennandos semanas. SOLICITE ATENCIN MDICA DE INMEDIATO SI:  Tiene dolor intenso en los brazos, el cuello, los  Mantuamaxilares, la dentadura o la espalda.  Berenice Primasranspira, se marea o tiene sensacin de desvanecimiento.  Siente falta de aire o Journalist, newspaperdolor en el pecho.  Vomita y el vmito es parecido a la sangre o a los granos de caf.  Las heces son sanguinolentas o de color negro.  Tiene fiebre.  No puede tragar, beber o comer. Esta informacin no tiene Theme park managercomo fin reemplazar el consejo del mdico. Asegrese de hacerle al mdico cualquier pregunta que tenga. Document Released: 08/23/2005 Document Revised: 05/14/2015 Document Reviewed: 12/18/2014 Elsevier Interactive Patient Education  2017 [employer]vin[employer]Elsevier [employer]

## 2016-08-13 NOTE — Progress Notes (Signed)
   Subjective:   Patient ID: Deborah Chandler    DOB: 1981/05/24, 35 y.o. female   MRN: [id number]  CC: follow up on US results, epigastric pain  Spanish video interpreter used.  Annice PihJackie, Interpreter ID# [phone]  HPI: Deborah ChinaSandra Trainer is a 35 y.o. female who presents to clinic today for follow up on US results.  Was referred to gynecology and [facility] Dr. Jolayne Pantheronstant.  Ultrasound pelvis shows complex cyst/tubular structure 3.5 cm on left ovary. Discussed this with patient and she expressed wanting it removed as she has been having a lot of pain.  Patient also has had some abdominal pain x past 3 days.  Reports it is a burning pain in the epigastric area.  Has been eating and drinking normally.  Denies fever, chills, vomiting, diarrhea.  Has felt nauseous.  The pain does not radiate.  Denies chest pain,fevers, chills, shortness of breath.  Patient states she sometimes lays down right after eating and that she eats a good amount of spicy food.  ROS: See HPI for pertinent ROS.  PMFSH: Pertinent past medical, surgical, family, and social history were reviewed and updated as appropriate. Smoking status reviewed.  Medications reviewed. Current Outpatient Prescriptions  Medication Sig Dispense Refill  . Acetaminophen (TYLENOL) 325 MG CAPS Take 2 capsules by mouth every 8 (eight) hours.    Marland Kitchen. desipramine (NORPRAMIN) 25 MG tablet Take 1 tablet (25 mg total) by mouth daily. Take 25 mg for 2 weeks then take 50 mg. 90 tablet 1  . omeprazole (PRILOSEC) 40 MG capsule Take 1 capsule (40 mg total) by mouth daily. 30 capsule 3   No current facility-administered medications for this visit.    Objective:   BP 106/60   Pulse 83   Temp 97.9 F (36.6 C) (Oral)   Ht 5\' 1"  (1.549 m)   Wt 224 lb 6.4 oz (101.8 kg)   LMP 07/14/2016   SpO2 99%   BMI 42.40 kg/m  Vitals and nursing note reviewed.  General: obese, well nourished, well developed, in NAD with  non-toxic appearance HEENT: NCAT, MMM Neck: supple, non-tender without lymphadenopathy CV: RRR, no MRG Lungs: CTAB/L with normal work of breathing Abdomen: soft, no tenderness over epigastric area, no masses or organomegaly palpable, +bs Skin: warm, dry, no rashes or lesions, cap refill < 2 seconds Extremities: warm and well perfused, normal tone Psych: mood appropriate  Assessment & Plan:   34yo F presents to clinic for follow up.   GERD (gastroesophageal reflux disease) Patient with symptoms of reflux, burning epigastric pain present x3 days -Will try Omeprazole 40 mg  -Advised to avoid spicy foods, hot foods and eating too close to bedtime.  -If does not resolve, consider H.pylori workup -Continue to monitor  Of note, patient referred to Gynecology and has not followed up at prior appointment.  Advised to keep her appointment on Dec 20th.  She is agreeable.   Meds ordered this encounter  Medications  . omeprazole (PRILOSEC) 40 MG capsule    Sig: Take 1 capsule (40 mg total) by mouth daily.    Dispense:  30 capsule    Refill:  3   Follow up: 1 year or sooner if necessary  Freddrick MarchYashika Jonahtan Manseau, MD [facility], PGY-1 08/13/2016 5:15 PM

## 2016-08-13 NOTE — Assessment & Plan Note (Addendum)
Patient with symptoms of reflux, burning epigastric pain present x3 days -Will try Omeprazole 40 mg  -Advised to avoid spicy foods, hot foods and eating too close to bedtime.  -If does not resolve, consider H.pylori workup -Continue to monitor

## 2016-08-17 ENCOUNTER — Encounter: Payer: Self-pay | Admitting: Vascular Surgery

## 2016-08-17 ENCOUNTER — Ambulatory Visit (INDEPENDENT_AMBULATORY_CARE_PROVIDER_SITE_OTHER): Payer: Medicaid Other | Admitting: Vascular Surgery

## 2016-08-17 ENCOUNTER — Ambulatory Visit: Payer: Medicaid Other | Admitting: Gastroenterology

## 2016-08-17 VITALS — BP 132/90 | HR 81 | Temp 98.0°F | Resp 16 | Ht 61.0 in | Wt 226.0 lb

## 2016-08-17 DIAGNOSIS — I8393 Asymptomatic varicose veins of bilateral lower extremities: Secondary | ICD-10-CM | POA: Diagnosis not present

## 2016-08-17 NOTE — Progress Notes (Signed)
Subjective:     Patient ID: Deborah Chandler, female   DOB: 10-07-1980, 35 y.o.   MRN: [id number]  HPI This 35 year old Hispanic female is evaluated for varicose veins in both lower extremities.She is accompanied by an interpreter. She speaks no [place]. She was referred by Dr. Donnella ShamKyle Fletke. She has no history of DVT thrombophlebitis stasis ulcers or bleeding. She does not develop swelling as the day progresses. She does develop aching discomfort in both calves and down into the soles of both feet. She does not were elastic compression stockings. He has had no previous treatment of venous disease.  Past Medical History:  Diagnosis Date  . Anemia    ?  . Gastritis   . H. pylori infection   . Ovarian cyst     Social History  Substance Use Topics  . Smoking status: Never Smoker  . Smokeless tobacco: Never Used  . Alcohol use No    Family History  Problem Relation Age of Onset  . Asthma Mother   . Diabetes Father   . Diabetes Paternal Grandmother   . Diabetes Paternal Aunt     x 2  . Colon cancer Neg Hx     No Known Allergies   Current Outpatient Prescriptions:  .  Acetaminophen (TYLENOL) 325 MG CAPS, Take 2 capsules by mouth every 8 (eight) hours., Disp: , Rfl:  .  desipramine (NORPRAMIN) 25 MG tablet, Take 1 tablet (25 mg total) by mouth daily. Take 25 mg for 2 weeks then take 50 mg. (Patient not taking: Reported on 08/17/2016), Disp: 90 tablet, Rfl: 1 .  omeprazole (PRILOSEC) 40 MG capsule, Take 1 capsule (40 mg total) by mouth daily. (Patient not taking: Reported on 08/17/2016), Disp: 30 capsule, Rfl: 3  Vitals:   08/17/16 1544  BP: 132/90  Pulse: 81  Resp: 16  Temp: 98 F (36.7 C)  SpO2: 99%  Weight: 226 lb (102.5 kg)  Height: 5\' 1"  (1.549 m)    Body mass index is 42.7 kg/m.         Review of Systems Patient has chronic obesity. No history of chest pain, dyspnea on exertion, PND, orthopnea, hemoptysis    Objective:   Physical Exam BP 132/90    Pulse 81   Temp 98 F (36.7 C)   Resp 16   Ht 5\' 1"  (1.549 m)   Wt 226 lb (102.5 kg)   LMP 07/14/2016   SpO2 99%   BMI 42.70 kg/m     Gen.-alert and oriented x3 in no apparent distress-obese HEENT normal for age Lungs no rhonchi or wheezing Cardiovascular regular rhythm no murmurs carotid pulses 3+ palpable no bruits audible Abdomen soft nontender no palpable masses-obese Musculoskeletal free of  major deformities Skin clear -no rashes Neurologic normal Lower extremities 3+ femoral and dorsalis pedis pulses palpable bilaterally with no edema Bilateral spider veins in the thighs medially laterally and posteriorly. No bulging varicosities noted. No hyperpigmentation ulceration or distal edema noted.  Performed a bedside SonoSite ultrasound exam. Both great saphenous veins appear normal in caliber with no reflux noted       Assessment:     Bilateral spider veins-asymptomatic    Plan:     Discussed with patient treatment option would consist of foam sclerotherapy. Also told her that this was not medically necessary and would not relieve her lower leg discomfort  She will consider whether she would like to proceed with this and understands that it is not covered by any insurance

## 2016-08-25 ENCOUNTER — Ambulatory Visit (INDEPENDENT_AMBULATORY_CARE_PROVIDER_SITE_OTHER): Payer: Medicaid Other | Admitting: Obstetrics and Gynecology

## 2016-08-25 ENCOUNTER — Encounter: Payer: Self-pay | Admitting: Obstetrics and Gynecology

## 2016-08-25 ENCOUNTER — Other Ambulatory Visit (HOSPITAL_COMMUNITY)
Admission: RE | Admit: 2016-08-25 | Discharge: 2016-08-25 | Disposition: A | Discharge status: Home / Self Care | Payer: Medicaid Other | Source: Ambulatory Visit | Attending: Obstetrics and Gynecology | Admitting: Obstetrics and Gynecology

## 2016-08-25 VITALS — BP 134/74 | HR 61 | Wt 223.6 lb

## 2016-08-25 DIAGNOSIS — Z113 Encounter for screening for infections with a predominantly sexual mode of transmission: Secondary | ICD-10-CM | POA: Insufficient documentation

## 2016-08-25 DIAGNOSIS — N898 Other specified noninflammatory disorders of vagina: Secondary | ICD-10-CM

## 2016-08-25 NOTE — Progress Notes (Signed)
35 yo here for the evaluation of chronic vaginitis. Patient reports the presence of a pruritic, yellow and brownish discharge with an odor for several weeks. She is sexually active using BTL for contraception. She reports a monthly period lasting 5-6 days. She denies pelvic pain. She is without any other complaints  Past Medical History:  Diagnosis Date  . Anemia    ?  . Gastritis   . H. pylori infection   . Ovarian cyst    Past Surgical History:  Procedure Laterality Date  . WISDOM TOOTH EXTRACTION     one   Family History  Problem Relation Age of Onset  . Asthma Mother   . Diabetes Father   . Diabetes Paternal Grandmother   . Diabetes Paternal Aunt     x 2  . Colon cancer Neg Hx    Social History  Substance Use Topics  . Smoking status: Never Smoker  . Smokeless tobacco: Never Used  . Alcohol use No   ROS See pertinent in HPI  Blood pressure 134/74, pulse 61, weight 223 lb 9.6 oz (101.4 kg), last menstrual period 08/19/2016. GENERAL: Well-developed, well-nourished female in no acute distress.  ABDOMEN: Soft, nontender, nondistended. No organomegaly. PELVIC: Normal external female genitalia. Vagina is pink and rugated.  Normal discharge. Normal appearing cervix. Uterus is normal in size. No adnexal mass or tenderness. EXTREMITIES: No cyanosis, clubbing, or edema, 2+ distal pulses.  A/P 35 yo with vaginitis - Wet prep and cultures collected - Patient will be notified of abnormal results - Patient with normal pap smear this year - RTC prn

## 2016-08-26 LAB — WET PREP, GENITAL
TRICH WET PREP: NONE SEEN
Yeast Wet Prep HPF POC: NONE SEEN

## 2016-08-26 LAB — GC/CHLAMYDIA PROBE AMP (~~LOC~~) NOT AT ARMC
Chlamydia: NEGATIVE
NEISSERIA GONORRHEA: NEGATIVE

## 2016-08-27 ENCOUNTER — Other Ambulatory Visit: Payer: Self-pay | Admitting: Obstetrics and Gynecology

## 2016-08-27 MED ORDER — METRONIDAZOLE 500 MG PO TABS: 500.0000 mg | ORAL_TABLET | Freq: Two times a day (BID) | ORAL | 0 refills | Status: DC

## 2016-09-01 ENCOUNTER — Telehealth: Payer: Self-pay | Admitting: General Practice

## 2016-09-01 ENCOUNTER — Encounter: Payer: Self-pay | Admitting: General Practice

## 2016-09-01 NOTE — Telephone Encounter (Signed)
Per Dr Jolayne Pantheronstant, patient has BV & a prescription has been sent to her pharmacy. Called patient with pacific interpreter [phone] and her husband answered stating she wasn't in but provided her mobile number. Called patient there, no answer & voicemail was not set up. Will send letter

## 2016-12-07 ENCOUNTER — Ambulatory Visit: Payer: Medicaid Other | Admitting: Family Medicine

## 2016-12-09 ENCOUNTER — Encounter (HOSPITAL_COMMUNITY): Payer: Self-pay | Admitting: Emergency Medicine

## 2016-12-09 ENCOUNTER — Emergency Department (HOSPITAL_COMMUNITY)
Admission: EM | Admit: 2016-12-09 | Discharge: 2016-12-09 | Disposition: A | Discharge status: Home / Self Care | Payer: Medicaid Other | Attending: Emergency Medicine | Admitting: Emergency Medicine

## 2016-12-09 DIAGNOSIS — Z79899 Other long term (current) drug therapy: Secondary | ICD-10-CM | POA: Insufficient documentation

## 2016-12-09 DIAGNOSIS — R1084 Generalized abdominal pain: Secondary | ICD-10-CM | POA: Insufficient documentation

## 2016-12-09 LAB — COMPREHENSIVE METABOLIC PANEL
ALBUMIN: 3.4 g/dL — AB (ref 3.5–5.0)
ALT: 22 U/L (ref 14–54)
ANION GAP: 9 (ref 5–15)
AST: 19 U/L (ref 15–41)
Alkaline Phosphatase: 66 U/L (ref 38–126)
BUN: 13 mg/dL (ref 6–20)
CHLORIDE: 102 mmol/L (ref 101–111)
CO2: 28 mmol/L (ref 22–32)
Calcium: 8.9 mg/dL (ref 8.9–10.3)
Creatinine, Ser: 0.63 mg/dL (ref 0.44–1.00)
GFR calc Af Amer: >60 mL/min (ref >60)
GFR calc non Af Amer: >60 mL/min (ref >60)
GLUCOSE: 89 mg/dL (ref 65–99)
POTASSIUM: 4.2 mmol/L (ref 3.5–5.1)
SODIUM: 139 mmol/L (ref 135–145)
Total Bilirubin: 0.4 mg/dL (ref 0.3–1.2)
Total Protein: 7.2 g/dL (ref 6.5–8.1)

## 2016-12-09 LAB — URINALYSIS, ROUTINE W REFLEX MICROSCOPIC
BILIRUBIN URINE: NEGATIVE
Glucose, UA: NEGATIVE mg/dL
HGB URINE DIPSTICK: NEGATIVE
Ketones, ur: NEGATIVE mg/dL
Leukocytes, UA: NEGATIVE
Nitrite: NEGATIVE
PH: 5 (ref 5.0–8.0)
Protein, ur: NEGATIVE mg/dL
SPECIFIC GRAVITY, URINE: 1.01 (ref 1.005–1.030)

## 2016-12-09 LAB — CBC
HEMATOCRIT: 38.2 % (ref 36.0–46.0)
HEMOGLOBIN: 11.7 g/dL — AB (ref 12.0–15.0)
MCH: 23.7 pg — AB (ref 26.0–34.0)
MCHC: 30.6 g/dL (ref 30.0–36.0)
MCV: 77.5 fL — ABNORMAL LOW (ref 78.0–100.0)
Platelets: 283 10*3/uL (ref 150–400)
RBC: 4.93 MIL/uL (ref 3.87–5.11)
RDW: 14.5 % (ref 11.5–15.5)
WBC: 11.5 10*3/uL — ABNORMAL HIGH (ref 4.0–10.5)

## 2016-12-09 LAB — I-STAT BETA HCG BLOOD, ED (MC, WL, AP ONLY): I-stat hCG, quantitative: <5 m[IU]/mL (ref <5)

## 2016-12-09 LAB — LIPASE, BLOOD: Lipase: 22 U/L (ref 11–51)

## 2016-12-09 MED ORDER — ONDANSETRON HCL 4 MG PO TABS: 4.0000 mg | ORAL_TABLET | Freq: Four times a day (QID) | ORAL | 0 refills | Status: DC

## 2016-12-09 MED ORDER — OMEPRAZOLE 20 MG PO CPDR: 20.0000 mg | DELAYED_RELEASE_CAPSULE | Freq: Two times a day (BID) | ORAL | 0 refills | Status: DC

## 2016-12-09 MED ORDER — GI COCKTAIL ~~LOC~~
30.0000 mL | Freq: Once | ORAL | Status: AC
  Administered 2016-12-09: 30 mL via ORAL
  Filled 2016-12-09: qty 30

## 2016-12-09 NOTE — ED Provider Notes (Signed)
[facility]PT Provider Note   CSN: [id number] Arrival date & time: 12/09/16  1939     History   Chief Complaint Chief Complaint  Patient presents with  . Abdominal Pain    HPI Deborah Chandler is a 36 y.o. female.  Patient presents with complaint of recurrent generalized abdominal pain x 2 days that is associated with nausea without vomiting, and diarrhea 3-4 times daily. No melena or bloody stool. No fever. The discomfort comes and goes without known modifying factors. Currently the discomfort is limited to LUQ. She reports being previously diagnosed with gastritis as well as ulcers but is not prescribed medications on a daily basis. She denies alcohol use and is not a smoker. No chest pain, SOB, urinary symptoms.    The history is provided by the patient. A language interpreter was used (via Video interpreter).    Past Medical History:  Diagnosis Date  . Anemia    ?  . Gastritis   . H. pylori infection   . Ovarian cyst     Patient Active Problem List   Diagnosis Date Noted  . Spider veins of both lower extremities 08/17/2016  . Abdominal pain, chronic, epigastric 06/16/2016  . Dyspepsia 06/16/2016  . Vaginal discharge 05/28/2016  . Abdominal pain 05/03/2016  . Nausea without vomiting 03/16/2016  . GERD (gastroesophageal reflux disease) 02/04/2016  . Candidal intertrigo 02/04/2016  . Dysmenorrhea 03/20/2015  . Menorrhagia 03/20/2015    Past Surgical History:  Procedure Laterality Date  . WISDOM TOOTH EXTRACTION     one    OB History    Gravida Para Term Preterm AB Living   0 0 2   SAB TAB Ectopic Multiple Live Births   0 0 0 0         Home Medications    Prior to Admission medications   Medication Sig Start Date End Date Taking? Authorizing Provider  desipramine (NORPRAMIN) 25 MG tablet Take 1 tablet (25 mg total) by mouth daily. Take 25 mg for 2 weeks then take 50 mg. Patient not taking: Reported on 08/17/2016 06/16/16   Shanda Bumps D  Zehr, PA-C  metroNIDAZOLE (FLAGYL) 500 MG tablet Take 1 tablet (500 mg total) by mouth 2 (two) times daily. Patient not taking: Reported on 12/09/2016 08/27/16   Catalina Antigua, MD  omeprazole (PRILOSEC) 40 MG capsule Take 1 capsule (40 mg total) by mouth daily. Patient not taking: Reported on 08/17/2016 08/13/16   Freddrick March, MD    Family History Family History  Problem Relation Age of Onset  . Asthma Mother   . Diabetes Father   . Diabetes Paternal Grandmother   . Diabetes Paternal Aunt     x 2  . Colon cancer Neg Hx     Social History Social History  Substance Use Topics  . Smoking status: Never Smoker  . Smokeless tobacco: Never Used  . Alcohol use No     Allergies   Patient has no known allergies.   Review of Systems Review of Systems  Constitutional: Negative for chills and fever.  HENT: Negative.   Respiratory: Negative.  Negative for shortness of breath.   Cardiovascular: Negative.  Negative for chest pain.  Gastrointestinal: Positive for abdominal pain, diarrhea and nausea. Negative for blood in stool and vomiting.  Musculoskeletal: Negative.  Negative for myalgias.  Skin: Negative.   Neurological: Negative.      Physical Exam Updated Vital Signs BP 103/66 (BP Location: Left Arm)   Pulse 70  Temp 98.4 F (36.9 C) (Oral)   Resp 16   Ht  (1.549 m)   Wt 103.4 kg   LMP 11/28/2016   SpO2 100%   BMI 43.08 kg/m   Physical Exam  Constitutional: She appears well-developed and well-nourished.  HENT:  Head: Normocephalic.  Neck: Normal range of motion. Neck supple.  Cardiovascular: Normal rate and regular rhythm.   Pulmonary/Chest: Effort normal and breath sounds normal.  Abdominal: Soft. Bowel sounds are normal. There is tenderness (Minimal LUQ tenderness. ). There is no rebound and no guarding.  Musculoskeletal: Normal range of motion.  Neurological: She is alert. No cranial nerve deficit.  Skin: Skin is warm and dry. No rash noted.    Psychiatric: She has a normal mood and affect.     ED Treatments / Results  Labs (all labs ordered are listed, but only abnormal results are displayed) Labs Reviewed  COMPREHENSIVE METABOLIC PANEL - Abnormal; Notable for the following:       Result Value   Albumin 3.4 (*)    All other components within normal limits  CBC - Abnormal; Notable for the following:    WBC 11.5 (*)    Hemoglobin 11.7 (*)    MCV 77.5 (*)    MCH 23.7 (*)    All other components within normal limits  URINALYSIS, ROUTINE W REFLEX MICROSCOPIC - Abnormal; Notable for the following:    Color, Urine STRAW (*)    All other components within normal limits  LIPASE, BLOOD  I-STAT BETA HCG BLOOD, ED ([facility], [facility], [facility] ONLY)    EKG  EKG Interpretation None       Radiology No results found.  Procedures Procedures (including critical care time)  Medications Ordered in ED Medications  gi cocktail (Maalox,Lidocaine,Donnatal) (not administered)     Initial Impression / Assessment and Plan / ED Course  I have reviewed the triage vital signs and the nursing notes.  Pertinent labs & imaging results that were available during my care of the patient were reviewed by me and considered in my medical decision making (see chart for details).     Patient is very well appearing and is comfortable. No vomiting. She is having abdominal pain similar to previous with gastritis/ulcers. No vomiting. Labs reassuring. Will provide GI cocktail and discharge home on Prilosec BID. Will refer to GI. Discharge explained via interpreter and patient's questions answered and all concerns addressed.  Final Clinical Impressions(s) / ED Diagnoses   Final diagnoses:  None   1. Abdominal pain  New Prescriptions New Prescriptions   No medications on file     [place]ne [place] 12/09/16 2334    Marily Memos, MD 12/10/16 1037

## 2016-12-09 NOTE — ED Notes (Signed)
PA at bedside, [place]

## 2016-12-09 NOTE — ED Triage Notes (Signed)
Patient reports generalized abdominal pain with distention onset 3 days ago , mild nausea and occasional diarrhea . Denies fever or chills .

## 2016-12-21 ENCOUNTER — Emergency Department (HOSPITAL_COMMUNITY): Payer: Medicaid Other

## 2016-12-21 ENCOUNTER — Ambulatory Visit (INDEPENDENT_AMBULATORY_CARE_PROVIDER_SITE_OTHER): Payer: Self-pay | Admitting: Gastroenterology

## 2016-12-21 ENCOUNTER — Other Ambulatory Visit: Payer: Self-pay

## 2016-12-21 ENCOUNTER — Emergency Department (HOSPITAL_COMMUNITY)
Admission: EM | Admit: 2016-12-21 | Discharge: 2016-12-21 | Disposition: A | Discharge status: Home / Self Care | Payer: Medicaid Other | Attending: Emergency Medicine | Admitting: Emergency Medicine

## 2016-12-21 ENCOUNTER — Encounter: Payer: Self-pay | Admitting: Gastroenterology

## 2016-12-21 ENCOUNTER — Encounter (HOSPITAL_COMMUNITY): Payer: Self-pay | Admitting: Emergency Medicine

## 2016-12-21 VITALS — BP 130/100 | HR 75 | Ht 61.0 in | Wt 230.0 lb

## 2016-12-21 DIAGNOSIS — R197 Diarrhea, unspecified: Secondary | ICD-10-CM | POA: Insufficient documentation

## 2016-12-21 DIAGNOSIS — R112 Nausea with vomiting, unspecified: Secondary | ICD-10-CM | POA: Insufficient documentation

## 2016-12-21 DIAGNOSIS — R109 Unspecified abdominal pain: Secondary | ICD-10-CM

## 2016-12-21 DIAGNOSIS — R1010 Upper abdominal pain, unspecified: Secondary | ICD-10-CM

## 2016-12-21 DIAGNOSIS — J4520 Mild intermittent asthma, uncomplicated: Secondary | ICD-10-CM | POA: Diagnosis not present

## 2016-12-21 DIAGNOSIS — R1012 Left upper quadrant pain: Secondary | ICD-10-CM | POA: Diagnosis present

## 2016-12-21 LAB — URINALYSIS, ROUTINE W REFLEX MICROSCOPIC
Bilirubin Urine: NEGATIVE
GLUCOSE, UA: NEGATIVE mg/dL
HGB URINE DIPSTICK: NEGATIVE
Ketones, ur: NEGATIVE mg/dL
Leukocytes, UA: NEGATIVE
Nitrite: NEGATIVE
PROTEIN: NEGATIVE mg/dL
Specific Gravity, Urine: 1.042 — ABNORMAL HIGH (ref 1.005–1.030)
pH: 5 (ref 5.0–8.0)

## 2016-12-21 LAB — CBC
HCT: 37.8 % (ref 36.0–46.0)
Hemoglobin: 12 g/dL (ref 12.0–15.0)
MCH: 24.1 pg — AB (ref 26.0–34.0)
MCHC: 31.7 g/dL (ref 30.0–36.0)
MCV: 75.9 fL — AB (ref 78.0–100.0)
PLATELETS: 285 10*3/uL (ref 150–400)
RBC: 4.98 MIL/uL (ref 3.87–5.11)
RDW: 14.3 % (ref 11.5–15.5)
WBC: 11.6 10*3/uL — ABNORMAL HIGH (ref 4.0–10.5)

## 2016-12-21 LAB — COMPREHENSIVE METABOLIC PANEL
ALT: 23 U/L (ref 14–54)
AST: 20 U/L (ref 15–41)
Albumin: 3.5 g/dL (ref 3.5–5.0)
Alkaline Phosphatase: 62 U/L (ref 38–126)
Anion gap: 12 (ref 5–15)
BUN: 11 mg/dL (ref 6–20)
CHLORIDE: 99 mmol/L — AB (ref 101–111)
CO2: 25 mmol/L (ref 22–32)
CREATININE: 0.67 mg/dL (ref 0.44–1.00)
Calcium: 9.2 mg/dL (ref 8.9–10.3)
GFR calc Af Amer: >60 mL/min (ref >60)
Glucose, Bld: 74 mg/dL (ref 65–99)
Potassium: 4 mmol/L (ref 3.5–5.1)
SODIUM: 136 mmol/L (ref 135–145)
Total Bilirubin: 0.6 mg/dL (ref 0.3–1.2)
Total Protein: 7 g/dL (ref 6.5–8.1)

## 2016-12-21 LAB — I-STAT BETA HCG BLOOD, ED (MC, WL, AP ONLY): I-stat hCG, quantitative: <5 m[IU]/mL (ref <5)

## 2016-12-21 LAB — LIPASE, BLOOD: LIPASE: 23 U/L (ref 11–51)

## 2016-12-21 MED ORDER — IPRATROPIUM-ALBUTEROL 0.5-2.5 (3) MG/3ML IN SOLN
3.0000 mL | Freq: Once | RESPIRATORY_TRACT | Status: AC
  Administered 2016-12-21: 3 mL via RESPIRATORY_TRACT
  Filled 2016-12-21: qty 3

## 2016-12-21 MED ORDER — IOPAMIDOL (ISOVUE-300) INJECTION 61%
INTRAVENOUS | Status: AC
  Administered 2016-12-21: 100 mL
  Filled 2016-12-21: qty 100

## 2016-12-21 MED ORDER — PREDNISONE 50 MG PO TABS: ORAL_TABLET | ORAL | 0 refills | Status: DC

## 2016-12-21 MED ORDER — ONDANSETRON 4 MG PO TBDP: 4.0000 mg | ORAL_TABLET | Freq: Three times a day (TID) | ORAL | 0 refills | Status: DC | PRN

## 2016-12-21 MED ORDER — ONDANSETRON 4 MG PO TBDP
4.0000 mg | ORAL_TABLET | Freq: Once | ORAL | Status: AC
  Administered 2016-12-21: 4 mg via ORAL
  Filled 2016-12-21: qty 1

## 2016-12-21 MED ORDER — KETOROLAC TROMETHAMINE 15 MG/ML IJ SOLN
15.0000 mg | Freq: Once | INTRAMUSCULAR | Status: AC
Start: 2016-12-21 — End: 2016-12-21
  Administered 2016-12-21: 15 mg via INTRAVENOUS
  Filled 2016-12-21: qty 1

## 2016-12-21 MED ORDER — DIPHENHYDRAMINE HCL 50 MG/ML IJ SOLN
12.5000 mg | Freq: Once | INTRAMUSCULAR | Status: AC
  Administered 2016-12-21: 12.5 mg via INTRAVENOUS
  Filled 2016-12-21: qty 1

## 2016-12-21 MED ORDER — BENZONATATE 100 MG PO CAPS: 100.0000 mg | ORAL_CAPSULE | Freq: Three times a day (TID) | ORAL | 0 refills | Status: DC

## 2016-12-21 MED ORDER — ALBUTEROL SULFATE HFA 108 (90 BASE) MCG/ACT IN AERS
2.0000 | INHALATION_SPRAY | RESPIRATORY_TRACT | Status: DC | PRN
  Administered 2016-12-21: 2 via RESPIRATORY_TRACT
  Filled 2016-12-21: qty 6.7

## 2016-12-21 MED ORDER — SODIUM CHLORIDE 0.9 % IV SOLN
INTRAVENOUS | Status: AC
  Administered 2016-12-21: 17:00:00 via INTRAVENOUS

## 2016-12-21 NOTE — ED Notes (Signed)
Patient transported to X-ray 

## 2016-12-21 NOTE — Patient Instructions (Addendum)
Go to ER.  Normal BMI (Body Mass Index- based on height and weight) is between 19 and 25. Your BMI today is Body mass index is 43.46 kg/m. Marland Kitchen Please consider follow up  regarding your BMI with your Primary Care Provider.  Thank you

## 2016-12-21 NOTE — ED Provider Notes (Signed)
[facility] Provider Note   CSN: [id number] Arrival date & time: 12/21/16  1510  By signing my name below, I, Linna Darner, attest that this documentation has been prepared under the direction and in the presence of non-physician practitioner, [facility] M. Damian Leavell, NP. Electronically Signed: Linna Darner, Scribe. 12/21/2016. 4:36 PM.  History   Chief Complaint Chief Complaint  Patient presents with  . Abdominal Pain   The history is provided by the patient. A language interpreter was used.  Abdominal Pain   This is a new problem. The current episode started more than 2 days ago. The problem occurs daily. The problem has not changed since onset.The pain is associated with an unknown factor. The pain is located in the LUQ. The quality of the pain is burning. The pain is moderate. Associated symptoms include diarrhea, nausea, vomiting, frequency, headaches and myalgias. Pertinent negatives include fever and dysuria. The symptoms are aggravated by palpation. Nothing relieves the symptoms. Past workup includes GI consult. Her past medical history is significant for GERD.     HPI Comments: Deborah Chandler is a [id number] 36 y.o. female with PMHx including GERD, gastritis, and ovarian cyst who presents to the Emergency Department complaining of persistent, burning, LUQ pain for one week. She states her abdominal pain is worse with applied pressure to her LUQ. Pt reports associated nausea, vomiting, diarrhea, urinary frequency/urgency, polyuria, generalized body aches, cough, nasal congestion, global weakness, headaches, dizziness, and shaking chills. She also reports some intermittent SOB and wheezing since onset of her abdominal pain as well; she is a non-smoker with no h/o asthma and does not have any inhalers at home. She notes her urine has occasionally been malodorous over the last week. No alleviating factors noted and no medications/treatments tried. Patient had an appointment with  gastroenterology earlier today for her abdominal pain and was referred to the ED without receiving any prescriptions. She has been sexually active with one partner for ~13 years. No concern for STD's or gynecological problems. No h/o abdominal surgery or STD. She denies dysuria, difficulty urinating, vaginal discharge/bleeding, fevers, or any other associated symptoms.  Past Medical History:  Diagnosis Date  . Anemia    ?  . Gastritis   . H. pylori infection   . Ovarian cyst     Patient Active Problem List   Diagnosis Date Noted  . Diarrhea 12/21/2016  . Spider veins of both lower extremities 08/17/2016  . Abdominal pain, chronic, epigastric 06/16/2016  . Dyspepsia 06/16/2016  . Vaginal discharge 05/28/2016  . Abdominal pain in female patient 05/03/2016  . Nausea and vomiting in adult patient 03/16/2016  . GERD (gastroesophageal reflux disease) 02/04/2016  . Candidal intertrigo 02/04/2016  . Dysmenorrhea 03/20/2015  . Menorrhagia 03/20/2015    Past Surgical History:  Procedure Laterality Date  . WISDOM TOOTH EXTRACTION     one    OB History    Gravida Para Term Preterm AB Living   0 0 2   SAB TAB Ectopic Multiple Live Births   0 0 0 0         Home Medications    Prior to Admission medications   Medication Sig Start Date End Date Taking? Authorizing Provider  benzonatate (TESSALON) 100 MG capsule Take 1 capsule (100 mg total) by mouth every 8 (eight) hours. 12/21/16   Hope Orlene Och, NP  ondansetron (ZOFRAN ODT) 4 MG disintegrating tablet Take 1 tablet (4 mg total) by mouth every 8 (eight) hours as needed for  nausea or vomiting. 12/21/16   Hope Orlene Och, NP  predniSONE (DELTASONE) 50 MG tablet Take one tablet daily 12/21/16   Hope Orlene Och, NP    Family History Family History  Problem Relation Age of Onset  . Asthma Mother   . Diabetes Father   . Diabetes Paternal Grandmother   . Diabetes Paternal Aunt     x 2  . Colon cancer Neg Hx     Social  History Social History  Substance Use Topics  . Smoking status: Never Smoker  . Smokeless tobacco: Never Used  . Alcohol use No     Allergies   Patient has no known allergies.   Review of Systems Review of Systems  Constitutional: Positive for chills. Negative for fever.  HENT: Positive for congestion.   Respiratory: Positive for cough and shortness of breath.   Gastrointestinal: Positive for abdominal pain, diarrhea, nausea and vomiting.  Endocrine: Positive for polyuria.  Genitourinary: Positive for frequency and urgency. Negative for difficulty urinating, dysuria, vaginal bleeding and vaginal discharge.  Musculoskeletal: Positive for myalgias.  Neurological: Positive for weakness (global) and headaches.  All other systems reviewed and are negative.  Physical Exam Updated Vital Signs BP 115/73 (BP Location: Right Arm)   Pulse 83   Temp 97.6 F (36.4 C) (Oral)   Resp 16   LMP 11/28/2016   SpO2 100%   Physical Exam  Constitutional: She is oriented to person, place, and time. She appears well-developed and well-nourished. No distress.  HENT:  Head: Normocephalic and atraumatic.  Right Ear: Tympanic membrane normal.  Left Ear: Tympanic membrane normal.  Mouth/Throat: Uvula is midline. No posterior oropharyngeal edema or posterior oropharyngeal erythema.  Eyes: Conjunctivae and EOM are normal. Pupils are equal, round, and reactive to light. No scleral icterus.  Neck: Neck supple. No tracheal deviation present.  Cardiovascular: Normal rate and regular rhythm.   Pulmonary/Chest: Effort normal. No respiratory distress. She has wheezes in the left upper field, the left middle field and the left lower field.  Abdominal: Soft. Bowel sounds are normal. There is tenderness. There is no rebound and no guarding.  Tenderness in the LUQ. No guarding or rebound.  Musculoskeletal: Normal range of motion.  Neurological: She is alert and oriented to person, place, and time.  Skin: Skin  is warm and dry.  Psychiatric: She has a normal mood and affect. Her behavior is normal.  Nursing note and vitals reviewed.  ED Treatments / Results  Labs (all labs ordered are listed, but only abnormal results are displayed) Labs Reviewed  COMPREHENSIVE METABOLIC PANEL - Abnormal; Notable for the following:       Result Value   Chloride 99 (*)    All other components within normal limits  CBC - Abnormal; Notable for the following:    WBC 11.6 (*)    MCV 75.9 (*)    MCH 24.1 (*)    All other components within normal limits  URINALYSIS, ROUTINE W REFLEX MICROSCOPIC - Abnormal; Notable for the following:    Color, Urine STRAW (*)    Specific Gravity, Urine 1.042 (*)    All other components within normal limits  LIPASE, BLOOD  I-STAT BETA HCG BLOOD, ED ([facility], [facility], [facility] ONLY)     Radiology No results found.  Procedures Procedures (including critical care time)  DIAGNOSTIC STUDIES: Oxygen Saturation is 99% on RA, normal by my interpretation.    COORDINATION OF CARE: 4:58 PM Discussed treatment plan with pt at bedside and pt agreed  to plan.  7:28 PM: Lungs are clear after breathing treatment. She states her headache has resolved after receiving fluids. Pt is still having LUQ pain. Still waiting on results of urinalysis.  Medications Ordered in ED Medications  0.9 %  sodium chloride infusion ( Intravenous Stopped 12/21/16 2031)  ipratropium-albuterol (DUONEB) 0.5-2.5 (3) MG/3ML nebulizer solution 3 mL (3 mLs Nebulization Given 12/21/16 1814)  ondansetron (ZOFRAN-ODT) disintegrating tablet 4 mg (4 mg Oral Given 12/21/16 1714)  diphenhydrAMINE (BENADRYL) injection 12.5 mg (12.5 mg Intravenous Given 12/21/16 1714)  iopamidol (ISOVUE-300) 61 % injection (100 mLs  Contrast Given 12/21/16 1751)  ketorolac (TORADOL) 15 MG/ML injection 15 mg (15 mg Intravenous Given 12/21/16 2002)     Initial Impression / Assessment and Plan / ED Course  I have reviewed the triage vital signs and the  nursing notes.  Pertinent labs & imaging results that were available during my care of the patient were reviewed by me and considered in my medical decision making (see chart for details).   Final Clinical Impressions(s) / ED Diagnoses  36 y.o. female with abdominal pain, cough and wheezing and n/v/d stable for d/c without fever and does not appear toxic. CT and x-rays without acute findings.  Will treat for bronchitis and n/v/d. She will f/u with her PCP or return here as needed for worsening symptoms.  Final diagnoses:  Nausea vomiting and diarrhea  Pain of upper abdomen  Mild intermittent reactive airway disease with wheezing without complication    New Prescriptions Discharge Medication List as of 12/21/2016  8:23 PM    START taking these medications   Details  benzonatate (TESSALON) 100 MG capsule Take 1 capsule (100 mg total) by mouth every 8 (eight) hours., Starting Tue 12/21/2016, Print    ondansetron (ZOFRAN ODT) 4 MG disintegrating tablet Take 1 tablet (4 mg total) by mouth every 8 (eight) hours as needed for nausea or vomiting., Starting Tue 12/21/2016, Print    predniSONE (DELTASONE) 50 MG tablet Take one tablet daily, Print      I personally performed the services described in this documentation, which was scribed in my presence. The recorded information has been reviewed and is accurate.    [place], [place] 12/24/16 1643    Maia Plan, MD 12/26/16 [phone]

## 2016-12-21 NOTE — ED Notes (Signed)
Patient transported to CT. From xray.

## 2016-12-21 NOTE — ED Triage Notes (Signed)
Pt here for abd pain; pt hyperventilating upon arrival; pt sts pain x 1 week with nausea and some chills

## 2016-12-21 NOTE — Discharge Instructions (Signed)
Call and reschedule you appointment with [facility]. Take the medications as directed. Return as needed.

## 2016-12-21 NOTE — Progress Notes (Signed)
12/21/2016 Deborah Chandler [id number] 12-30-1980   HISTORY OF PRESENT ILLNESS:  This is a 36 year old Spanish-speaking female who is known to Dr. Adela Lank and myself. She's been seen here 3 times over the past year for complaints of upper abdominal pain and nausea. CT scan and EGD have been normal. Had been on PPI.  At her last visit in October 2017 she was prescribed desipramine as treatment for her chronic symptoms. She has not returned to our office since then. She is here today with ongoing complaints of nausea, abdominal pain, which she indicates is mostly in her left lower quadrant, and diarrhea which is a new complaint.  She is no longer on her PPI. She does not recall if she ever took the desipramine that she was given back in October.  She also had several more concerning non-GI complaints today including dizziness, headache, shortness of breath. Blood pressure is high. She complains of feeling weak.  Also, apparently her insurance is no longer valid. All recommendations that were made for her treatment plan, including evaluation of her GI complaints and ER evaluation for her other symptoms, were refuted by her daughter who kept reporting that they don't have money to pay for it.    Past Medical History:  Diagnosis Date  . Anemia    ?  . Gastritis   . H. pylori infection   . Ovarian cyst    Past Surgical History:  Procedure Laterality Date  . WISDOM TOOTH EXTRACTION     one    reports that she has never smoked. She has never used smokeless tobacco. She reports that she does not drink alcohol or use drugs. family history includes Asthma in her mother; Diabetes in her father, paternal aunt, and paternal grandmother. No Known Allergies    Outpatient Encounter Prescriptions as of 12/21/2016  Medication Sig  . [DISCONTINUED] desipramine (NORPRAMIN) 25 MG tablet Take 1 tablet (25 mg total) by mouth daily. Take 25 mg for 2 weeks then take 50 mg. (Patient not taking:  Reported on 08/17/2016)  . [DISCONTINUED] metroNIDAZOLE (FLAGYL) 500 MG tablet Take 1 tablet (500 mg total) by mouth 2 (two) times daily. (Patient not taking: Reported on 12/09/2016)  . [DISCONTINUED] omeprazole (PRILOSEC) 20 MG capsule Take 1 capsule (20 mg total) by mouth 2 (two) times daily before a meal.  . [DISCONTINUED] ondansetron (ZOFRAN) 4 MG tablet Take 1 tablet (4 mg total) by mouth every 6 (six) hours.   No facility-administered encounter medications on file as of 12/21/2016.      REVIEW OF SYSTEMS  : All other systems reviewed and negative except where noted in the History of Present Illness.   PHYSICAL EXAM: BP (!) 130/100   Pulse 75   Ht  (1.549 m)   Wt 230 lb (104.3 kg)   LMP 11/28/2016   BMI 43.46 kg/m  General: Well developed Hispanic female in no acute distress; appears uncomfortable.  Lying on the table covered with a blanket. Head: Normocephalic and atraumatic Eyes:  Sclerae anicteric, conjunctiva pink. Ears: Normal auditory acuity Lungs: Clear throughout to auscultation; slightly increased WOB at times. Heart: Regular rate and rhythm Abdomen: Soft, non-distended.  Normal bowel sounds.  Minimal diffuse TTP.  Overall abdominal exam was very benign. Musculoskeletal: Symmetrical with no gross deformities  Skin: No lesions on visible extremities Extremities: No edema  Neurological: Alert oriented x 4, grossly non-focal Psychological:  Alert and cooperative. Normal mood and affect  ASSESSMENT AND PLAN: -36 year old female with chronic  complaints of nausea and upper abdominal pain but now with complaints of lower abdominal pain and diarrhea.  Also more concerning complaints of headache, dizziness, weakness, shortness of breath. Blood pressure is high. I explained that I could do evaluation of her GI symptoms, but this would take a little bit of time. I recommend that she go to the ER for evaluation and treatment of her other non-GI related complaints. As stated  above, her insurance is no longer valid, which they just learned of today. With each of my recommendations the daughter just continued to say that they do not have money to pain for any of it.  Ultimately they decided to go to the ER.   CC:  Freddrick March, MD

## 2016-12-21 NOTE — Progress Notes (Signed)
Agree with the note as outlined. Patient with multiple complaints today, main issue is headache, dizziness, weakness, shortness of breath, noted to be hypertensive, and was directed to the ER. She has chronic abdominal pain / nausea which may be functional in etiology, unclear if she tried TCA or not. Family reported no insurance coverage in order to perform any further workup today, once this the insurance issue is sorted out I would be happy to see them in follow up for reassessment.

## 2017-02-26 ENCOUNTER — Inpatient Hospital Stay (HOSPITAL_COMMUNITY)
Admission: EM | Admit: 2017-02-26 | Discharge: 2017-02-28 | DRG: 194 | Disposition: A | Discharge status: Home / Self Care | Payer: Medicaid Other | Attending: Family Medicine | Admitting: Family Medicine

## 2017-02-26 ENCOUNTER — Encounter (HOSPITAL_COMMUNITY): Payer: Self-pay | Admitting: Emergency Medicine

## 2017-02-26 ENCOUNTER — Emergency Department (HOSPITAL_COMMUNITY): Payer: Medicaid Other

## 2017-02-26 DIAGNOSIS — R059 Cough, unspecified: Secondary | ICD-10-CM

## 2017-02-26 DIAGNOSIS — J189 Pneumonia, unspecified organism: Secondary | ICD-10-CM

## 2017-02-26 DIAGNOSIS — R0902 Hypoxemia: Secondary | ICD-10-CM | POA: Diagnosis present

## 2017-02-26 DIAGNOSIS — K219 Gastro-esophageal reflux disease without esophagitis: Secondary | ICD-10-CM | POA: Diagnosis present

## 2017-02-26 DIAGNOSIS — D649 Anemia, unspecified: Secondary | ICD-10-CM | POA: Diagnosis present

## 2017-02-26 DIAGNOSIS — Z6841 Body Mass Index (BMI) 40.0 and over, adult: Secondary | ICD-10-CM

## 2017-02-26 DIAGNOSIS — R05 Cough: Secondary | ICD-10-CM

## 2017-02-26 DIAGNOSIS — Z79899 Other long term (current) drug therapy: Secondary | ICD-10-CM

## 2017-02-26 HISTORY — DX: Pneumonia, unspecified organism: J18.9

## 2017-02-26 MED ORDER — ALBUTEROL SULFATE (2.5 MG/3ML) 0.083% IN NEBU
INHALATION_SOLUTION | RESPIRATORY_TRACT | Status: AC
  Filled 2017-02-26: qty 6

## 2017-02-26 MED ORDER — ONDANSETRON HCL 4 MG/2ML IJ SOLN
4.0000 mg | Freq: Once | INTRAMUSCULAR | Status: AC
  Administered 2017-02-27: 4 mg via INTRAVENOUS
  Filled 2017-02-26: qty 2

## 2017-02-26 MED ORDER — SODIUM CHLORIDE 0.9 % IV BOLUS (SEPSIS)
1000.0000 mL | Freq: Once | INTRAVENOUS | Status: AC
  Administered 2017-02-27: 1000 mL via INTRAVENOUS

## 2017-02-26 MED ORDER — ALBUTEROL SULFATE (2.5 MG/3ML) 0.083% IN NEBU
5.0000 mg | INHALATION_SOLUTION | Freq: Once | RESPIRATORY_TRACT | Status: AC
  Administered 2017-02-26: 5 mg via RESPIRATORY_TRACT

## 2017-02-26 NOTE — ED Provider Notes (Signed)
[facility]-EMERGENCY DEPT Provider Note   CSN: [id number] Arrival date & time: 02/26/17  2141  By signing my name below, I, Deborah Chandler, attest that this documentation has been prepared under the direction and in the presence of Pricilla Loveless, MD. Electronically Signed: Karren Cobble, ED Scribe. 02/27/17. 12:15 AM.  History   Chief Complaint Chief Complaint  Patient presents with  . Cough   The history is provided by the patient. A language interpreter was used (interpreter [phone]).    HPI Comments: Deborah Chandler is a 36 y.o. female with no pertinent history who presents to the Emergency Department complaining of gradually worsening cough with phlegm production that began three days ago. She notes associated fever, sore throat, body aches, headache, nausea, vomiting, and generalized burning abdominal pain. Pt reports three days ago she began to have a production cough that is worsening. Her associated symptoms began two days ago. She states her husband was recently sick and reports he had a cough as well. No treatment tried prior to arrival. No recent hospitalization.   Past Medical History:  Diagnosis Date  . Anemia    ?  . Gastritis   . H. pylori infection   . Ovarian cyst     Patient Active Problem List   Diagnosis Date Noted  . Pneumonia 02/27/2017  . Community acquired pneumonia of left lung   . Diarrhea 12/21/2016  . Spider veins of both lower extremities 08/17/2016  . Abdominal pain, chronic, epigastric 06/16/2016  . Dyspepsia 06/16/2016  . Vaginal discharge 05/28/2016  . Abdominal pain in female patient 05/03/2016  . Nausea and vomiting in adult patient 03/16/2016  . GERD (gastroesophageal reflux disease) 02/04/2016  . Candidal intertrigo 02/04/2016  . Dysmenorrhea 03/20/2015  . Menorrhagia 03/20/2015    Past Surgical History:  Procedure Laterality Date  . WISDOM TOOTH EXTRACTION     one    OB History    Gravida Para Term Preterm AB Living   2 2 2  0 0 2     SAB TAB Ectopic Multiple Live Births   0 0 0 0        Home Medications    Prior to Admission medications   Medication Sig Start Date End Date Taking? Authorizing Provider  albuterol (PROVENTIL HFA;VENTOLIN HFA) 108 (90 Base) MCG/ACT inhaler Inhale 1-2 puffs into the lungs every 6 (six) hours as needed for wheezing or shortness of breath.   Yes [provider]  guaiFENesin-dextromethorphan (ROBITUSSIN DM) 100-10 MG/5ML syrup Take 10 mLs by mouth every 4 (four) hours as needed for cough.   Yes [provider]  ibuprofen (ADVIL,MOTRIN) 200 MG tablet Take 600 mg by mouth every 6 (six) hours as needed for moderate pain.   Yes [provider]  ondansetron (ZOFRAN ODT) 4 MG disintegrating tablet Take 1 tablet (4 mg total) by mouth every 8 (eight) hours as needed for nausea or vomiting. 12/21/16  Yes Janne Napoleon, NP   Family History Family History  Problem Relation Age of Onset  . Asthma Mother   . Diabetes Father   . Diabetes Paternal Grandmother   . Diabetes Paternal Aunt        x 2  . Colon cancer Neg Hx    Social History Social History  Substance Use Topics  . Smoking status: Never Smoker  . Smokeless tobacco: Never Used  . Alcohol use No    Allergies   Patient has no known allergies.  Review of Systems Review of Systems  Constitutional:  Positive for fever.  HENT: Positive for sore throat.   Respiratory: Positive for cough.   Gastrointestinal: Positive for abdominal pain, nausea and vomiting.  Musculoskeletal: Positive for myalgias.  All other systems reviewed and are negative.  Physical Exam Updated Vital Signs BP 91/74 (BP Location: Right Arm)   Pulse (!) 103 Comment: RN notified  Temp (!) 101.2 F (38.4 C) Comment: RN notified  Resp 18   Ht 5\' 1"  (1.549 m)   Wt 104.3 kg (229 lb 14.4 oz)   LMP 01/24/2017   SpO2 100%   BMI 43.44 kg/m   Physical Exam  Constitutional: She is oriented to person, place, and time. She appears  well-developed and well-nourished.  HENT:  Head: Normocephalic and atraumatic.  Right Ear: External ear normal.  Left Ear: External ear normal.  Nose: Nose normal.  Mouth/Throat: Oropharynx is clear and moist.  Eyes: Right eye exhibits no discharge. Left eye exhibits no discharge.  Cardiovascular: Normal rate, regular rhythm and normal heart sounds.   Pulmonary/Chest: Effort normal. No accessory muscle usage. Tachypnea noted. She has rales (mild left sided rales). She exhibits no tenderness.  Abdominal: Soft. There is no tenderness.  Neurological: She is alert and oriented to person, place, and time.  Skin: Skin is warm and dry.  Nursing note and vitals reviewed.   ED Treatments / Results  DIAGNOSTIC STUDIES: Oxygen Saturation is 96% on RA, adequeate by my interpretation.   COORDINATION OF CARE: 11:55 PM-Discussed next steps with pt. Pt verbalized understanding and is agreeable with the plan.   Labs (all labs ordered are listed, but only abnormal results are displayed) Labs Reviewed  COMPREHENSIVE METABOLIC PANEL - Abnormal; Notable for the following:       Result Value   Glucose, Bld 131 (*)    Calcium 8.3 (*)    All other components within normal limits  CBC WITH DIFFERENTIAL/PLATELET - Abnormal; Notable for the following:    RBC 5.12 (*)    MCV 76.6 (*)    MCH 23.6 (*)    All other components within normal limits  CULTURE, BLOOD (ROUTINE X 2)  CULTURE, BLOOD (ROUTINE X 2)  GRAM STAIN  HIV ANTIBODY (ROUTINE TESTING)  STREP PNEUMONIAE URINARY ANTIGEN  I-STAT CG4 LACTIC ACID, ED  I-STAT BETA HCG BLOOD, ED ([facility], [facility], [facility] ONLY)    EKG  EKG Interpretation None      Radiology Dg Chest 2 View  Result Date: 02/26/2017 CLINICAL DATA:  Productive cough.  Fever, chills, body aches. EXAM: CHEST  2 VIEW COMPARISON:  Radiographs 12/21/2016 FINDINGS: Patchy opacities in the left mid lower lung zone consistent with pneumonia. Minimal right lower lobe atelectasis. Heart is  normal in size. No pleural fluid or pneumothorax. No osseous abnormality. IMPRESSION: Patchy pneumonia in the anterior left upper and left lower lobes. Electronically Signed   By: Rubye OaksMelanie  Ehinger M.D.   On: 02/26/2017 23:43   Procedures Procedures (including critical care time)  Medications Ordered in ED Medications  albuterol (PROVENTIL) (2.5 MG/3ML) 0.083% nebulizer solution (not administered)  enoxaparin (LOVENOX) injection 40 mg (not administered)  cefTRIAXone (ROCEPHIN) 1 g in dextrose 5 % 50 mL IVPB (not administered)  azithromycin (ZITHROMAX) tablet 500 mg (not administered)  benzonatate (TESSALON) capsule 200 mg (not administered)  ondansetron (ZOFRAN-ODT) disintegrating tablet 4 mg (4 mg Oral Given 02/27/17 0655)  albuterol (PROVENTIL) (2.5 MG/3ML) 0.083% nebulizer solution 5 mg (5 mg Nebulization Given 02/26/17 2212)  sodium chloride 0.9 % bolus 1,000 mL (0 mLs Intravenous Stopped  02/27/17 0235)  ondansetron (ZOFRAN) injection 4 mg (4 mg Intravenous Given 02/27/17 0012)  gi cocktail (Maalox,Lidocaine,Donnatal) (30 mLs Oral Given 02/27/17 0012)  benzonatate (TESSALON) capsule 200 mg (200 mg Oral Given 02/27/17 0012)  doxycycline (VIBRA-TABS) tablet 100 mg (100 mg Oral Given 02/27/17 0056)  promethazine (PHENERGAN) injection 25 mg (25 mg Intravenous Given 02/27/17 0134)  sodium chloride 0.9 % bolus 1,000 mL (0 mLs Intravenous Stopped 02/27/17 0548)  cefTRIAXone (ROCEPHIN) 1 g in dextrose 5 % 50 mL IVPB (0 g Intravenous Stopped 02/27/17 0507)  azithromycin (ZITHROMAX) 500 mg in dextrose 5 % 250 mL IVPB (0 mg Intravenous Stopped 02/27/17 0537)  acetaminophen (TYLENOL) tablet 650 mg (650 mg Oral Given 02/27/17 0546)   Initial Impression / Assessment and Plan / ED Course  I have reviewed the triage vital signs and the nursing notes.  Pertinent labs & imaging results that were available during my care of the patient were reviewed by me and considered in my medical decision making (see chart for  details).     Patient seems to be getting worse while in the ED. She was noted to have decreased oxygen saturation saturations at 90%. When ambulated this dropped into the low 80s. She is mildly tachypnea but no accessory muscle use. She was given IV fluids. She vomited after the doxycycline, unclear if she was able to tolerate this or not. Thus she was given IV antibiotics and will be admitted to the hospital for further supportive care for her pneumonia. At this point however she does not appear to need advanced airway management such as BiPAP or intubation. Admit to internal medicine teaching service   Final Clinical Impressions(s) / ED Diagnoses   Final diagnoses:  Community acquired pneumonia of left lung, unspecified part of lung    New Prescriptions Current Discharge Medication List     I personally performed the services described in this documentation, which was scribed in my presence. The recorded information has been reviewed and is accurate.     Pricilla Loveless, MD 02/27/17 [phone]

## 2017-02-26 NOTE — ED Notes (Signed)
Continuous coughing noted, neb tx started per protocol

## 2017-02-26 NOTE — ED Triage Notes (Addendum)
**  PT IS SPANISH SPEAKING-INTERPRETOR IPOD USED** Pt reports productive cough with bright red spots, body aches, chills, fever, sore throat,  HA, chest discomfort with cough x several days.  Grandson ill with similar s/s.

## 2017-02-27 DIAGNOSIS — J189 Pneumonia, unspecified organism: Secondary | ICD-10-CM | POA: Diagnosis present

## 2017-02-27 DIAGNOSIS — Z79899 Other long term (current) drug therapy: Secondary | ICD-10-CM | POA: Diagnosis not present

## 2017-02-27 DIAGNOSIS — Z6841 Body Mass Index (BMI) 40.0 and over, adult: Secondary | ICD-10-CM | POA: Diagnosis not present

## 2017-02-27 DIAGNOSIS — D649 Anemia, unspecified: Secondary | ICD-10-CM | POA: Diagnosis present

## 2017-02-27 DIAGNOSIS — K219 Gastro-esophageal reflux disease without esophagitis: Secondary | ICD-10-CM | POA: Diagnosis present

## 2017-02-27 DIAGNOSIS — R0902 Hypoxemia: Secondary | ICD-10-CM | POA: Diagnosis present

## 2017-02-27 LAB — CBC WITH DIFFERENTIAL/PLATELET
BASOS PCT: 0 %
Basophils Absolute: 0 10*3/uL (ref 0.0–0.1)
EOS ABS: 0.2 10*3/uL (ref 0.0–0.7)
EOS PCT: 4 %
HCT: 39.2 % (ref 36.0–46.0)
Hemoglobin: 12.1 g/dL (ref 12.0–15.0)
LYMPHS ABS: 2.3 10*3/uL (ref 0.7–4.0)
Lymphocytes Relative: 38 %
MCH: 23.6 pg — AB (ref 26.0–34.0)
MCHC: 30.9 g/dL (ref 30.0–36.0)
MCV: 76.6 fL — ABNORMAL LOW (ref 78.0–100.0)
MONO ABS: 0.5 10*3/uL (ref 0.1–1.0)
MONOS PCT: 8 %
NEUTROS PCT: 50 %
Neutro Abs: 3 10*3/uL (ref 1.7–7.7)
Platelets: 215 10*3/uL (ref 150–400)
RBC: 5.12 MIL/uL — ABNORMAL HIGH (ref 3.87–5.11)
RDW: 14.3 % (ref 11.5–15.5)
WBC: 6.1 10*3/uL (ref 4.0–10.5)

## 2017-02-27 LAB — I-STAT BETA HCG BLOOD, ED (MC, WL, AP ONLY)

## 2017-02-27 LAB — COMPREHENSIVE METABOLIC PANEL
ALT: 17 U/L (ref 14–54)
AST: 20 U/L (ref 15–41)
Albumin: 3.5 g/dL (ref 3.5–5.0)
Alkaline Phosphatase: 55 U/L (ref 38–126)
Anion gap: 8 (ref 5–15)
BUN: 9 mg/dL (ref 6–20)
CO2: 25 mmol/L (ref 22–32)
Calcium: 8.3 mg/dL — ABNORMAL LOW (ref 8.9–10.3)
Chloride: 103 mmol/L (ref 101–111)
Creatinine, Ser: 0.86 mg/dL (ref 0.44–1.00)
GFR calc Af Amer: >60 mL/min (ref >60)
GFR calc non Af Amer: >60 mL/min (ref >60)
Glucose, Bld: 131 mg/dL — ABNORMAL HIGH (ref 65–99)
Potassium: 3.6 mmol/L (ref 3.5–5.1)
Sodium: 136 mmol/L (ref 135–145)
Total Bilirubin: 0.3 mg/dL (ref 0.3–1.2)
Total Protein: 7.2 g/dL (ref 6.5–8.1)

## 2017-02-27 LAB — I-STAT CG4 LACTIC ACID, ED: Lactic Acid, Venous: 1.47 mmol/L (ref 0.5–1.9)

## 2017-02-27 LAB — HIV ANTIBODY (ROUTINE TESTING W REFLEX): HIV Screen 4th Generation wRfx: NONREACTIVE

## 2017-02-27 MED ORDER — ACETAMINOPHEN 325 MG PO TABS
650.0000 mg | ORAL_TABLET | Freq: Once | ORAL | Status: AC
  Administered 2017-02-27: 650 mg via ORAL
  Filled 2017-02-27: qty 2

## 2017-02-27 MED ORDER — IBUPROFEN 400 MG PO TABS
400.0000 mg | ORAL_TABLET | Freq: Four times a day (QID) | ORAL | Status: DC | PRN
  Administered 2017-02-27: 400 mg via ORAL
  Filled 2017-02-27: qty 1

## 2017-02-27 MED ORDER — ACETAMINOPHEN 325 MG PO TABS: 650.0000 mg | ORAL_TABLET | Freq: Four times a day (QID) | ORAL | Status: DC | PRN

## 2017-02-27 MED ORDER — BENZONATATE 100 MG PO CAPS
200.0000 mg | ORAL_CAPSULE | Freq: Three times a day (TID) | ORAL | Status: DC | PRN
  Administered 2017-02-27: 200 mg via ORAL
  Filled 2017-02-27: qty 2

## 2017-02-27 MED ORDER — DEXTROSE 5 % IV SOLN
500.0000 mg | Freq: Once | INTRAVENOUS | Status: AC
  Administered 2017-02-27: 500 mg via INTRAVENOUS
  Filled 2017-02-27: qty 500

## 2017-02-27 MED ORDER — GI COCKTAIL ~~LOC~~
30.0000 mL | Freq: Once | ORAL | Status: AC
  Administered 2017-02-27: 30 mL via ORAL
  Filled 2017-02-27: qty 30

## 2017-02-27 MED ORDER — BENZONATATE 100 MG PO CAPS
200.0000 mg | ORAL_CAPSULE | Freq: Once | ORAL | Status: AC
  Administered 2017-02-27: 200 mg via ORAL
  Filled 2017-02-27: qty 2

## 2017-02-27 MED ORDER — ONDANSETRON 4 MG PO TBDP
4.0000 mg | ORAL_TABLET | Freq: Three times a day (TID) | ORAL | Status: DC
  Administered 2017-02-27 – 2017-02-28 (×4): 4 mg via ORAL
  Filled 2017-02-27 (×4): qty 1

## 2017-02-27 MED ORDER — SODIUM CHLORIDE 0.9 % IV BOLUS (SEPSIS)
1000.0000 mL | Freq: Once | INTRAVENOUS | Status: AC
  Administered 2017-02-27: 1000 mL via INTRAVENOUS

## 2017-02-27 MED ORDER — AZITHROMYCIN 500 MG PO TABS
500.0000 mg | ORAL_TABLET | Freq: Every day | ORAL | Status: DC
  Administered 2017-02-28: 500 mg via ORAL
  Filled 2017-02-27: qty 1

## 2017-02-27 MED ORDER — DEXTROSE 5 % IV SOLN
1.0000 g | Freq: Once | INTRAVENOUS | Status: AC
  Administered 2017-02-27: 1 g via INTRAVENOUS
  Filled 2017-02-27: qty 10

## 2017-02-27 MED ORDER — DOXYCYCLINE HYCLATE 100 MG PO TABS
100.0000 mg | ORAL_TABLET | Freq: Once | ORAL | Status: AC
  Administered 2017-02-27: 100 mg via ORAL
  Filled 2017-02-27: qty 1

## 2017-02-27 MED ORDER — PROMETHAZINE HCL 25 MG/ML IJ SOLN
25.0000 mg | Freq: Once | INTRAMUSCULAR | Status: AC
  Administered 2017-02-27: 25 mg via INTRAVENOUS
  Filled 2017-02-27: qty 1

## 2017-02-27 MED ORDER — DEXTROSE 5 % IV SOLN
1.0000 g | INTRAVENOUS | Status: DC
  Administered 2017-02-28: 1 g via INTRAVENOUS
  Filled 2017-02-27: qty 10

## 2017-02-27 MED ORDER — ENOXAPARIN SODIUM 40 MG/0.4ML ~~LOC~~ SOLN
40.0000 mg | SUBCUTANEOUS | Status: DC
  Administered 2017-02-27 – 2017-02-28 (×2): 40 mg via SUBCUTANEOUS
  Filled 2017-02-27 (×2): qty 0.4

## 2017-02-27 NOTE — ED Notes (Signed)
Attempted to call report

## 2017-02-27 NOTE — Progress Notes (Signed)
NURSING PROGRESS NOTE  Deborah Chandler [id number] Admission Data: 02/27/2017 6:42 AM Attending Provider: No att. providers found WUJ:WJXBPCP:Amin, Murray HodgkinsYashika, MD Code Status: Full  Allergies:  Patient has no known allergies. Past Medical History:   has a past medical history of Anemia; Gastritis; H. pylori infection; and Ovarian cyst. Past Surgical History:   has a past surgical history that includes Wisdom tooth extraction. Social History:   reports that she has never smoked. She has never used smokeless tobacco. She reports that she does not drink alcohol or use drugs.  Deborah ChinaSandra Chandler is a 36 y.o. female patient admitted from ED:   Last Documented Vital Signs: Blood pressure 91/74, pulse (!) 103, temperature (!) 101.2 F (38.4 C), resp. rate 18, height 5\' 1"  (1.549 m), weight 104.3 kg (229 lb 14.4 oz), last menstrual period 01/24/2017, SpO2 100 %. .  IV Fluids:  IV in place, occlusive dsg intact without redness, IV cath antecubital right, condition patent and no redness none.   Skin: Appropriate for ethnicity and intact.  Patient/Family orientated to room. Information packet given to patient/family. Admission inpatient armband information verified with patient/family to include name and date of birth and placed on patient arm. Side rails up x 2, fall assessment and education completed with patient/family. Patient/family able to verbalize understanding of risk associated with falls and verbalized understanding to call for assistance before getting out of bed. Call light within reach. Patient/family able to voice and demonstrate understanding of unit orientation instructions.  Interpreter services used.  Interperter ID # [id number].  Will continue to evaluate and treat per MD orders.  Sue LushKaelin Romesberg RN, BSN

## 2017-02-27 NOTE — ED Notes (Signed)
Pt ambulated to bathroom. Becomes SHOB states her cough is consistent and she states she doesn't feel any better. The Ipad interp was used

## 2017-02-27 NOTE — Progress Notes (Signed)
  FPTS Interim Progress Note  S: Doing ok, still feels SOB. No chest pain. Coughing a lot, productive cough. No fevers or chills.   O: BP 91/74 (BP Location: Right Arm)   Pulse (!) 103 Comment: RN notified  Temp (!) 101.2 F (38.4 C) Comment: RN notified  Resp 18   Ht 5\' 1"  (1.549 m)   Wt 229 lb 14.4 oz (104.3 kg)   LMP 01/24/2017   SpO2 100%   BMI 43.44 kg/m    Gen: pleasant women sitting up in bed [place] in place, in NAD Heart: RRR no MRG Lungs: crackles in left lungs, no increased WOB Abdomen: soft, non-tender, non-distended, +BS Extremities: no edema or cyanosis Neuro: no focal deficits  Dg Chest 2 View  Result Date: 02/26/2017 CLINICAL DATA:  Productive cough.  Fever, chills, body aches. EXAM: CHEST  2 VIEW COMPARISON:  Radiographs 12/21/2016 FINDINGS: Patchy opacities in the left mid lower lung zone consistent with pneumonia. Minimal right lower lobe atelectasis. Heart is normal in size. No pleural fluid or pneumothorax. No osseous abnormality. IMPRESSION: Patchy pneumonia in the anterior left upper and left lower lobes. Electronically Signed   By: Rubye OaksMelanie  Ehinger M.D.   On: 02/26/2017 23:43    A/P: CAP -continue azitro and CTX -transition to oral abx 6/25 -Supplemental O2 prn to keep sats >90%, wean oxygen to room air -zofran and tylenol prn -consider additional fluids as needed, patient currently taking good PO -blood cx pending  Tillman SersRiccio, Caitlynne Harbeck C, DO 02/27/2017, 10:53 AM PGY-1, [facility] pager [phone]

## 2017-02-27 NOTE — H&P (Signed)
Family Medicine Teaching [facility] History and Physical Service Pager: [phone]  Patient name: Deborah Chandler Medical record number: [id number] Date of birth: 15-Feb-1981 Age: 36 y.o. Gender: female  Primary Care Provider: Freddrick MarchAmin, Yashika, MD Consultants: None  Code Status:   Chief Complaint: Productive cough,myalgias, fever  Assessment and Plan: Deborah Chandler is a 36 y.o. female with PMH GERD who presented with productive cough and fever and mylagias and found to have pneumonia.  #Community Acquired Pneumonia, acute Patient presented with productive cough, headache, fever and chills and shortness of breath for the past 3 days. Patient has been in contact with her husband and grand son who have had similar symptoms. On admission patient WBCs 6.1, lactic acid is 1.47, patient had a low grade fever measure at 100.51F however oxygen saturation was 86% . CXR showed patchy pneumonia in the anterior left upper and left lower lobes. Patient was started on ceftriaxone and azithromycin. Patient initially tolerated po, but had one episode of nausea and emesis while tried on po antibitiocs. Patient also had worsening respiratory status with ambulation in the ED dropping from low 90's to mid 80's. Given concern for dehydration and desaturation, patient was admitted for stabilization. --Admit to FMTS, admitting physician Dr.Eniola --Admit to Med Surg  --Continue Cetriaxone 1g daily --Continue Azithromycin 500 mg  Daily --Will transition to po antibiotics when tolerated (anticipate 6/25) --Follow up on am CBC and BMP --Zofran 4 mg prn  --Acetaminophen 650 mg q6 --IVF as needed --Follow up on blood cultures --O2 as needed  FEN/GI: Regular diet Prophylaxis: Lovenox  Disposition: Home pending clinical improvement  History of Present Illness:  Deborah Chandler is a 36 y.o. female presenting with cough, HA, central chest pain, subjective fever and chills, shortness of  breath since Wednesday. Reports she has been using albuterol, cough medication, and ibuprofen for symptoms without much relief. Reports of sick contacts: her husband has a cough and her grandson has a fever and cough. Reports of nausea but no vomiting at home. She did have a episode of emesis in the ED. Reports of some burning sensation diffusely in her abdomen; no radiation of this pain. No diarrhea. No dysuria or increased urinary frequency. Her chest pain is located centrally and is only with coughing.   In the ED, patient was febrile to 100.51F, tachycardic to 111, with elevated RR. She did not tolerate PO antibiotic in the ED and had emesis. She received 1L bolus and has 1 more Liter ordered. She was given a gi cocktail as well as zofran/phenergan. She had a desaturation of oxygen saturation to the low 90s to mid 80s with ambulation.  IV Azithromycin and CTX were started.   Review Of Systems: Per HPI with the following additions:  Review of Systems  Constitutional: Positive for chills and fever.  HENT: Positive for congestion.   Eyes: Negative.   Respiratory: Positive for cough and sputum production.   Cardiovascular: Positive for chest pain.  Gastrointestinal: Positive for abdominal pain and nausea.  Genitourinary: Negative.   Musculoskeletal: Positive for myalgias.  Skin: Negative.   Neurological: Negative.   Endo/Heme/Allergies: Negative.   Psychiatric/Behavioral: Negative.     Patient Active Problem List   Diagnosis Date Noted  . Diarrhea 12/21/2016  . Spider veins of both lower extremities 08/17/2016  . Abdominal pain, chronic, epigastric 06/16/2016  . Dyspepsia 06/16/2016  . Vaginal discharge 05/28/2016  . Abdominal pain in female patient 05/03/2016  . Nausea and vomiting in adult patient 03/16/2016  . GERD (  gastroesophageal reflux disease) 02/04/2016  . Candidal intertrigo 02/04/2016  . Dysmenorrhea 03/20/2015  . Menorrhagia 03/20/2015    Past Medical History: Past  Medical History:  Diagnosis Date  . Anemia    ?  . Gastritis   . H. pylori infection   . Ovarian cyst     Past Surgical History: Past Surgical History:  Procedure Laterality Date  . WISDOM TOOTH EXTRACTION     one    Social History: Social History  Substance Use Topics  . Smoking status: Never Smoker  . Smokeless tobacco: Never Used  . Alcohol use No   Additional social history:  Please also refer to relevant sections of EMR.  Family History: Family History  Problem Relation Age of Onset  . Asthma Mother   . Diabetes Father   . Diabetes Paternal Grandmother   . Diabetes Paternal Aunt        x 2  . Colon cancer Neg Hx     Allergies and Medications: No Known Allergies No current facility-administered medications on file prior to encounter.    Current Outpatient Prescriptions on File Prior to Encounter  Medication Sig Dispense Refill  . benzonatate (TESSALON) 100 MG capsule Take 1 capsule (100 mg total) by mouth every 8 (eight) hours. 21 capsule 0  . ondansetron (ZOFRAN ODT) 4 MG disintegrating tablet Take 1 tablet (4 mg total) by mouth every 8 (eight) hours as needed for nausea or vomiting. 20 tablet 0  . predniSONE (DELTASONE) 50 MG tablet Take one tablet daily 5 tablet 0    Objective: BP (!) 125/92   Pulse (!) 111   Temp 98.8 F (37.1 C) (Oral)   Resp (!) 30   Wt 229 lb 9 oz (104.1 kg)   LMP 01/24/2017   SpO2 (!) 86%   BMI 43.38 kg/m  Physical Exam:  General: Tired appearing woman with intermittent cough, NAD, able to participate in exam Cardiac: RRR, normal heart sounds, no murmurs. 2+ radial and PT pulses bilaterally Respiratory: CTAB, normal effort, No wheezes, rales or rhonchi Abdomen: Soft, mild tender to palpation in LUQ, nondistended, no hepatic or splenomegaly, +BS Extremities: no edema or cyanosis. WWP. Skin: warm and dry, no rashes noted Neuro: alert and oriented x4, no focal deficits Psych: Normal affect and mood   Labs and  Imaging: CBC BMET   Recent Labs Lab 02/26/17 0014  WBC 6.1  HGB 12.1  HCT 39.2  PLT 215    Recent Labs Lab 02/26/17 0014  NA 136  K 3.6  CL 103  CO2 25  BUN 9  CREATININE 0.86  GLUCOSE 131*  CALCIUM 8.3*     Lactic acid 1.47  Dg Chest 2 View  Result Date: 02/26/2017 CLINICAL DATA:  Productive cough.  Fever, chills, body aches. EXAM: CHEST  2 VIEW COMPARISON:  Radiographs 12/21/2016 FINDINGS: Patchy opacities in the left mid lower lung zone consistent with pneumonia. Minimal right lower lobe atelectasis. Heart is normal in size. No pleural fluid or pneumothorax. No osseous abnormality. IMPRESSION: Patchy pneumonia in the anterior left upper and left lower lobes. Electronically Signed   By: Rubye Oaks M.D.   On: 02/26/2017 23:43    Lovena Neighbours, MD 02/27/2017, 3:48 AM PGY-1, [facility] FPTS Intern pager: [phone], text pages welcome

## 2017-02-27 NOTE — ED Notes (Signed)
Pt SPO2 started at 90% and dropped to 85% while ambulating. Pt coughing when returning to bed.

## 2017-02-28 DIAGNOSIS — R05 Cough: Secondary | ICD-10-CM

## 2017-02-28 DIAGNOSIS — R059 Cough, unspecified: Secondary | ICD-10-CM

## 2017-02-28 DIAGNOSIS — K219 Gastro-esophageal reflux disease without esophagitis: Secondary | ICD-10-CM

## 2017-02-28 DIAGNOSIS — R0902 Hypoxemia: Secondary | ICD-10-CM

## 2017-02-28 LAB — CBC
HCT: 40 % (ref 36.0–46.0)
Hemoglobin: 12.1 g/dL (ref 12.0–15.0)
MCH: 23.4 pg — ABNORMAL LOW (ref 26.0–34.0)
MCHC: 30.3 g/dL (ref 30.0–36.0)
MCV: 77.4 fL — ABNORMAL LOW (ref 78.0–100.0)
PLATELETS: 205 10*3/uL (ref 150–400)
RBC: 5.17 MIL/uL — AB (ref 3.87–5.11)
RDW: 14.6 % (ref 11.5–15.5)
WBC: 5.7 10*3/uL (ref 4.0–10.5)

## 2017-02-28 LAB — BASIC METABOLIC PANEL
Anion gap: 8 (ref 5–15)
BUN: 7 mg/dL (ref 6–20)
CO2: 26 mmol/L (ref 22–32)
CREATININE: 0.68 mg/dL (ref 0.44–1.00)
Calcium: 8.4 mg/dL — ABNORMAL LOW (ref 8.9–10.3)
Chloride: 104 mmol/L (ref 101–111)
Glucose, Bld: 99 mg/dL (ref 65–99)
Potassium: 4.3 mmol/L (ref 3.5–5.1)
SODIUM: 138 mmol/L (ref 135–145)

## 2017-02-28 MED ORDER — GUAIFENESIN-DM 100-10 MG/5ML PO SYRP
5.0000 mL | ORAL_SOLUTION | ORAL | Status: DC
  Administered 2017-02-28: 5 mL via ORAL
  Filled 2017-02-28: qty 5

## 2017-02-28 MED ORDER — BENZONATATE 200 MG PO CAPS: 200.0000 mg | ORAL_CAPSULE | Freq: Three times a day (TID) | ORAL | 0 refills | Status: DC | PRN

## 2017-02-28 MED ORDER — ACETAMINOPHEN 325 MG PO TABS
650.0000 mg | ORAL_TABLET | Freq: Four times a day (QID) | ORAL | Status: DC
  Administered 2017-02-28: 650 mg via ORAL
  Filled 2017-02-28: qty 2

## 2017-02-28 MED ORDER — AZITHROMYCIN 250 MG PO TABS
250.0000 mg | ORAL_TABLET | Freq: Every day | ORAL | 0 refills | Status: DC
Start: 2017-03-01 — End: 2018-10-02

## 2017-02-28 NOTE — Progress Notes (Signed)
Deborah Chandler to be D/C'd to home per MD order.  Discussed with the patient using spanish healthcare interpreter and all questions fully answered.  VSS, Skin clean, dry and intact without evidence of skin break down, no evidence of skin tears noted. IV catheter discontinued intact. Site without signs and symptoms of complications. Dressing and pressure applied.  An After Visit Summary was printed and given to the patient. Patient received prescriptions and note for work.  D/c education completed with patient/family including follow up instructions, medication list, d/c activities limitations if indicated, with other d/c instructions as indicated by MD - patient able to verbalize understanding, all questions fully answered.   Patient instructed to return to ED, call 911, or call MD for any changes in condition.   Patient escorted via WC, and D/C home via private auto.  [name] 02/28/2017 4:59 PM

## 2017-02-28 NOTE — Discharge Summary (Signed)
Family Medicine [facility] Discharge Summary  Patient name: Deborah Chandler Medical record number: [id number] Date of birth: [birth year] Age: 36 y.o. Gender: female Date of Admission: 02/26/2017  Date of Discharge: 02/28/17  Admitting Physician: Doreene Eland, MD  Primary Care Provider: Freddrick March, MD Consultants: none  Indication for Hospitalization: hypoxia  Discharge Diagnoses/Problem List:  Patient Active Problem List   Diagnosis Date Noted  . Cough   . Hypoxia   . Pneumonia 02/27/2017  . Community acquired pneumonia of left lung   . Diarrhea 12/21/2016  . Spider veins of both lower extremities 08/17/2016  . Abdominal pain, chronic, epigastric 06/16/2016  . Dyspepsia 06/16/2016  . Vaginal discharge 05/28/2016  . Abdominal pain in female patient 05/03/2016  . Nausea and vomiting in adult patient 03/16/2016  . GERD (gastroesophageal reflux disease) 02/04/2016  . Candidal intertrigo 02/04/2016  . Dysmenorrhea 03/20/2015  . Menorrhagia 03/20/2015   Disposition: home  Discharge Condition: stable  Discharge Exam: see progress note from day of discharge  Brief Hospital Course:  Deborah Chandler is a 36 year old female with PMH of GERD and dysmenorrhea who presented to [facility] ED with productive cough and fevers found to have CAP. She was hypoxic in the ED with ambulation and admitted to [facility] for IV antibiotics and management. She was initially on 2L O2 via [place]. She improved with CTX and Azithromycin and was weaned to room air. She was given tylenol, ibuprofen, k-pad for pain and robitussin cough syrup and tessalon perles for cough. She was discharged to complete a 5 day course of azithromycin on 6/25 in stable condition with close PCP follow up.   Issues for Follow Up:  1. Follow up respiratory status/cough  Significant Procedures: none  Significant Labs and Imaging:   Recent Labs Lab 02/26/17 0014 02/28/17 0643  WBC 6.1 5.7  HGB 12.1  12.1  HCT 39.2 40.0  PLT 215 205    Recent Labs Lab 02/26/17 0014 02/28/17 0643  NA 136 138  K 3.6 4.3  CL 103 104  CO2 25 26  GLUCOSE 131* 99  BUN 9 7  CREATININE 0.86 0.68  CALCIUM 8.3* 8.4*  ALKPHOS 55  --   AST 20  --   ALT 17  --   ALBUMIN 3.5  --     Dg Chest 2 View  Result Date: 02/26/2017 CLINICAL DATA:  Productive cough.  Fever, chills, body aches. EXAM: CHEST  2 VIEW COMPARISON:  Radiographs 12/21/2016 FINDINGS: Patchy opacities in the left mid lower lung zone consistent with pneumonia. Minimal right lower lobe atelectasis. Heart is normal in size. No pleural fluid or pneumothorax. No osseous abnormality. IMPRESSION: Patchy pneumonia in the anterior left upper and left lower lobes. Electronically Signed   By: Rubye Oaks M.D.   On: 02/26/2017 23:43   Results/Tests Pending at Time of Discharge: none  Discharge Medications:  Allergies as of 02/28/2017   No Known Allergies     Medication List    TAKE these medications   albuterol 108 (90 Base) MCG/ACT inhaler Commonly known as:  PROVENTIL HFA;VENTOLIN HFA Inhale 1-2 puffs into the lungs every 6 (six) hours as needed for wheezing or shortness of breath.   azithromycin 250 MG tablet Commonly known as:  ZITHROMAX Take 1 tablet (250 mg total) by mouth daily. Start taking on:  03/01/2017   benzonatate 200 MG capsule Commonly known as:  TESSALON Take 1 capsule (200 mg total) by mouth 3 (three) times daily as needed  for cough.   guaiFENesin-dextromethorphan 100-10 MG/5ML syrup Commonly known as:  ROBITUSSIN DM Take 10 mLs by mouth every 4 (four) hours as needed for cough.   ibuprofen 200 MG tablet Commonly known as:  ADVIL,MOTRIN Take 600 mg by mouth every 6 (six) hours as needed for moderate pain.   ondansetron 4 MG disintegrating tablet Commonly known as:  ZOFRAN ODT Take 1 tablet (4 mg total) by mouth every 8 (eight) hours as needed for nausea or vomiting.       Discharge Instructions: Please  refer to Patient Instructions section of EMR for full details.  Patient was counseled important signs and symptoms that should prompt return to medical care, changes in medications, dietary instructions, activity restrictions, and follow up appointments.   Follow-Up Appointments: Follow-up Information    Garth Bignessimberlake, Kathryn, MD. Go on 03/07/2017.   Specialty:  Family Medicine Why:  at 1:30 for hospital fup Contact information: [address] [place] [address] [phone]           Tillman SersRiccio, Jermar Colter C, DO 02/28/2017, 2:49 PM PGY-1, [facility]

## 2017-02-28 NOTE — Progress Notes (Signed)
Family Medicine Teaching Service Daily Progress Note Intern Pager: [phone]  Patient name: Deborah Chandler Medical record number: [id number] Date of birth: [birth year] Age: 36 y.o. Gender: female  Primary Care Provider: Freddrick MarchAmin, Yashika, MD Consultants: none Code Status: full  Pt Overview and Major Events to Date:  6/24- admitted to FPTS  Assessment and Plan: Deborah Chandler is a 36 y.o. female with PMH GERD who presented with productive cough and fever and mylagias and found to have pneumonia.  #Community Acquired Pneumonia, acute- on room air, sats mid-90's. Complaining of pain with cough.  - d/c CTX, continue oral azithro to complete 5 day course  - Zofran 4 mg prn  - tylenol, ibuprofen as needed for pain - add cough syrup today - blood cultures NGTD   FEN/GI: Regular diet Prophylaxis: Lovenox  Disposition: home possibly today  Subjective:  Complaining of cough and sore throat, still feels slightly SOB. No fevers or chills.  Objective: Temp:  [98.2 F (36.8 C)] 98.2 F (36.8 C) (06/25 0547) Pulse Rate:  [87] 87 (06/25 0547) Resp:  [18-20] 20 (06/25 0547) BP: (124-126)/(80-81) 124/80 (06/25 0547) SpO2:  [93 %-94 %] 94 % (06/25 0547) Physical Exam: General: well nourished, well appearing adult in NAD Cardiovascular: RRR no MRG Respiratory: decreased air movement in lung bases bilaterally crackles lef mid-lung, no increased work of breathing Abdomen: soft NTND Extremities: no edema or cyanosis  Laboratory:  Recent Labs Lab 02/26/17 0014 02/28/17 0643  WBC 6.1 5.7  HGB 12.1 12.1  HCT 39.2 40.0  PLT 215 205    Recent Labs Lab 02/26/17 0014  NA 136  K 3.6  CL 103  CO2 25  BUN 9  CREATININE 0.86  CALCIUM 8.3*  PROT 7.2  BILITOT 0.3  ALKPHOS 55  ALT 17  AST 20  GLUCOSE 131*    HIV NR LA 1.47  Imaging/Diagnostic Tests: Dg Chest 2 View  Result Date: 02/26/2017 CLINICAL DATA:  Productive cough.  Fever, chills, body aches. EXAM: CHEST   2 VIEW COMPARISON:  Radiographs 12/21/2016 FINDINGS: Patchy opacities in the left mid lower lung zone consistent with pneumonia. Minimal right lower lobe atelectasis. Heart is normal in size. No pleural fluid or pneumothorax. No osseous abnormality. IMPRESSION: Patchy pneumonia in the anterior left upper and left lower lobes. Electronically Signed   By: Rubye OaksMelanie  Ehinger M.D.   On: 02/26/2017 23:43    Tillman SersRiccio, Khrystian Schauf C, DO 02/28/2017, 7:48 AM PGY-1, [facility] [facility] FPTS Intern pager: [phone], text pages welcome

## 2017-02-28 NOTE — Discharge Instructions (Signed)
Neumona extrahospitalaria en los adultos  (Community-Acquired Pneumonia, Adult)  La neumona es una infeccin en los pulmones. La neumona puede contagiarse mientras una persona est hospitalizada. Cuando una persona no est en el hospital, puede tener un tipo diferente de la enfermedad (neumona extrahospitalaria), la cual se transmite fcilmente de una persona a otra. Una persona puede contraer neumona extrahospitalaria si respira cerca de un individuo infectado que tose o estornuda. Algunos sntomas incluyen lo siguiente:   Tos seca.   Tos con expectoracin (productiva).   Fiebre.   Sudoracin.   Dolor en el pecho.  CUIDADOS EN EL HOGAR   Tome los medicamentos de venta libre y los recetados solamente como se lo haya indicado el mdico.  ? Tome los medicamentos para la tos solamente si no puede dormir bien.  ? Si le recetaron un antibitico, tmelo como se lo haya indicado el mdico. No deje de tomar los antibiticos aunque comience a sentirse mejor.   Duerma con la cabeza y el cuello elevados. Para lograrlo, colquese algunas almohadas debajo de la cabeza, o bien puede dormir en un silln reclinable.   No consuma productos que contengan tabaco. Estos incluyen cigarrillos, tabaco para mascar y cigarrillos electrnicos. Si necesita ayuda para dejar de fumar, consulte al mdico.   Beba suficiente agua para mantener la orina clara o de color amarillo plido.  Una vacuna puede ayudar a prevenir la neumona. Habitualmente, la vacuna se recomienda a:   Las personas mayores de 65aos.   Las personas mayores de 19aos:  ? Las personas que estn recibiendo tratamiento para el cncer.  ? Las personas que tienen una enfermedad pulmonar a largo plazo (crnica).  ? Las personas que tienen problemas del sistema de defensa del organismo (sistema inmunitario).  Tambin puede evitar la neumona si toma las siguientes medidas:   Se aplica la vacuna antigripal todos los aos.   Visita al dentista con la frecuencia  indicada.   Se lava las manos a menudo. Utilice un desinfectante para manos si no dispone de agua y jabn.  SOLICITE AYUDA SI:   Tiene fiebre.   No puede dormir bien porque el medicamento para la tos no resulta eficaz.  SOLICITE AYUDA DE INMEDIATO SI:   Le falta el aire y este sntoma empeora.   Aumenta el dolor en el pecho.   La enfermedad empeora. Esto es muy grave si:  ? Es un adulto mayor.  ? El sistema de defensa del organismo est debilitado.   Tose y escupe sangre.  Esta informacin no tiene como fin reemplazar el consejo del mdico. Asegrese de hacerle al mdico cualquier pregunta que tenga.  Document Released: 02/10/2010 Document Revised: 05/14/2015 Document Reviewed: 12/18/2014  Elsevier Interactive Patient Education  2018 Elsevier Inc.

## 2017-03-04 LAB — CULTURE, BLOOD (ROUTINE X 2)
CULTURE: NO GROWTH
CULTURE: NO GROWTH
Special Requests: ADEQUATE
Special Requests: ADEQUATE

## 2017-03-07 ENCOUNTER — Ambulatory Visit: Payer: Self-pay | Admitting: Family Medicine

## 2017-03-16 ENCOUNTER — Telehealth: Payer: Self-pay | Admitting: Family Medicine

## 2017-03-16 NOTE — Telephone Encounter (Signed)
Unsuccessful contact with pt. Left message requesting she call back to r/s HFU. - Mesha Guinyard

## 2017-09-06 DIAGNOSIS — D649 Anemia, unspecified: Secondary | ICD-10-CM

## 2017-09-06 HISTORY — DX: Anemia, unspecified: D64.9

## 2017-11-08 ENCOUNTER — Other Ambulatory Visit: Payer: Self-pay

## 2017-11-08 ENCOUNTER — Ambulatory Visit: Payer: Medicaid Other | Admitting: Internal Medicine

## 2017-11-08 VITALS — BP 122/78 | HR 93 | Temp 98.3°F | Wt 240.0 lb

## 2017-11-08 DIAGNOSIS — K219 Gastro-esophageal reflux disease without esophagitis: Secondary | ICD-10-CM

## 2017-11-08 MED ORDER — PANTOPRAZOLE SODIUM 20 MG PO TBEC: 20.0000 mg | DELAYED_RELEASE_TABLET | Freq: Two times a day (BID) | ORAL | 0 refills | Status: DC

## 2017-11-08 NOTE — Progress Notes (Signed)
   [facility] Noralee CharsAsiyah Mikell, MD Phone: [phone]  Reason For Visit:   Has had a sore throat and cough for the past 3 days.  State that she has reflux symptoms following eating during the day.  She also indicates having cough at night specifically. Patient indicates minimal nasal congestin . Patient denies any fevers. Patient has tried ibuprofen 500mg .  Patient states that this helped.  Denies any rashes or swollen glands denies any severe fatigue or muscle aches   Past Medical History Reviewed problem list.  Medications- reviewed and updated No additions to family history Social history- patient is a non-smoker  Objective: BP 122/78   Pulse 93   Temp 98.3 F (36.8 C) (Oral)   Wt 240 lb (108.9 kg)   LMP 10/09/2017   SpO2 98%   BMI 45.35 kg/m  Gen: NAD, alert, cooperative with exam HEENT: Normal    Neck: No masses palpated. No lymphadenopathy    Ears: Tympanic membranes intact, normal light reflex, no erythema, no bulging    Eyes: PERRLA, EOMI    Nose: nasal turbinates moist    Throat: moist mucus membranes, no erythema Cardio: regular rate and rhythm, S1S2 heard, no murmurs appreciated Pulm: clear to auscultation bilaterally, no wheezes, rhonchi or rales Skin: dry, intact, no rashes or lesions  Assessment/Plan: See problem based a/p  GERD (gastroesophageal reflux disease) Given history and symptoms will treat for reflux with protonix.  If no improvement patient to return for follow-up in 3-4 days

## 2017-11-08 NOTE — Patient Instructions (Signed)
Acidez estomacal  (Heartburn)  La acidez estomacal es un tipo de dolor o de molestia que se puede presentar en la garganta o en el pecho. A menudo se la describe como una quemazn. Tambin puede producir mal aliento. La sensacin de acidez puede empeorar al acostarse o inclinarse. Puede deberse al retroceso (reflujo) de los contenidos estomacales hacia el tubo que conecta la boca con el estmago (esfago).  CUIDADOS EN EL HOGAR  Tome estas medidas para aliviar las molestias y evitar los problemas.  Dieta   Siga la dieta como se lo haya indicado el mdico. Tal vez deba evitar los siguientes alimentos y bebidas:  ? Caf y t (con o sin cafena).  ? Bebidas que contengan alcohol.  ? Bebidas energizantes y deportivas.  ? Gaseosas o refrescos.  ? Chocolate y cacao.  ? Menta y esencias de menta.  ? Ajo y cebollas.  ? Rbano picante.  ? Alimentos muy condimentados y cidos, como pimientos, chile en polvo, curry en polvo, vinagre, salsas picantes y salsa barbacoa.  ? Frutas ctricas y sus jugos, como naranjas, limones y limas.  ? Alimentos a base de tomates, como salsa roja, chile, salsa y pizza con salsa roja.  ? Alimentos fritos y grasos, como rosquillas, papas fritas y aderezos con alto contenido de grasa.  ? Carnes con alto contenido de grasa, como hot dogs, filetes de entrecot, salchicha, jamn y tocino.  ? Productos lcteos con alto contenido de grasa, como leche entera, mantequilla y queso crema.   Consuma pequeas porciones de comida con ms frecuencia. Evite consumir porciones abundantes.   Evite beber mucho lquido con las comidas.   No coma durante las 2 o 3horas previas a la hora de acostarse.   No se acueste inmediatamente despus de comer.   No haga actividad fsica enseguida despus de comer.  Instrucciones generales   Est atento a cualquier cambio en los sntomas.   Tome los medicamentos de venta libre y los recetados solamente como se lo haya indicado el mdico. No tome aspirina, ibuprofeno ni  otros antiinflamatorios no esteroides (AINE), a menos que el mdico lo autorice.   No consuma ningn producto que contenga tabaco, lo que incluye cigarrillos, tabaco de mascar y cigarrillos electrnicos. Si necesita ayuda para dejar de fumar, consulte al mdico.   Use ropa suelta. No use nada ajustado alrededor de la cintura.   Levante (eleve) unas 6pulgadas (15centmetros) la cabecera de la cama.   Intente bajar el nivel de estrs. Si necesita ayuda para hacerlo, consulte al mdico.   Si tiene sobrepeso, adelgace hasta alcanzar un peso saludable. Pregntele a su mdico cmo puede perder peso de manera segura.   Concurra a todas las visitas de control como se lo haya indicado el mdico. Esto es importante.  SOLICITE AYUDA SI:   Aparecen nuevos sntomas.   Baja de peso y no sabe por qu.   Tiene dificultad para tragar o siente dolor al hacerlo.   Tiene sibilancias o tos que no desaparece.   Los sntomas no mejoran con el tratamiento.   Tiene acidez frecuentemente durante ms de dos semanas.  SOLICITE AYUDA DE INMEDIATO SI:   Tiene dolor en los brazos, el cuello, los maxilares, la dentadura o la espalda.   Transpira, se marea o tiene sensacin de desvanecimiento.   Siente falta de aire o dolor en el pecho.   Vomita y el vmito es parecido a la sangre o a los granos de caf.   Las heces son sanguinolentas   o de color negro.  Esta informacin no tiene como fin reemplazar el consejo del mdico. Asegrese de hacerle al mdico cualquier pregunta que tenga.  Document Released: 05/05/2011 Document Revised: 05/14/2015 Document Reviewed: 12/18/2014  Elsevier Interactive Patient Education  2018 Elsevier Inc.

## 2017-11-11 ENCOUNTER — Ambulatory Visit: Payer: Medicaid Other | Admitting: Family Medicine

## 2017-11-11 ENCOUNTER — Other Ambulatory Visit: Payer: Self-pay

## 2017-11-11 ENCOUNTER — Encounter: Payer: Self-pay | Admitting: Family Medicine

## 2017-11-11 VITALS — BP 120/92 | HR 80 | Temp 97.6°F | Ht 61.0 in | Wt 240.6 lb

## 2017-11-11 DIAGNOSIS — J069 Acute upper respiratory infection, unspecified: Secondary | ICD-10-CM | POA: Diagnosis present

## 2017-11-11 DIAGNOSIS — B9789 Other viral agents as the cause of diseases classified elsewhere: Secondary | ICD-10-CM | POA: Diagnosis not present

## 2017-11-11 MED ORDER — BENZONATATE 100 MG PO CAPS: 100.0000 mg | ORAL_CAPSULE | Freq: Two times a day (BID) | ORAL | 0 refills | Status: DC | PRN

## 2017-11-11 NOTE — Patient Instructions (Signed)
You were seen in clinic for viral symptoms.  As we discussed, please do not need an antibiotic.  I have prescribed you Tessalon Perles that you can take for your cough.  Your symptoms should get better within 7-10 days .  If you have new or worsening symptoms, you will need to be seen by provider.

## 2017-11-11 NOTE — Progress Notes (Signed)
   Subjective:   Patient ID: Deborah Chandler    DOB: November 26, 1980, 37 y.o. female   MRN: [id number]  CC: flu-like symptoms   HPI: Deborah Chandler is a 37 y.o. female who presents to clinic today for flu-like symptoms.  Cough Endorses symptoms have been present for 4 days.  Cough productive of green phlegm.  She also has a runny nose and congestion.  She reports subjective fever at home which she did not measure.  Has taken Tylenol for pain and fever.  No abdominal pain or diarrhea.  Reports back pain and body ache.  She reports her daughter was sick last week with the flu and her son and grandson also have a cough.  Daughter was seen and started on Tamiflu.  She is adamant about receiving a back injection during today's visit as she states her daughter also had one when she got diagnosed with the flu.  ROS: Denies chest pain, shortness of breath, palpitations, abdominal pain, diarrhea.  No rash.  Social: Patient is a never smoker Medications reviewed. Objective:   BP (!) 120/92   Pulse 80   Temp 97.6 F (36.4 C) (Oral)   Ht 5\' 1"  (1.549 m)   Wt 240 lb 9.6 oz (109.1 kg)   SpO2 98%   BMI 45.46 kg/m  Vitals and nursing note reviewed.  General: 37 year old female, appears disheveled, NAD HEENT: EOMI, PERRLA, rhinorrhea, MMM, oropharynx is clear without tonsillar erythema or exudate Neck: supple, nontender, normal range of motion CV: RRR no MRG Lungs: CTA B, normal effort Abdomen: soft, NT ND, positive bowel sounds Skin: warm, dry, no rash Extremities: warm and well perfused, normal tone  Assessment & Plan:   Viral URI Signs and symptoms likely consistent with viral URI versus influenza.  Discussed with patient that she is outside of the window for Tamiflu to be beneficial.  She states her daughter recently was diagnosed with the flu and received in a back injection for her body aches.  I explained to patient that this is not routinely the treatment for influenza however  patient became increasingly angry and accused me of "lying to her."  Informed patient that antibiotics are also not indicated with suspected viral URI and that this likely will resolve over 7-10 days. -Advised plenty of fluids -Rx: Tessalon Perles for cough -Encourage frequent handwashing to avoid spread  Freddrick MarchYashika Brittnie Lewey, MD [facility], PGY-2 11/11/2017 4:48 PM

## 2017-11-14 NOTE — Assessment & Plan Note (Signed)
Given history and symptoms will treat for reflux with protonix.  If no improvement patient to return for follow-up in 3-4 days

## 2017-12-21 ENCOUNTER — Ambulatory Visit: Payer: Medicaid Other | Admitting: Family Medicine

## 2017-12-29 ENCOUNTER — Ambulatory Visit: Payer: Medicaid Other | Admitting: Family Medicine

## 2018-01-02 ENCOUNTER — Ambulatory Visit: Payer: Medicaid Other | Admitting: Student

## 2018-01-12 ENCOUNTER — Ambulatory Visit: Payer: Medicaid Other | Admitting: Family Medicine

## 2018-01-20 ENCOUNTER — Ambulatory Visit (INDEPENDENT_AMBULATORY_CARE_PROVIDER_SITE_OTHER): Payer: Self-pay | Admitting: Family Medicine

## 2018-01-20 ENCOUNTER — Other Ambulatory Visit: Payer: Self-pay

## 2018-01-20 ENCOUNTER — Encounter: Payer: Self-pay | Admitting: Family Medicine

## 2018-01-20 ENCOUNTER — Ambulatory Visit: Payer: Medicaid Other | Admitting: Family Medicine

## 2018-01-20 VITALS — BP 132/74 | HR 86 | Temp 98.2°F | Ht 61.0 in | Wt 238.8 lb

## 2018-01-20 DIAGNOSIS — N920 Excessive and frequent menstruation with regular cycle: Secondary | ICD-10-CM

## 2018-01-20 NOTE — Patient Instructions (Addendum)
Mucho gusto Sra. Deborah Chandler!  Para sus sintomas durante sus reglas, vamos a usar una implanta que se llama Nexplanon.  Por favor, haz una cita para dar Nexplanon en una or [employer].  Muchas gracias, Dr. Frances Furbish  Etonogestrel implant Ladell Heads es este medicamento? El ETONOGESTREL es un dispositivo anticonceptivo (control de la natalidad). Se [place] para evitar los embarazos. Se puede usar hasta por 3 aos. Este medicamento puede ser utilizado para otros usos; si tiene alguna pregunta consulte con su proveedor de atencin mdica o con su farmacutico. MARCAS COMUNES: Implanon, Nexplanon Qu le debo informar a mi profesional de la salud antes de tomar este medicamento? Necesita saber si usted presenta alguno de los siguientes problemas o situaciones: sangrado vaginal anormal enfermedad vascular o cogulos sanguneos cncer de mama, cervical, heptico depresin diabetes enfermedad de la vescula biliar dolores de cabeza enfermedad cardiaca o ataque cardiaco reciente alta presin sangunea alto nivel de colesterol enfermedad renal enfermedad heptica convulsiones fuma tabaco una reaccin alrgica o inusual al etonogestrel, otras hormonas, anestsicos o antispticos, medicamentos, alimentos, colorantes o conservantes si est embarazada o buscando quedar embarazada si est amamantando a un beb Cmo debo [employer]? Un profesional de la salud inserta este dispositivo justo debajo de la piel en la parte interior del brazo. Hable con su pediatra para informarse acerca del uso de este medicamento en nios. Puede requerir atencin especial. Sobredosis: Pngase en contacto inmediatamente con un centro toxicolgico o una sala de urgencia si usted cree que haya tomado demasiado medicamento. ATENCIN: [employer] es solo para usted. No comparta este medicamento con nadie. Qu sucede si me olvido de una dosis? No se aplica en este caso. Qu puede interactuar con este medicamento? No tome  este medicamento con ninguno de los siguientes frmacos: amprenavir bosentano fosamprenavir Este medicamento tambin podra interactuar con los siguientes medicamentos: barbitricos para inducir el sueo o para el tratamiento de convulsiones ciertos medicamentos para infecciones micticas, tales como itraconazol y Associate Professor jugo de toronja griseofulvina medicamentos para tratar convulsiones, tales como [place], [place], [place], [place], topiramato modafinilo fenilbutazona rifampicina rufinamida algunos medicamentos para tratar la infeccin por el VIH, tales como atazanavir, indinavir, lopinavir, nelfinavir, tipranavir, ritonavir hierba de [address] ser que esta lista no menciona todas las posibles interacciones. Informe a su profesional de [employer] de [employer] productos a base de hierbas, medicamentos de [place] o suplementos nutritivos que est tomando. Si usted fuma, consume bebidas alcohlicas o si utiliza drogas ilegales, indqueselo tambin a su profesional de [employer]. Algunas sustancias pueden interactuar con su medicamento. A qu debo estar atento al usar [employer]? Este producto no protege contra la infeccin por el VIH (SIDA) u otras enfermedades de transmisin sexual. Usted debe sentir el implante al presionar con las yemas de los dedos sobre la piel donde se insert. Contacte a su mdico si no se siente el implante y [place] un mtodo anticonceptivo no hormonal (como el condn) hasta que el mdico confirma que el implante est en su Environmental consultant. Si siente que el implante puede haber roto o doblado en su brazo, pngase en contacto con su proveedor de atencin mdica. Qu efectos secundarios puedo tener al [employer] este medicamento? Efectos secundarios que debe informar a su mdico o a Producer, television/film/video de la salud tan pronto como sea posible: Therapist, art, como erupcin cutnea, picazn o urticarias, e hinchazn de la cara, los labios o la lengua bultos en las  mamas cambios en las emociones o el estado de nimo [place]  de nimo deprimido sangrado menstrual intenso o prolongado dolor, irritacin, hinchazn o moretones en el lugar de la insercin Immunologist de la insercin signos de infeccin en el sitio de insercin tales Engineer, maintenance (IT), y enrojecimiento de la piel, Engineer, mining o secrecin signos de Psychiatrist signos y sntomas de un cogulo sanguneo, tales como problemas respiratorios; cambios en la visin; dolor en el pecho; dolor de cabeza severo, repentino; dolor, hinchazn, calor en la pierna; dificultad para hablar; entumecimiento o debilidad repentina de la cara, el brazo o la pierna signos y sntomas de lesin al hgado, como orina amarilla oscura o [place]; sensacin general de estar enfermo o sntomas gripales; heces claras; prdida de apetito; nuseas; dolor en la regin abdominal superior derecha; cansancio o debilidad inusual; color amarillento de los ojos o la piel sangrado vaginal inusual, secrecin signos y sntomas de un derrame cerebral, tales como cambios en la visin; confusin; dificultad para hablar o entender; dolores de cabeza severos; entumecimiento o debilidad repentina de la cara, el brazo o la pierna; problemas al Advertising account planner; Psychiatrist; prdida del equilibrio o la coordinacin Efectos secundarios que generalmente no requieren Psychologist, prison and probation services (infrmelos a su mdico o a Producer, television/film/video de la salud si persisten o si son molestos): acn dolor de Retail buyer en las mamas cambios de peso mareos sensacin general de estar enfermo o sntomas gripales dolor de cabeza sangrado menstrual irregular nuseas dolor de garganta irritacin o inflamacin vaginal Puede ser que esta lista no menciona todos los posibles efectos secundarios. Comunquese a su mdico por asesoramiento mdico [employer]. Usted puede informar los efectos secundarios a la FDA por telfono al 1-800-FDA-1088. Dnde debo guardar mi medicina? Este medicamento se administra en  hospitales o clnicas y no necesitar guardarlo en su domicilio. ATENCIN: Este folleto es un resumen. Puede ser que no cubra toda la posible informacin. Si usted tiene preguntas acerca de esta medicina, consulte con su mdico, su farmacutico o su profesional de Radiographer, therapeutic.  2018 Elsevier/Gold Standard (2016-09-23 00:00:00)

## 2018-01-20 NOTE — Progress Notes (Signed)
   Subjective:    Deborah Chandler - 37 y.o. female MRN [id number]  Date of birth: Jan 08, 1981  History was assisted by [facility] [phone]  HPI  Deborah Chandler is here for menstrual cramps, menorrhagia, and menstrual headaches.  These symptoms have been occurring for years.  She does not use contraception because she got a tubal ligation several years ago.  She is interested in using hormonal contraception to alleviate her menstrual symptoms; however, she does not want OCPs because she will not remember to take them.  She is interested in the Nexplanon.  Health Maintenance:  There are no preventive care reminders to display for this patient.  -  reports that she has never smoked. She has never used smokeless tobacco. - Review of Systems: Per HPI. - Past Medical History: Patient Active Problem List   Diagnosis Date Noted  . Cough   . Hypoxia   . Pneumonia 02/27/2017  . Community acquired pneumonia of left lung   . Diarrhea 12/21/2016  . Spider veins of both lower extremities 08/17/2016  . Abdominal pain, chronic, epigastric 06/16/2016  . Dyspepsia 06/16/2016  . Vaginal discharge 05/28/2016  . Abdominal pain in female patient 05/03/2016  . Nausea and vomiting in adult patient 03/16/2016  . GERD (gastroesophageal reflux disease) 02/04/2016  . Candidal intertrigo 02/04/2016  . Dysmenorrhea 03/20/2015  . Menorrhagia 03/20/2015   - Medications: reviewed and updated   Objective:   Physical Exam BP 132/74   Pulse 86   Temp 98.2 F (36.8 C) (Oral)   Ht  (1.549 m)   Wt 238 lb 12.8 oz (108.3 kg)   LMP 01/14/2018   SpO2 98%   BMI 45.12 kg/m  Gen: NAD, alert, cooperative with exam, well-appearing HEENT: NCAT, clear conjunctiva, supple neck CV: RRR, good S1/S2, no murmur Resp: CTABL, no wheezes, non-labored Abd: SNTND, BS present, no guarding or organomegaly Skin: no rashes, normal turgor  Neuro: no gross deficits.  Psych: good insight, alert and  oriented        Assessment & Plan:   Menorrhagia Options for alleviating patient's symptoms described.  Patient was told that OCPs would likely be the most effective but that Nexplanon and Mirena may also decrease her bleeding and other menstrual symptoms.  After patient elected to try Nexplanon, she was advised that Nexplanon can cause menstrual irregularity especially in the first 3-6 months but usually these resolve.  Patient would like a Nexplanon and will make an appointment to have this inserted at her earliest convenience.    Lezlie Octave, M.D. 01/22/2018, 6:59 PM PGY-1, [facility]

## 2018-01-22 NOTE — Assessment & Plan Note (Signed)
Options for alleviating patient's symptoms described.  Patient was told that OCPs would likely be the most effective but that Nexplanon and Mirena may also decrease her bleeding and other menstrual symptoms.  After patient elected to try Nexplanon, she was advised that Nexplanon can cause menstrual irregularity especially in the first 3-6 months but usually these resolve.  Patient would like a Nexplanon and will make an appointment to have this inserted at her earliest convenience.

## 2018-02-06 ENCOUNTER — Ambulatory Visit (INDEPENDENT_AMBULATORY_CARE_PROVIDER_SITE_OTHER): Payer: Medicaid Other | Admitting: Family Medicine

## 2018-02-06 ENCOUNTER — Ambulatory Visit: Payer: Medicaid Other | Admitting: Family Medicine

## 2018-02-06 ENCOUNTER — Other Ambulatory Visit: Payer: Self-pay

## 2018-02-06 ENCOUNTER — Encounter: Payer: Self-pay | Admitting: Family Medicine

## 2018-02-06 VITALS — BP 130/76 | HR 78 | Temp 98.7°F | Ht 61.0 in | Wt 241.6 lb

## 2018-02-06 DIAGNOSIS — Z3046 Encounter for surveillance of implantable subdermal contraceptive: Secondary | ICD-10-CM | POA: Diagnosis not present

## 2018-02-06 DIAGNOSIS — Z3009 Encounter for other general counseling and advice on contraception: Secondary | ICD-10-CM

## 2018-02-06 DIAGNOSIS — Z30019 Encounter for initial prescription of contraceptives, unspecified: Secondary | ICD-10-CM

## 2018-02-06 LAB — POCT URINE PREGNANCY: Preg Test, Ur: NEGATIVE

## 2018-02-06 MED ORDER — ACETAMINOPHEN 500 MG PO TABS
500.0000 mg | ORAL_TABLET | Freq: Once | ORAL | Status: AC
  Administered 2018-02-06: 500 mg via ORAL

## 2018-02-06 MED ORDER — ETONOGESTREL 68 MG ~~LOC~~ IMPL: 1.0000 | DRUG_IMPLANT | Freq: Once | SUBCUTANEOUS | 0 refills | Status: DC

## 2018-02-06 MED ORDER — ETONOGESTREL 68 MG ~~LOC~~ IMPL
68.0000 mg | DRUG_IMPLANT | Freq: Once | SUBCUTANEOUS | Status: AC
Start: 2018-02-06 — End: 2018-02-06
  Administered 2018-02-06: 68 mg via SUBCUTANEOUS

## 2018-02-06 NOTE — Progress Notes (Signed)
     PRE-OP DIAGNOSIS: desired long-term, reversible contraception  POST-OP DIAGNOSIS: Same  PROCEDURE: Nexplanon  placement Performing Physician: Anders Simmondshristina Kennya Schwenn  Supervising Physician (if applicable): N/A   PROCEDURE:  Site (check):  [X]      Left Arm       Sterile Preparation:    [X]       Betadine     Insertion site was selected 8-10 cm from medial epicondyle and marked along with guiding site using sterile marker Procedure area was prepped and draped in a sterile fashion. 2 mL of 1% lidocaine with epinephrine used for subcutaneous anesthesia. Anesthesia confirmed.  Nexplanon  trocar was inserted subcutaneously and then Nexplanon  capsule delivered subcutaneously Trocar was removed from the insertion site. Nexplanon  capsule was palpated by provider and patient to assure satisfactory placement. Estimated blood loss of <1  mL Dressings applied:     X   Adhesive Dressing     X  Gauze/Tape    Followup: The patient tolerated the procedure well without complications.  Standard post-procedure care is explained and return precautions are given.  Anders Simmondshristina Theadore Blunck, MD [facility], PGY-3

## 2018-02-06 NOTE — Patient Instructions (Signed)
Nexplanon Instructions After Insertion  Keep bandage clean and dry for 24 hours  May use ice/Tylenol/Ibuprofen for soreness or pain  If you develop fever, drainage or increased warmth from incision site-contact office immediately   

## 2018-02-07 ENCOUNTER — Telehealth: Payer: Self-pay | Admitting: Family Medicine

## 2018-02-07 NOTE — Telephone Encounter (Signed)
Called patient with [facility] [employer] regarding pain after Nexplanon insertion.  She is complaining of arm pain, chest pressure, and shortness of breath.   I told her that she is very young to have a heart attack, so that would be extremely unlikely.  I told the patient to try tylenol and ibuprofen and if her symptoms worsen, to go to urgent care or the ED overnight or make an appointment in our clinic tomorrow.  Patient expressed understanding.

## 2018-02-07 NOTE — Telephone Encounter (Signed)
Pt started birth control yesterday and is now having pain and does not feel well. Pt would like to be contacted by Dr Frances FurbishWinfrey at [phone]. Pt didn't speak a lot of english which made it hard to understand everything she was saying.

## 2018-04-05 ENCOUNTER — Encounter: Payer: Self-pay | Admitting: Family Medicine

## 2018-04-05 ENCOUNTER — Other Ambulatory Visit: Payer: Self-pay

## 2018-04-05 ENCOUNTER — Ambulatory Visit (INDEPENDENT_AMBULATORY_CARE_PROVIDER_SITE_OTHER): Payer: Self-pay | Admitting: Family Medicine

## 2018-04-05 VITALS — BP 134/86 | HR 82 | Temp 98.7°F | Ht 61.0 in | Wt 241.4 lb

## 2018-04-05 DIAGNOSIS — M545 Low back pain, unspecified: Secondary | ICD-10-CM

## 2018-04-05 DIAGNOSIS — N814 Uterovaginal prolapse, unspecified: Secondary | ICD-10-CM

## 2018-04-05 MED ORDER — IBUPROFEN 200 MG PO TABS: 600.0000 mg | ORAL_TABLET | Freq: Four times a day (QID) | ORAL | 4 refills | Status: DC | PRN

## 2018-04-05 MED ORDER — CYCLOBENZAPRINE HCL 10 MG PO TABS: 10.0000 mg | ORAL_TABLET | Freq: Three times a day (TID) | ORAL | 0 refills | Status: DC | PRN

## 2018-04-05 NOTE — Progress Notes (Signed)
Subjective:    Deborah Chandler - 37 y.o. female MRN [id number]  Date of birth: 05-24-1981  HPI  Deborah Chandler is here for back pain and possible pelvic floor prolapse.  She also says that she has been spotting almost every day after getting the Nexplanon placed.  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  She thinks that this may be exacerbating her pain, which occurs in her bilateral lower back and hips.  Her pain is worse at the end of the day, and she believes that it is coming from her muscles.  She denies numbness or tingling.  Possible cystocele or uterocele Patient says that she sometimes feels a bulge in her vagina, and that this is been going on for many years.  She sometimes has bowel incontinence.  She does not have urinary incontinence.  She says [place] that she may need surgery for this issue.  She has had 2 vaginal births.  She also endorses some pain during sexual intercourse.  Menstrual spotting Patient notes that she has had some minor spotting that she notices when wiping after going to the bathroom.  She says that this is coming from her vagina.  She does not have to wear a panty liner or a pad.  Some days she does not bleed and others she has a small amount of spotting.    Health Maintenance:  Health Maintenance Due  Topic Date Due  . INFLUENZA VACCINE  04/06/2018    -  reports that she has never smoked. She has never used smokeless tobacco. - Review of Systems: Per HPI. - Past Medical History: Patient Active Problem List   Diagnosis Date Noted  . Uterine prolapse 04/06/2018  . Acute bilateral low back pain without sciatica 04/06/2018  . Cough   . Hypoxia   . Pneumonia 02/27/2017  . Community acquired pneumonia of left lung   . Diarrhea 12/21/2016  . Spider veins of both lower extremities 08/17/2016  . Abdominal pain, chronic, epigastric 06/16/2016  . Dyspepsia  06/16/2016  . Vaginal discharge 05/28/2016  . Abdominal pain in female patient 05/03/2016  . Nausea and vomiting in adult patient 03/16/2016  . GERD (gastroesophageal reflux disease) 02/04/2016  . Candidal intertrigo 02/04/2016  . Dysmenorrhea 03/20/2015  . Menorrhagia 03/20/2015   - Medications: reviewed and updated   Objective:   Physical Exam BP 134/86   Pulse 82   Temp 98.7 F (37.1 C) (Oral)   Ht 5\' 1"  (1.549 m)   Wt 241 lb 6.4 oz (109.5 kg)   LMP 03/13/2018 (Approximate)   SpO2 98%   BMI 45.61 kg/m  Gen: NAD, alert, cooperative with exam, well-appearing, obese CV: RRR, good S1/S2, no murmur, no edema Resp: CTABL, no wheezes, non-labored Abd: SNTND, BS present, no guarding or organomegaly GU: No cystocele or uterocele visualized, but uterus palpated a few centimeters from introitus.  Small bulge also palpated in anterior wall of vagina, possibly patient's bladder.  Pelvic organs palpated without Valsalva, but were more apparent with Valsalva. Musculoskeletal: full ROM of lumbar spine, no step offs palpated, mild tenderness to palpation in lumbar paraspinal muscles Neuro: no gross deficits.  Psych: good insight, alert and oriented     Assessment & Plan:   Uterine prolapse Will refer patient to GYN for further management.  Acute bilateral low back pain without sciatica Prescribe Tylenol, Motrin, and Flexeril.  Advised patient to try Motrin and  Tylenol before using Flexeril and to only take these medications as needed, up to 3 times per day.    Lezlie Octave, M.D. 04/06/2018, 10:50 AM PGY-2, [facility]

## 2018-04-06 DIAGNOSIS — N814 Uterovaginal prolapse, unspecified: Secondary | ICD-10-CM | POA: Insufficient documentation

## 2018-04-06 DIAGNOSIS — M546 Pain in thoracic spine: Secondary | ICD-10-CM | POA: Insufficient documentation

## 2018-04-06 DIAGNOSIS — M545 Low back pain: Secondary | ICD-10-CM | POA: Insufficient documentation

## 2018-04-06 NOTE — Assessment & Plan Note (Signed)
Will refer patient to GYN for further management.

## 2018-04-06 NOTE — Assessment & Plan Note (Signed)
Prescribe Tylenol, Motrin, and Flexeril.  Advised patient to try Motrin and Tylenol before using Flexeril and to only take these medications as needed, up to 3 times per day.

## 2018-04-10 ENCOUNTER — Telehealth: Payer: Self-pay | Admitting: *Deleted

## 2018-04-10 NOTE — Telephone Encounter (Signed)
Patient's daughter calling because patient wants to speak with Dr. Frances FurbishWinfrey about still having menstrual bleeding.  Had office visit last week.  Also, has been bleeding since getting Nexplanon inserted.  Will route request to PCP.  Patient speaks Spanish and will need [employer] to place 3rd party call [phone]).  Altamese Dilling~Jeannette Richardson, BSN, RN-BC

## 2018-04-11 NOTE — Telephone Encounter (Signed)
Call completed with the help of [employer] [phone].    Discussed Deborah Chandler' continued menstrual bleeding after placement of the Nexplanon.  She says that she has had a period for the past three weeks.  I told her that many women experience menstrual irregularities such as these during the first 3-6 months after Nexplanon placement.  I advised that we can take this out at any time, but that it may be beneficial to wait 3-6 months to give her periods a chance to regulate before removing it, since she may experience less bleeding after these first few months.  She was in agreement with this plan.

## 2018-04-14 ENCOUNTER — Telehealth: Payer: Self-pay | Admitting: Family Medicine

## 2018-04-14 ENCOUNTER — Telehealth: Payer: Self-pay | Admitting: *Deleted

## 2018-04-14 NOTE — Telephone Encounter (Signed)
Entered in error. Zimmerman Rumple, April D, CMA  

## 2018-04-14 NOTE — Telephone Encounter (Signed)
Attempted to call back, no answer and no machine.  Will await callback .Fleeger, Maryjo RochesterJessica Dawn, CMA

## 2018-04-14 NOTE — Telephone Encounter (Signed)
Contacted pt via [employer] [phone] and scheduled her an appointment on Monday with Dr. Mosetta PuttFeng due to PCP not being available next week. Instructed her to go to ER or UR if she feels like she needs to be seen earlier. Lamonte SakaiZimmerman Rumple, April D, [place]

## 2018-04-14 NOTE — Telephone Encounter (Signed)
Deborah Chandler, pts daughter, says pt  wants to talk to a nurse about her bleeding.  She wants to be called back ASAP

## 2018-04-17 ENCOUNTER — Other Ambulatory Visit: Payer: Self-pay

## 2018-04-17 ENCOUNTER — Encounter: Payer: Self-pay | Admitting: Student in an Organized Health Care Education/Training Program

## 2018-04-17 ENCOUNTER — Ambulatory Visit (INDEPENDENT_AMBULATORY_CARE_PROVIDER_SITE_OTHER): Payer: Self-pay | Admitting: Student in an Organized Health Care Education/Training Program

## 2018-04-17 VITALS — BP 108/78 | HR 107 | Temp 98.8°F | Ht 61.0 in | Wt 239.8 lb

## 2018-04-17 DIAGNOSIS — N939 Abnormal uterine and vaginal bleeding, unspecified: Secondary | ICD-10-CM

## 2018-04-17 MED ORDER — NORGESTIMATE-ETH ESTRADIOL 0.25-35 MG-MCG PO TABS: 1.0000 | ORAL_TABLET | Freq: Every day | ORAL | 0 refills | Status: DC

## 2018-04-17 NOTE — Patient Instructions (Signed)
It was a pleasure seeing you today in our clinic.   Our clinic's number is [phone]. Please call with questions or concerns about what we discussed today.  Be well, Dr. Florabel Faulks   

## 2018-04-17 NOTE — Progress Notes (Signed)
   CC: dysmenorrhea, AUB  HPI: Deborah Chandler is a 37 y.o. female with PMH significant for GERD who [facility] with vaginal bleeding of one months duration.   Patient reports that she got the nexplanon placed in order to help control her heavy menstruation. However, she reports today that she has been bleeding daily for the past month. She has occasionally passed clots. She has some lower abdominal cramping. She denies palpitations or dyspnea, has not felt dizzy or light headed.  She has not had any fevers, burning with urination, diarrhea or constipation.  Spanish language interpreter on the ipad was used.  Review of Symptoms:  See HPI for ROS.   CC, SH/smoking status, and VS noted.  Objective: BP 108/78   Pulse (!) 107   Temp 98.8 F (37.1 C) (Oral)   Ht 5\' 1"  (1.549 m)   Wt 239 lb 12.8 oz (108.8 kg)   SpO2 97%   BMI 45.31 kg/m  GEN: NAD, alert, cooperative, and pleasant. RESPIRATORY: clear to auscultation bilaterally with no wheezes, rhonchi or rales, good effort CV: RRR, no m/r/g, no peripheral edema GI: soft, non-tender, non-distended, no hepatosplenomegaly SKIN: warm and dry, no rashes or lesions  Assessment and plan:  Abnormal Uterine Bleeding Will trial OCP to help control bleeding. Plan for OCP x 3 months and then trial off of this medication. If she fails OCP's we could do a trial of NSAIDs. No red flags for anemia. Patient expresses understanding and is in agreement with this plan.   Howard PouchLauren Nikcole Eischeid, MD,MS,  PGY3 04/17/2018 4:54 PM

## 2018-04-21 ENCOUNTER — Telehealth: Payer: Self-pay | Admitting: Family Medicine

## 2018-04-21 NOTE — Telephone Encounter (Signed)
Pt would like Dr. Mosetta PuttFeng to call her concerning her last appointment. She says she has been wanting to get her results and has not heard anything.

## 2018-04-23 NOTE — Telephone Encounter (Signed)
I did not order any tests at her last visit.

## 2018-04-24 NOTE — Telephone Encounter (Signed)
Contacted pt using [employer]  Diamond NickelBeatrice [phone] told pt that there was never any mention of an actual test. I did explain to her that she was to continue to take the birth control pill for 3 months and then she would stop to see if the bleeding came back.  Pt daughter then got on the phone and I explained it to her as well and she understood and said that she would communicate this to her mom.  She said that there was some confusion in that patient thought an actual test would be performed. Lamonte SakaiZimmerman Rumple, April D, [place]

## 2018-04-24 NOTE — Telephone Encounter (Signed)
PT daughter called again wanting to speak with Dr. Mosetta PuttFeng. I informed her that there was no test scheduled and she got very upset. She said stated they were told "there was going to be some kind of testing in two weeks and they will call to schedule". I asked if she knew what kind of testing and she insisted on the doctor calling. She also wanted Dr. Mosetta PuttFeng to know her mother will need a translator.

## 2018-05-12 ENCOUNTER — Telehealth: Payer: Self-pay | Admitting: Family Medicine

## 2018-05-12 NOTE — Telephone Encounter (Signed)
Asked Dr. Manson Passey about this and she said that she could have alcohol with her birth control. Lamonte Sakai, Genette Huertas D, [place]

## 2018-05-12 NOTE — Telephone Encounter (Signed)
Patient needs to know if she is able to have alcohol after or during using Sprente?

## 2018-05-12 NOTE — Telephone Encounter (Signed)
Patient needs to know the answer today, if possible, thank you!

## 2018-06-13 ENCOUNTER — Encounter: Payer: Medicaid Other | Admitting: Obstetrics & Gynecology

## 2018-06-13 ENCOUNTER — Encounter: Payer: Self-pay | Admitting: Obstetrics & Gynecology

## 2018-06-23 ENCOUNTER — Telehealth: Payer: Self-pay | Admitting: Family Medicine

## 2018-06-23 NOTE — Telephone Encounter (Signed)
Pt daughter called and wanted to make an appointment for her mother. At first she would not tell me why pt needed to be seen  and then said she would not see anyone but Dr. Frances Furbish. When I told her Dr. Frances Furbish did not have anything until December she said she wasn't waiting three months and asked for Dr. Frances Furbish to call her mother at [phone] after 4:30.

## 2018-06-26 NOTE — Telephone Encounter (Signed)
Please let patient know that it would be better for me to assess her in person, so I found an appointment for her on 11/4 at 2:20PM.

## 2018-06-27 NOTE — Telephone Encounter (Signed)
LVM to call office back to inform her of the appointment that Dr. Frances Furbish was able to find for her. Deborah Chandler, Deborah Chandler, [place]

## 2018-06-30 NOTE — Telephone Encounter (Signed)
PT informed of appointment by her daughter. Lamonte Sakai, Takoda Siedlecki D, [place]

## 2018-07-10 ENCOUNTER — Other Ambulatory Visit: Payer: Self-pay

## 2018-07-10 ENCOUNTER — Other Ambulatory Visit (HOSPITAL_COMMUNITY)
Admission: RE | Admit: 2018-07-10 | Discharge: 2018-07-10 | Disposition: A | Discharge status: Home / Self Care | Payer: Medicaid Other | Source: Ambulatory Visit | Attending: Family Medicine | Admitting: Family Medicine

## 2018-07-10 ENCOUNTER — Encounter: Payer: Self-pay | Admitting: Family Medicine

## 2018-07-10 ENCOUNTER — Ambulatory Visit (INDEPENDENT_AMBULATORY_CARE_PROVIDER_SITE_OTHER): Payer: Self-pay | Admitting: Family Medicine

## 2018-07-10 VITALS — BP 138/88 | HR 82 | Temp 98.6°F | Ht 61.0 in | Wt 240.6 lb

## 2018-07-10 DIAGNOSIS — R399 Unspecified symptoms and signs involving the genitourinary system: Secondary | ICD-10-CM | POA: Insufficient documentation

## 2018-07-10 DIAGNOSIS — N920 Excessive and frequent menstruation with regular cycle: Secondary | ICD-10-CM

## 2018-07-10 DIAGNOSIS — N898 Other specified noninflammatory disorders of vagina: Secondary | ICD-10-CM | POA: Insufficient documentation

## 2018-07-10 DIAGNOSIS — R1013 Epigastric pain: Secondary | ICD-10-CM

## 2018-07-10 DIAGNOSIS — Z113 Encounter for screening for infections with a predominantly sexual mode of transmission: Secondary | ICD-10-CM

## 2018-07-10 DIAGNOSIS — Z23 Encounter for immunization: Secondary | ICD-10-CM

## 2018-07-10 LAB — POCT WET PREP (WET MOUNT)
CLUE CELLS WET PREP WHIFF POC: NEGATIVE
TRICHOMONAS WET PREP HPF POC: ABSENT

## 2018-07-10 LAB — POCT URINALYSIS DIP (MANUAL ENTRY)
Bilirubin, UA: NEGATIVE
Glucose, UA: NEGATIVE mg/dL
Ketones, POC UA: NEGATIVE mg/dL
Leukocytes, UA: NEGATIVE
NITRITE UA: NEGATIVE
PH UA: 6 (ref 5.0–8.0)
Urobilinogen, UA: 0.2 E.U./dL

## 2018-07-10 MED ORDER — LEVONORGEST-ETH ESTRAD 91-DAY 0.15-0.03 MG PO TABS: 1.0000 | ORAL_TABLET | Freq: Every day | ORAL | 4 refills | Status: DC

## 2018-07-10 MED ORDER — FAMOTIDINE 20 MG PO TABS: 20.0000 mg | ORAL_TABLET | Freq: Two times a day (BID) | ORAL | 11 refills | Status: DC

## 2018-07-10 NOTE — Patient Instructions (Signed)
Mucho gusto verla hoy!    Estoy enviando una medicina para reducir la frequencia de sus periodos.  Estoy enviando medicina para su acido de estomago tambien.  Voy a llamarla si sus resultados no son normales hoy.  Buena suerte,  Dr. Frances Furbish

## 2018-07-10 NOTE — Assessment & Plan Note (Signed)
Will perform wet prep and GC/chlamydia testing today.

## 2018-07-10 NOTE — Assessment & Plan Note (Addendum)
Will perform UA with reflex microscopy today.  Patient is currently on her menstrual period, so we will ignore the hemoglobin on UA and RBCs found on microscopy.

## 2018-07-10 NOTE — Assessment & Plan Note (Signed)
Patient is interested in having her period less frequently since it causes problems for her, so we will prescribe OCPs that allow for 4 periods per year.

## 2018-07-10 NOTE — Assessment & Plan Note (Signed)
Sent in a prescription for Pepcid and also advised patient that Tums can be helpful for her likely acid reflux.

## 2018-07-10 NOTE — Progress Notes (Signed)
   Subjective:    Deborah Chandler - 37 y.o. female MRN [id number]  Date of birth: 06-29-1981  CC:   Deborah Chandler is here for vaginal odor, urinary frequency, stomach pain, menorrhagia.  HPI  Vaginal odor -Says that her vaginal odor is worst at the end of each.,  But it does continue between periods -Has not used any new soaps or detergents -Says that she has had multiple sexual partners in the recent past, and she thinks it is possible that she has an infection  Urinary frequency -This is been going on for a couple weeks -Has had some burning on urination and increased urgency -No constitutional symptoms  Stomach pain -Has a burning pain in the epigastric area that will sometimes radiate up to her chest -She will have an acidic taste in her mouth frequently as well  Menorrhagia -Her periods last for 6 days and are better than they were now that she is on OCPs and Nexplanon -Would like for her periods to be less frequent and they are now -They often make her back pain worse Health Maintenance:  Health Maintenance Due  Topic Date Due  . INFLUENZA VACCINE  04/06/2018    -  reports that she has never smoked. She has never used smokeless tobacco. - Review of Systems: Per HPI. - Past Medical History: Patient Active Problem List   Diagnosis Date Noted  . Vaginal odor 07/10/2018  . UTI symptoms 07/10/2018  . Uterine prolapse 04/06/2018  . Acute bilateral low back pain without sciatica 04/06/2018  . Cough   . Hypoxia   . Pneumonia 02/27/2017  . Community acquired pneumonia of left lung   . Diarrhea 12/21/2016  . Spider veins of both lower extremities 08/17/2016  . Abdominal pain, chronic, epigastric 06/16/2016  . Dyspepsia 06/16/2016  . Vaginal discharge 05/28/2016  . Abdominal pain in female patient 05/03/2016  . Nausea and vomiting in adult patient 03/16/2016  . GERD (gastroesophageal reflux disease) 02/04/2016  . Candidal intertrigo 02/04/2016  .  Dysmenorrhea 03/20/2015  . Menorrhagia 03/20/2015   - Medications: reviewed and updated   Objective:   Physical Exam BP 138/88   Pulse 82   Temp 98.6 F (37 C) (Oral)   Ht 5\' 1"  (1.549 m)   Wt 240 lb 9.6 oz (109.1 kg)   LMP 07/05/2018 (Exact Date)   SpO2 99%   BMI 45.46 kg/m  Gen: NAD, alert, cooperative with exam CV: RRR, good S1/S2, no murmur, no edema Resp: CTABL, no wheezes, non-labored Abd: SNTND, BS present, no guarding or organomegaly      Assessment & Plan:   Vaginal odor Will perform wet prep and GC/chlamydia testing today.  UTI symptoms Will perform UA with reflex microscopy today.  Patient is currently on her menstrual period, so we will ignore the hemoglobin on UA and RBCs found on microscopy.  Dyspepsia Sent in a prescription for Pepcid and also advised patient that Tums can be helpful for her likely acid reflux.  Menorrhagia Patient is interested in having her period less frequently since it causes problems for her, so we will prescribe OCPs that allow for 4 periods per year.    Lezlie Octave, M.D. 07/10/2018, 3:53 PM PGY-2, [facility]

## 2018-07-11 ENCOUNTER — Telehealth: Payer: Self-pay | Admitting: Family Medicine

## 2018-07-11 LAB — URINE CYTOLOGY ANCILLARY ONLY
Chlamydia: NEGATIVE
NEISSERIA GONORRHEA: NEGATIVE

## 2018-07-11 NOTE — Telephone Encounter (Signed)
Pt daughter is calling and would like for Dr. Frances Furbish to place her gynecology referral again. Pt missed her appointment.

## 2018-07-12 ENCOUNTER — Other Ambulatory Visit: Payer: Self-pay | Admitting: Family Medicine

## 2018-07-12 DIAGNOSIS — N814 Uterovaginal prolapse, unspecified: Secondary | ICD-10-CM

## 2018-07-12 NOTE — Telephone Encounter (Signed)
I've placed the referral.  Thanks! 

## 2018-07-13 ENCOUNTER — Telehealth: Payer: Self-pay | Admitting: *Deleted

## 2018-07-13 NOTE — Telephone Encounter (Signed)
Pt here is office today with daughter and she would like test results from recent visit. Routing to PCP. Lamonte Sakai, April D, [place]

## 2018-07-14 NOTE — Telephone Encounter (Signed)
All of her test results were normal from her previous visit.  This means she does not have an infection that needs to be treated.  Thanks.

## 2018-07-14 NOTE — Telephone Encounter (Signed)
Daughter and mom were in office yesterday and daughter was told that referral had been placed. Lamonte Sakai, April D, [place]

## 2018-07-15 ENCOUNTER — Emergency Department (HOSPITAL_COMMUNITY)
Admission: EM | Admit: 2018-07-15 | Discharge: 2018-07-15 | Disposition: A | Discharge status: Home / Self Care | Payer: Medicaid Other | Attending: Emergency Medicine | Admitting: Emergency Medicine

## 2018-07-15 ENCOUNTER — Emergency Department (HOSPITAL_COMMUNITY): Payer: Medicaid Other

## 2018-07-15 ENCOUNTER — Other Ambulatory Visit: Payer: Self-pay

## 2018-07-15 ENCOUNTER — Encounter (HOSPITAL_COMMUNITY): Payer: Self-pay

## 2018-07-15 DIAGNOSIS — Z79899 Other long term (current) drug therapy: Secondary | ICD-10-CM | POA: Insufficient documentation

## 2018-07-15 DIAGNOSIS — Y939 Activity, unspecified: Secondary | ICD-10-CM | POA: Insufficient documentation

## 2018-07-15 DIAGNOSIS — S43401A Unspecified sprain of right shoulder joint, initial encounter: Secondary | ICD-10-CM | POA: Insufficient documentation

## 2018-07-15 DIAGNOSIS — S63632A Sprain of interphalangeal joint of right middle finger, initial encounter: Secondary | ICD-10-CM

## 2018-07-15 DIAGNOSIS — W230XXA Caught, crushed, jammed, or pinched between moving objects, initial encounter: Secondary | ICD-10-CM | POA: Insufficient documentation

## 2018-07-15 DIAGNOSIS — Y929 Unspecified place or not applicable: Secondary | ICD-10-CM | POA: Insufficient documentation

## 2018-07-15 DIAGNOSIS — Y999 Unspecified external cause status: Secondary | ICD-10-CM | POA: Insufficient documentation

## 2018-07-15 MED ORDER — IBUPROFEN 800 MG PO TABS
800.0000 mg | ORAL_TABLET | Freq: Once | ORAL | Status: AC
  Administered 2018-07-15: 800 mg via ORAL
  Filled 2018-07-15: qty 1

## 2018-07-15 MED ORDER — IBUPROFEN 600 MG PO TABS: 600.0000 mg | ORAL_TABLET | Freq: Four times a day (QID) | ORAL | 0 refills | Status: DC | PRN
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
## 2018-07-15 NOTE — Discharge Instructions (Signed)
Return if any problems.  See the Hand Surgeon for evaluation if pain persist

## 2018-07-15 NOTE — ED Notes (Signed)
Patient transported to X-ray 

## 2018-07-15 NOTE — ED Notes (Signed)
Patient verbalizes understanding of discharge instructions. Opportunity for questioning and answers were provided. Armband removed by staff, pt discharged from ED.  

## 2018-07-17 NOTE — Telephone Encounter (Addendum)
LVM to call office back to inform pt of below. If she calls back please inform her. [employer] [phone]. Lamonte Sakai, April D, [place]

## 2018-07-19 ENCOUNTER — Encounter: Payer: Self-pay | Admitting: Family Medicine

## 2018-07-19 ENCOUNTER — Ambulatory Visit (INDEPENDENT_AMBULATORY_CARE_PROVIDER_SITE_OTHER): Payer: Self-pay | Admitting: Family Medicine

## 2018-07-19 VITALS — BP 130/84 | HR 84 | Temp 98.5°F | Ht 61.0 in | Wt 245.0 lb

## 2018-07-19 DIAGNOSIS — S6991XD Unspecified injury of right wrist, hand and finger(s), subsequent encounter: Secondary | ICD-10-CM

## 2018-07-19 DIAGNOSIS — S6990XA Unspecified injury of unspecified wrist, hand and finger(s), initial encounter: Secondary | ICD-10-CM

## 2018-07-19 HISTORY — DX: Unspecified injury of right wrist, hand and finger(s), subsequent encounter: S69.91XD
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
## 2018-07-19 NOTE — Telephone Encounter (Signed)
Pt informed at office visit today. Sunday SpillersSharon T Julane Crock, CMA

## 2018-07-19 NOTE — Assessment & Plan Note (Addendum)
I have concern for flexor tendon rupture.  Patient went was unable to be seen by an orthopedist due to insurance issues.  I will refer to sports medicine for further evaluation and appropriate treatment. -Ibuprofen 600 mg every 8 hours as needed for pain -Referral to sports medicine -Follow-up in clinic in 2 weeks to ensure good healing of upper arm and shoulder

## 2018-07-19 NOTE — Patient Instructions (Signed)
Gracias por venir hoy.  Yo puse un referral para una cita con un doctor de medicina de deportes (Sports medicine doctor). Debe recibir Management consultantun llamada en la semana que entra.    Regresa en 2 o 3 semanas para segurar que el hombro y brazo estan sanando bien.  Sigue tomando ibuprofena para Chief Technology Officerel dolor.

## 2018-07-28 ENCOUNTER — Ambulatory Visit: Payer: Self-pay

## 2018-07-28 ENCOUNTER — Ambulatory Visit (INDEPENDENT_AMBULATORY_CARE_PROVIDER_SITE_OTHER): Payer: Self-pay | Admitting: Family Medicine

## 2018-07-28 VITALS — BP 132/98 | Ht 59.45 in | Wt 210.0 lb

## 2018-07-28 DIAGNOSIS — S6990XA Unspecified injury of unspecified wrist, hand and finger(s), initial encounter: Secondary | ICD-10-CM
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
## 2018-07-28 NOTE — Progress Notes (Signed)
[facility]: Attending Note: I have reviewed the chart, discussed wit the Sports Medicine Fellow. I agree with assessment and treatment plan as detailed in the Fellow's note. I examined the patient and reviewed the ultrasound while she was in the office.  I agree there is a potential partial volar plate disruption.  Agree with further imaging.

## 2018-07-28 NOTE — Progress Notes (Signed)
Video preceptor was Marcelino DusterMichelle [phone]

## 2018-07-31 ENCOUNTER — Ambulatory Visit (INDEPENDENT_AMBULATORY_CARE_PROVIDER_SITE_OTHER): Payer: Self-pay | Admitting: Family Medicine

## 2018-07-31 ENCOUNTER — Other Ambulatory Visit: Payer: Self-pay

## 2018-07-31 ENCOUNTER — Encounter: Payer: Self-pay | Admitting: Family Medicine

## 2018-07-31 VITALS — BP 130/96 | HR 83 | Temp 98.9°F | Ht 61.0 in | Wt 243.4 lb

## 2018-07-31 DIAGNOSIS — R1013 Epigastric pain: Secondary | ICD-10-CM

## 2018-07-31 DIAGNOSIS — M25511 Pain in right shoulder: Secondary | ICD-10-CM

## 2018-07-31 DIAGNOSIS — S6991XD Unspecified injury of right wrist, hand and finger(s), subsequent encounter: Secondary | ICD-10-CM

## 2018-07-31 HISTORY — DX: Pain in right shoulder: M25.511

## 2018-07-31 MED ORDER — FAMOTIDINE 20 MG PO TABS: 20.0000 mg | ORAL_TABLET | Freq: Two times a day (BID) | ORAL | 3 refills | Status: DC

## 2018-07-31 NOTE — Assessment & Plan Note (Signed)
Resolved.  Normal shoulder exam.  Patient without complaint. -No follow-up required

## 2018-07-31 NOTE — Assessment & Plan Note (Signed)
Refilled famotidine 

## 2018-07-31 NOTE — Assessment & Plan Note (Signed)
Advised patient that she should have MRI and follow-up with sports medicine as scheduled, as there is concern for volar plate rupture.  No changes to the plan at this point.  Continue to encourage that she use volar splint at all times.  Ibuprofen as needed for pain.

## 2018-07-31 NOTE — Addendum Note (Signed)
Addended by: Annita BrodMOORE, Yessika Otte C on: 07/31/2018 08:47 AM   Modules accepted: Orders

## 2018-07-31 NOTE — Patient Instructions (Addendum)
I'm glad that your right arm is feeling better.  It looked good on my exam.  Continue to keep your finger wrapped with the splint.  You will need to get your MRI done and follow up as scheduled with the Sports Medicine doctor.  I have refilled your Famotidine prescription.  It was great to meet you!  Best, Dr. Fredric MareBailey
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
## 2018-08-11 ENCOUNTER — Ambulatory Visit
Admission: RE | Admit: 2018-08-11 | Discharge: 2018-08-11 | Disposition: A | Discharge status: Home / Self Care | Payer: Medicaid Other | Source: Ambulatory Visit | Attending: Family Medicine | Admitting: Family Medicine

## 2018-08-11 ENCOUNTER — Encounter: Payer: Self-pay | Admitting: Family Medicine

## 2018-08-11 DIAGNOSIS — S6990XA Unspecified injury of unspecified wrist, hand and finger(s), initial encounter: Secondary | ICD-10-CM

## 2018-08-16 ENCOUNTER — Ambulatory Visit (INDEPENDENT_AMBULATORY_CARE_PROVIDER_SITE_OTHER): Payer: Self-pay | Admitting: Sports Medicine

## 2018-08-16 VITALS — BP 133/92 | Ht 59.0 in | Wt 210.0 lb

## 2018-08-16 DIAGNOSIS — S6991XD Unspecified injury of right wrist, hand and finger(s), subsequent encounter: Secondary | ICD-10-CM

## 2018-08-16 DIAGNOSIS — S6000XD Contusion of unspecified finger without damage to nail, subsequent encounter: Secondary | ICD-10-CM

## 2018-08-16 MED ORDER — TRAMADOL HCL 50 MG PO TABS: 50.0000 mg | ORAL_TABLET | Freq: Two times a day (BID) | ORAL | 0 refills | Status: DC | PRN
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
## 2018-08-21 ENCOUNTER — Encounter (HOSPITAL_COMMUNITY): Payer: Self-pay | Admitting: Emergency Medicine

## 2018-08-21 ENCOUNTER — Other Ambulatory Visit: Payer: Self-pay

## 2018-08-21 ENCOUNTER — Emergency Department (HOSPITAL_COMMUNITY)
Admission: EM | Admit: 2018-08-21 | Discharge: 2018-08-21 | Disposition: A | Discharge status: Home / Self Care | Payer: Self-pay | Attending: Emergency Medicine | Admitting: Emergency Medicine

## 2018-08-21 ENCOUNTER — Encounter: Payer: Medicaid Other | Admitting: Obstetrics & Gynecology

## 2018-08-21 ENCOUNTER — Emergency Department (HOSPITAL_COMMUNITY): Payer: Self-pay

## 2018-08-21 DIAGNOSIS — Z79899 Other long term (current) drug therapy: Secondary | ICD-10-CM | POA: Insufficient documentation

## 2018-08-21 DIAGNOSIS — M7918 Myalgia, other site: Secondary | ICD-10-CM | POA: Insufficient documentation

## 2018-08-21 DIAGNOSIS — R0789 Other chest pain: Secondary | ICD-10-CM | POA: Insufficient documentation

## 2018-08-21 LAB — COMPREHENSIVE METABOLIC PANEL
ALT: 32 U/L (ref 0–44)
AST: 19 U/L (ref 15–41)
Albumin: 3.3 g/dL — ABNORMAL LOW (ref 3.5–5.0)
Alkaline Phosphatase: 61 U/L (ref 38–126)
Anion gap: 10 (ref 5–15)
BUN: 10 mg/dL (ref 6–20)
CO2: 23 mmol/L (ref 22–32)
CREATININE: 0.73 mg/dL (ref 0.44–1.00)
Calcium: 8.9 mg/dL (ref 8.9–10.3)
Chloride: 104 mmol/L (ref 98–111)
GFR calc Af Amer: >60 mL/min (ref >60)
GFR calc non Af Amer: >60 mL/min (ref >60)
Glucose, Bld: 134 mg/dL — ABNORMAL HIGH (ref 70–99)
Potassium: 4.1 mmol/L (ref 3.5–5.1)
Sodium: 137 mmol/L (ref 135–145)
Total Bilirubin: 0.4 mg/dL (ref 0.3–1.2)
Total Protein: 6.9 g/dL (ref 6.5–8.1)

## 2018-08-21 LAB — CBC
HCT: 40 % (ref 36.0–46.0)
Hemoglobin: 11.7 g/dL — ABNORMAL LOW (ref 12.0–15.0)
MCH: 22.2 pg — ABNORMAL LOW (ref 26.0–34.0)
MCHC: 29.3 g/dL — ABNORMAL LOW (ref 30.0–36.0)
MCV: 75.8 fL — ABNORMAL LOW (ref 80.0–100.0)
Platelets: 329 10*3/uL (ref 150–400)
RBC: 5.28 MIL/uL — ABNORMAL HIGH (ref 3.87–5.11)
RDW: 13.9 % (ref 11.5–15.5)
WBC: 11.5 10*3/uL — ABNORMAL HIGH (ref 4.0–10.5)
nRBC: 0 % (ref 0.0–0.2)

## 2018-08-21 LAB — URINALYSIS, ROUTINE W REFLEX MICROSCOPIC
BILIRUBIN URINE: NEGATIVE
Glucose, UA: NEGATIVE mg/dL
Ketones, ur: NEGATIVE mg/dL
Leukocytes, UA: NEGATIVE
Nitrite: NEGATIVE
Protein, ur: NEGATIVE mg/dL
SPECIFIC GRAVITY, URINE: 1.02 (ref 1.005–1.030)
pH: 5 (ref 5.0–8.0)

## 2018-08-21 LAB — I-STAT BETA HCG BLOOD, ED (MC, WL, AP ONLY): I-stat hCG, quantitative: <5 m[IU]/mL (ref <5)

## 2018-08-21 LAB — LIPASE, BLOOD: Lipase: 35 U/L (ref 11–51)

## 2018-08-21 MED ORDER — IBUPROFEN 600 MG PO TABS: 600.0000 mg | ORAL_TABLET | Freq: Four times a day (QID) | ORAL | 0 refills | Status: DC | PRN

## 2018-08-21 MED ORDER — IBUPROFEN 800 MG PO TABS
800.0000 mg | ORAL_TABLET | Freq: Once | ORAL | Status: AC
  Administered 2018-08-21: 800 mg via ORAL
  Filled 2018-08-21: qty 1

## 2018-08-21 MED ORDER — CYCLOBENZAPRINE HCL 10 MG PO TABS: 10.0000 mg | ORAL_TABLET | Freq: Two times a day (BID) | ORAL | 0 refills | Status: DC | PRN

## 2018-08-21 MED ORDER — METHOCARBAMOL 500 MG PO TABS
500.0000 mg | ORAL_TABLET | Freq: Once | ORAL | Status: AC
  Administered 2018-08-21: 500 mg via ORAL
  Filled 2018-08-21: qty 1

## 2018-08-21 NOTE — ED Triage Notes (Signed)
Pt c/o RLQ pain x 1 day. Denies nausea/vomiting/diarrhea.

## 2018-08-21 NOTE — ED Provider Notes (Signed)
[facility]   CSN: [id number] Arrival date & time: 08/21/18  0214     History   Chief Complaint Chief Complaint  Patient presents with  . Abdominal Pain    HPI Deborah Chandler is a [age] y.o. female.  Patient to ED for evaluation of sharp pain in the right lateral chest wall that started last week, resolved and returned yesterday. It is worse when she moves, especially turning while lying down. No SOB, cough, fever, abdominal pain, nausea. The pain does not radiate.   The history is provided by the patient. A language interpreter was used (daughter at bedside.).    Past Medical History:  Diagnosis Date  . Anemia    ?  . Gastritis   . H. pylori infection   . Ovarian cyst     Patient Active Problem List   Diagnosis Date Noted  . Pain in joint of right shoulder 07/31/2018  . Finger injury, right, subsequent encounter 07/19/2018  . Vaginal odor 07/10/2018  . UTI symptoms 07/10/2018  . Uterine prolapse 04/06/2018  . Acute bilateral low back pain without sciatica 04/06/2018  . Cough   . Hypoxia   . Pneumonia 02/27/2017  . Community acquired pneumonia of left lung   . Diarrhea 12/21/2016  . Spider veins of both lower extremities 08/17/2016  . Abdominal pain, chronic, epigastric 06/16/2016  . Dyspepsia 06/16/2016  . Vaginal discharge 05/28/2016  . Abdominal pain in female patient 05/03/2016  . Nausea and vomiting in adult patient 03/16/2016  . GERD (gastroesophageal reflux disease) 02/04/2016  . Candidal intertrigo 02/04/2016  . Dysmenorrhea 03/20/2015  . Menorrhagia 03/20/2015    Past Surgical History:  Procedure Laterality Date  . WISDOM TOOTH EXTRACTION     one     OB History    Gravida  2   Para  2   Term  2   Preterm  0   AB  0   Living  2     SAB  0   TAB  0   Ectopic  0   Multiple  0   Live Births               Home Medications    Prior to Admission medications     Medication Sig Start Date End Date Taking? Authorizing Provider  albuterol (PROVENTIL HFA;VENTOLIN HFA) 108 (90 Base) MCG/ACT inhaler Inhale 1-2 puffs into the lungs every 6 (six) hours as needed for wheezing or shortness of breath.    [provider]  azithromycin (ZITHROMAX) 250 MG tablet Take 1 tablet (250 mg total) by mouth daily. 03/01/17   Tillman Sersiccio, Angela C, DO  cyclobenzaprine (FLEXERIL) 10 MG tablet Take 1 tablet (10 mg total) by mouth 3 (three) times daily as needed for muscle spasms. 04/05/18   Lennox SoldersWinfrey, Amanda C, MD  etonogestrel (NEXPLANON) 68 MG IMPL implant 1 each (68 mg total) by Subdermal route once for 1 dose. 02/06/18 02/06/18  Beaulah DinningGambino, Christina M, MD  famotidine (PEPCID) 20 MG tablet Take 1 tablet (20 mg total) by mouth 2 (two) times daily. 07/31/18   Meccariello, Solmon IceBailey J, DO  ibuprofen (ADVIL,MOTRIN) 600 MG tablet Take 1 tablet (600 mg total) by mouth every 6 (six) hours as needed. 07/15/18   Elson AreasSofia, Leslie K, PA-C  levonorgestrel-ethinyl estradiol (SETLAKIN) 0.15-0.03 MG tablet Take 1 tablet by mouth daily. 07/10/18   Lennox SoldersWinfrey, Amanda C, MD  ondansetron (ZOFRAN ODT) 4 MG disintegrating tablet Take 1 tablet (4 mg  total) by mouth every 8 (eight) hours as needed for nausea or vomiting. 12/21/16   Janne Napoleon, NP  traMADol (ULTRAM) 50 MG tablet Take 1 tablet (50 mg total) by mouth 2 (two) times daily as needed. 08/16/18   Gerarda Gunther, MD    Family History Family History  Problem Relation Age of Onset  . Asthma Mother   . Diabetes Father   . Diabetes Paternal Grandmother   . Diabetes Paternal Aunt        x 2  . Colon cancer Neg Hx     Social History Social History   Tobacco Use  . Smoking status: Never Smoker  . Smokeless tobacco: Never Used  Substance Use Topics  . Alcohol use: No  . Drug use: No     Allergies   Patient has no known allergies.   Review of Systems Review of Systems  Constitutional: Negative for chills and fever.  HENT: Negative.    Respiratory: Negative.  Negative for cough and shortness of breath.   Cardiovascular: Positive for chest pain (Right lateral chest wall).  Gastrointestinal: Negative.  Negative for abdominal pain, nausea and vomiting.  Musculoskeletal: Negative.   Skin: Negative.   Neurological: Negative.      Physical Exam Updated Vital Signs BP 115/82   Pulse 73   Temp 98.6 F (37 C) (Oral)   Resp 16   SpO2 100%   Physical Exam Constitutional:      Appearance: She is well-developed.  HENT:     Head: Normocephalic.  Neck:     Musculoskeletal: Normal range of motion and neck supple.  Cardiovascular:     Rate and Rhythm: Normal rate and regular rhythm.  Pulmonary:     Effort: Pulmonary effort is normal.     Breath sounds: Normal breath sounds. No wheezing, rhonchi or rales.    Chest:     Chest wall: Tenderness present.  Abdominal:     General: Bowel sounds are normal.     Palpations: Abdomen is soft.     Tenderness: There is no abdominal tenderness. There is no guarding or rebound.  Musculoskeletal: Normal range of motion.  Skin:    General: Skin is warm and dry.     Findings: No rash.  Neurological:     Mental Status: She is alert and oriented to person, place, and time.      ED Treatments / Results  Labs (all labs ordered are listed, but only abnormal results are displayed) Labs Reviewed  COMPREHENSIVE METABOLIC PANEL - Abnormal; Notable for the following components:      Result Value   Glucose, Bld 134 (*)    Albumin 3.3 (*)    All other components within normal limits  CBC - Abnormal; Notable for the following components:   WBC 11.5 (*)    RBC 5.28 (*)    Hemoglobin 11.7 (*)    MCV 75.8 (*)    MCH 22.2 (*)    MCHC 29.3 (*)    All other components within normal limits  URINALYSIS, ROUTINE W REFLEX MICROSCOPIC - Abnormal; Notable for the following components:   Hgb urine dipstick SMALL (*)    Bacteria, UA RARE (*)    All other components within normal limits   LIPASE, BLOOD  I-STAT BETA HCG BLOOD, ED ([facility], [facility], [facility] ONLY)    EKG None  Radiology No results found.  Procedures Procedures (including critical care time)  Medications Ordered in ED Medications  methocarbamol (ROBAXIN) tablet 500  mg (has no administration in time range)  ibuprofen (ADVIL,MOTRIN) tablet 800 mg (has no administration in time range)     Initial Impression / Assessment and Plan / ED Course  I have reviewed the triage vital signs and the nursing notes.  Pertinent labs & imaging results that were available during my care of the patient were reviewed by me and considered in my medical decision making (see chart for details).     Patient to ED with right sided pain x 1 day, recurrent from last week. No known injury. No cough, SOB, abdominal pain. It is worse when she moves, present at rest.   The patient has no significant lab abnormalities. CXR is clear. The pain is reproducible suggesting musculoskeletal pain. Robaxin and ibuprofen provided with improvement. Normal vital signs without tachycardia, fever, tachypnea or hypoxia.   Final Clinical Impressions(s) / ED Diagnoses   Final diagnoses:  None   1. Musculoskeletal pain  ED Discharge Orders    None       Elpidio Anis, PA-C 08/21/18 [id number]    Glynn Octave, MD 08/21/18 [phone]

## 2018-08-21 NOTE — Discharge Instructions (Signed)
Heat to the sore area. Take medications as prescribed. Follow up with your doctor for recheck if pain continues.

## 2018-09-12 ENCOUNTER — Other Ambulatory Visit: Payer: Self-pay

## 2018-09-12 ENCOUNTER — Ambulatory Visit (INDEPENDENT_AMBULATORY_CARE_PROVIDER_SITE_OTHER): Payer: Self-pay | Admitting: Family Medicine

## 2018-09-12 ENCOUNTER — Encounter: Payer: Self-pay | Admitting: Family Medicine

## 2018-09-12 VITALS — BP 142/102 | HR 77 | Temp 98.4°F | Ht 61.0 in | Wt 242.4 lb

## 2018-09-12 DIAGNOSIS — R059 Cough, unspecified: Secondary | ICD-10-CM

## 2018-09-12 DIAGNOSIS — R05 Cough: Secondary | ICD-10-CM

## 2018-09-12 DIAGNOSIS — J069 Acute upper respiratory infection, unspecified: Secondary | ICD-10-CM

## 2018-09-12 DIAGNOSIS — B9789 Other viral agents as the cause of diseases classified elsewhere: Secondary | ICD-10-CM

## 2018-09-12 DIAGNOSIS — R197 Diarrhea, unspecified: Secondary | ICD-10-CM

## 2018-09-12 MED ORDER — IBUPROFEN 600 MG PO TABS: 600.0000 mg | ORAL_TABLET | Freq: Four times a day (QID) | ORAL | 0 refills | Status: DC | PRN

## 2018-09-12 MED ORDER — BENZONATATE 100 MG PO CAPS: 100.0000 mg | ORAL_CAPSULE | Freq: Two times a day (BID) | ORAL | 0 refills | Status: DC | PRN

## 2018-09-12 NOTE — Patient Instructions (Signed)
It was good to see you today.  Use the loperamide/Imodium for your diarrhea.   Take the Tessalon perles twice daily for your cough.  Continue the Ibuprofen.  I have prescribed this for you.  You can also use Day-quil to help with feeling bad during the day.

## 2018-09-12 NOTE — Progress Notes (Signed)
Subjective:    Deborah Chandler is a 38 y.o. female who presents to [facility] today for cough:  1.  Cough: And subjective fever.  She also has had diarrhea.  Symptoms present for the past 3 to 4 days.  No nausea vomiting.  She is eating and drinking well.  Neuro upper respiratory issues other than some mild congestion.  No sore throat.  Cough is nonproductive.  Not worse in a certain time of day.  She has not tried anything for relief.  She continues to occasionally have headaches roughly 1/week and this has not worsened during her illness.  She has a child at home with similar symptoms.   ROS as above per HPI.  No chest pain.   The following portions of the patient's history were reviewed and updated as appropriate: allergies, current medications, past medical history, family and social history, and problem list. Patient is a nonsmoker.    PMH reviewed.  Past Medical History:  Diagnosis Date  . Anemia    ?  . Gastritis   . H. pylori infection   . Ovarian cyst    Past Surgical History:  Procedure Laterality Date  . WISDOM TOOTH EXTRACTION     one    Medications reviewed. Current Outpatient Medications  Medication Sig Dispense Refill  . albuterol (PROVENTIL HFA;VENTOLIN HFA) 108 (90 Base) MCG/ACT inhaler Inhale 1-2 puffs into the lungs every 6 (six) hours as needed for wheezing or shortness of breath.    Marland Kitchen azithromycin (ZITHROMAX) 250 MG tablet Take 1 tablet (250 mg total) by mouth daily. 3 tablet 0  . cyclobenzaprine (FLEXERIL) 10 MG tablet Take 1 tablet (10 mg total) by mouth 2 (two) times daily as needed for muscle spasms. 20 tablet 0  . etonogestrel (NEXPLANON) 68 MG IMPL implant 1 each (68 mg total) by Subdermal route once for 1 dose. 1 each 0  . famotidine (PEPCID) 20 MG tablet Take 1 tablet (20 mg total) by mouth 2 (two) times daily. 60 tablet 3  . ibuprofen (ADVIL,MOTRIN) 600 MG tablet Take 1 tablet (600 mg total) by mouth every 6 (six) hours as needed. 30 tablet 0  .  levonorgestrel-ethinyl estradiol (SETLAKIN) 0.15-0.03 MG tablet Take 1 tablet by mouth daily. 1 Package 4  . ondansetron (ZOFRAN ODT) 4 MG disintegrating tablet Take 1 tablet (4 mg total) by mouth every 8 (eight) hours as needed for nausea or vomiting. 20 tablet 0  . traMADol (ULTRAM) 50 MG tablet Take 1 tablet (50 mg total) by mouth 2 (two) times daily as needed. 10 tablet 0   No current facility-administered medications for this visit.      Objective:   Physical Exam BP (!) 142/102   Pulse 77   Temp 98.4 F (36.9 C) (Oral)   Ht 5\' 1"  (1.549 m)   Wt 242 lb 6.4 oz (110 kg)   SpO2 99%   BMI 45.80 kg/m  Gen:  Patient sitting on exam table, appears stated age in no acute distress, well appearing.   Head: Normocephalic atraumatic Eyes: EOMI, PERRL, sclera and conjunctiva non-erythematous Ears:  Canals clear bilaterally.  TMs pearly gray bilaterally without erythema or bulging.   Nose:  Nasal turbinates grossly enlarged bilaterally. Some exudates noted. Tender to palpation of maxillary sinus  Mouth: Mucosa membranes moist. Tonsils +2, nonenlarged, non-erythematous. Neck: No cervical lymphadenopathy noted Heart:  RRR, no murmurs auscultated. Pulm:  Clear to auscultation bilaterally with good air movement.  No wheezes or rales noted. Extremities: No lower  extremity edema noted.  Imp/Plan: 1. URI with cough: - Likely viral illness based on symptoms and history.  Possibility of flu with concomitant diarrhea but less likely and 3-4 days in so wouldn't treat anyway.  Also mild illness.   No signs of bacterial illness. Symptomatic treatment for now, see instructions. Return if worsening or no improvement in 1 week.  - See AVS. ibuprofen for headaches. -Warning precautions provided.  2.  Diarrhea:  - No red flags.  Mild.  Well hydrated appearing.  -Symptomatic treatment.  See AVS.

## 2018-09-13 ENCOUNTER — Encounter: Payer: Self-pay | Admitting: Sports Medicine

## 2018-09-13 ENCOUNTER — Ambulatory Visit: Payer: Medicaid Other | Admitting: Sports Medicine

## 2018-09-13 ENCOUNTER — Ambulatory Visit (INDEPENDENT_AMBULATORY_CARE_PROVIDER_SITE_OTHER): Payer: Self-pay | Admitting: Sports Medicine

## 2018-09-13 ENCOUNTER — Encounter: Payer: Self-pay | Admitting: Family Medicine

## 2018-09-13 VITALS — BP 126/81 | Ht 61.0 in | Wt 220.0 lb

## 2018-09-13 DIAGNOSIS — S60031D Contusion of right middle finger without damage to nail, subsequent encounter: Secondary | ICD-10-CM

## 2018-09-13 MED ORDER — PREDNISONE 10 MG PO TABS: ORAL_TABLET | ORAL | 0 refills | Status: DC
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
## 2018-09-13 NOTE — Patient Instructions (Signed)
Instrucciones de prednisona: Network engineer: Tome 2 tabletas antes del desayuno, 1 tableta despus del almuerzo, 1 despus de la cena y 2 tabletas antes de acostarse.  2  da: Tome 1 tableta antes del desayuno, 1 tableta despus del almuerzo, 1 despus de la cena y 2 tabletas al [employer]  3er da: Tome 1 tableta antes del desayuno, 1 tableta despus del almuerzo, 1 despus de la cena y 1 tableta al acostarse  4to da: Tome 1 tableta antes del desayuno, 1 tableta despus del almuerzo y 1 tableta al [employer]  5to da: Tome 1 tableta antes del desayuno y 1 tableta antes de acostarse  6to da: Tomar 1 tableta antes del desayuno.

## 2018-09-13 NOTE — Progress Notes (Signed)
Today's interpreter was Meta Hatchet [phone] via [employer] video remote interepreter.

## 2018-10-02 ENCOUNTER — Ambulatory Visit (INDEPENDENT_AMBULATORY_CARE_PROVIDER_SITE_OTHER): Payer: Self-pay | Admitting: Family Medicine

## 2018-10-02 VITALS — BP 118/70 | HR 89 | Temp 98.9°F | Wt 242.0 lb

## 2018-10-02 DIAGNOSIS — N920 Excessive and frequent menstruation with regular cycle: Secondary | ICD-10-CM

## 2018-10-02 DIAGNOSIS — R1013 Epigastric pain: Secondary | ICD-10-CM

## 2018-10-02 DIAGNOSIS — G8929 Other chronic pain: Secondary | ICD-10-CM

## 2018-10-02 DIAGNOSIS — N939 Abnormal uterine and vaginal bleeding, unspecified: Secondary | ICD-10-CM

## 2018-10-02 LAB — POCT URINE PREGNANCY: Preg Test, Ur: NEGATIVE

## 2018-10-02 MED ORDER — OMEPRAZOLE 40 MG PO CPDR: 40.0000 mg | DELAYED_RELEASE_CAPSULE | Freq: Every day | ORAL | 3 refills | Status: DC

## 2018-10-02 MED ORDER — HYDROCORTISONE 1 % EX OINT: 1.0000 "application " | TOPICAL_OINTMENT | Freq: Two times a day (BID) | CUTANEOUS | 0 refills | Status: DC

## 2018-10-02 NOTE — Progress Notes (Signed)
Date of Visit: 10/02/2018   HPI:  Patient presents for a same day appointment to discuss several issues. Spanish interpreter utilized during today's visit.    Rash - itchy bumps on hands/arms for 4 days.no history of this before, no one else has the rash. No recent new medications or foods. No sores in mouth or genital area. No fever.  Tried vaseline & alcohol, didn't get better. Has used new gloves at work recently, wonders if those could be contributing.  Abdominal pain - also complains of abdominal pain, ongoing for several months. Was given loperamide. Has pain and burning in entire abdomen but especially at the top in epigastric area. Pain sometimes while eating, sometimes afterward. Has history of chronic abdominal pain and appears to have been treated for H pylori in the past. Having diarrhea but no vomiting. No blood in the stool. Not on PPI presently.  Irregular bleeding - has irregular spotting, on both OCP and nexplanon. Also history of uterine prolapse. Review of records shows OCP was added to nexplanon as she was having heavy bleeding with nexplanon. Now just has irregular spotting. Notes history of tubal ligation  ROS: See HPI  PMFSH: history of dysmenorrhea, menorrhagia, GERD, chronic abdominal pain  PHYSICAL EXAM: BP 118/70   Pulse 89   Temp 98.9 F (37.2 C) (Oral)   Wt 242 lb (109.8 kg)   SpO2 98%   BMI 45.73 kg/m  Gen: no acute distress, pleasant, cooperative, well appearing HEENT: normocephalic, atraumatic, moist mucous membranes  Heart: regular rate and rhythm, no murmur Lungs: clear to auscultation bilaterally, normal work of breathing  Abdomen: soft, nontender to palpation, normoactive bowel sounds, no masses Neuro: alert, speech normal, grossly nonfocal Skin: excoriations present on bilateral hands/wrists on dorsal aspect  ASSESSMENT/PLAN:  Menorrhagia Now having irregular spotting, on both nexplanon and combined OCP. Review of records shows she previously  had a pelvic ultrasound in Nov 2017 which was abnormal - showed heterogeneous and irregular endometrium, as well as left adnexal cystic/tubular structure. Patient was referred to GYN but it does not appear u/s findings were addressed at that time. Will repeat pelvic ultrasound to assess current pelvic anatomy. Patient instructed to schedule follow up with PCP for this and for other issues addressed today.  Abdominal pain, chronic, epigastric History somewhat vague. Previously treated for H pylori years ago. Complains of pain throughout abdomen but especially in epigastric area. Review of records shows she has had this for some time. Not currently on PPI. Will add omeprazole. Consider bentyl in future; may have component of IBS given also complains of diarrhea. Could also consider RUQ ultrasound to further assess biliary system. Follow up with PCP.   Rash on hands/wrists Unclear etiology, most likely contact dermatitis. Will rx topical steroid. Advised to change to different type of gloves at work. Follow up if not improving.  [place] J. Pollie Meyer, MD [facility]

## 2018-10-02 NOTE — Patient Instructions (Signed)
Getting ultrasound  Sent in ointment for your hands. Try different gloves at work.  Sent in prilosec for your stomach  Schedule appointment with Dr. Frances Furbish to follow up.  Be well, Dr. Pollie Meyer

## 2018-10-04 NOTE — Assessment & Plan Note (Signed)
History somewhat vague. Previously treated for H pylori years ago. Complains of pain throughout abdomen but especially in epigastric area. Review of records shows she has had this for some time. Not currently on PPI. Will add omeprazole. Consider bentyl in future; may have component of IBS given also complains of diarrhea. Could also consider RUQ ultrasound to further assess biliary system. Follow up with PCP.

## 2018-10-04 NOTE — Assessment & Plan Note (Signed)
Now having irregular spotting, on both nexplanon and combined OCP. Review of records shows she previously had a pelvic ultrasound in Nov 2017 which was abnormal - showed heterogeneous and irregular endometrium, as well as left adnexal cystic/tubular structure. Patient was referred to GYN but it does not appear u/s findings were addressed at that time. Will repeat pelvic ultrasound to assess current pelvic anatomy. Patient instructed to schedule follow up with PCP for this and for other issues addressed today.

## 2018-10-11 ENCOUNTER — Ambulatory Visit (INDEPENDENT_AMBULATORY_CARE_PROVIDER_SITE_OTHER): Payer: Self-pay | Admitting: Sports Medicine

## 2018-10-11 VITALS — BP 147/98 | Ht 61.0 in | Wt 222.0 lb

## 2018-10-11 DIAGNOSIS — M20021 Boutonniere deformity of right finger(s): Secondary | ICD-10-CM

## 2018-10-11 DIAGNOSIS — M20022 Boutonniere deformity of left finger(s): Secondary | ICD-10-CM
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
## 2018-10-11 NOTE — Patient Instructions (Addendum)
Gracias por venir a vernos Conservation officer, naturehoy en la clnica.  Te diagnosticaron hoy con una deformidad de boutonniere.  Este es el resultado de que el tendn en la parte posterior del dedo tiene un pequeo desgarro en l.  El tratamiento para esto es poner el dedo en extensin con una frula.  Le haremos ver a un cirujano de mano para evaluar si hay una necesidad de cualquier intervencin.  Le veremos para el seguimiento en 2 semanas.       Dr Mariel Sleethompson [facility] [address] [place] [phone] Thurs Feb 6th @ 330p You must bring $250 at [employer]

## 2018-10-12 ENCOUNTER — Ambulatory Visit (INDEPENDENT_AMBULATORY_CARE_PROVIDER_SITE_OTHER): Payer: Self-pay | Admitting: Orthopaedic Surgery

## 2018-10-12 ENCOUNTER — Ambulatory Visit (INDEPENDENT_AMBULATORY_CARE_PROVIDER_SITE_OTHER): Payer: Self-pay

## 2018-10-12 ENCOUNTER — Encounter (INDEPENDENT_AMBULATORY_CARE_PROVIDER_SITE_OTHER): Payer: Self-pay | Admitting: Orthopaedic Surgery

## 2018-10-12 VITALS — Ht 61.0 in | Wt 222.0 lb

## 2018-10-12 DIAGNOSIS — M79644 Pain in right finger(s): Secondary | ICD-10-CM

## 2018-10-12 MED ORDER — NAPROXEN 500 MG PO TABS: 500.0000 mg | ORAL_TABLET | Freq: Two times a day (BID) | ORAL | 3 refills | Status: DC
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
## 2018-10-18 ENCOUNTER — Ambulatory Visit (HOSPITAL_COMMUNITY)
Admission: RE | Admit: 2018-10-18 | Discharge: 2018-10-18 | Disposition: A | Discharge status: Home / Self Care | Payer: Medicaid Other | Source: Ambulatory Visit | Attending: Family Medicine | Admitting: Family Medicine

## 2018-10-18 DIAGNOSIS — N939 Abnormal uterine and vaginal bleeding, unspecified: Secondary | ICD-10-CM | POA: Insufficient documentation

## 2018-10-20 ENCOUNTER — Telehealth: Payer: Self-pay | Admitting: Family Medicine

## 2018-10-20 DIAGNOSIS — N8003 Adenomyosis of the uterus: Secondary | ICD-10-CM

## 2018-10-20 DIAGNOSIS — N809 Endometriosis, unspecified: Principal | ICD-10-CM

## 2018-10-20 NOTE — Telephone Encounter (Signed)
Called patient using phone interpreter to discuss ultrasound result. 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 In light of her bleeding despite OCP and nexplanon, recommend she sees GYN to discuss further.  Will place referral. FYI to PCP.  Patient appreciative.  Latrelle Dodrill, MD

## 2018-10-25 ENCOUNTER — Encounter: Payer: Self-pay | Admitting: Family Medicine

## 2018-10-25 ENCOUNTER — Other Ambulatory Visit: Payer: Self-pay

## 2018-10-25 ENCOUNTER — Ambulatory Visit (INDEPENDENT_AMBULATORY_CARE_PROVIDER_SITE_OTHER): Payer: Self-pay | Admitting: Family Medicine

## 2018-10-25 VITALS — BP 125/90 | HR 77 | Temp 98.9°F | Wt 240.0 lb

## 2018-10-25 DIAGNOSIS — R197 Diarrhea, unspecified: Secondary | ICD-10-CM

## 2018-10-25 MED ORDER — LOPERAMIDE HCL 2 MG PO TABS: 2.0000 mg | ORAL_TABLET | Freq: Four times a day (QID) | ORAL | 0 refills | Status: DC | PRN

## 2018-10-25 NOTE — Progress Notes (Signed)
   Subjective:    Deborah Chandler - 38 y.o. female MRN [id number]  Date of birth: 07-29-1981  CC:  Deborah Chandler is here for abdominal pain and diarrhea.  HPI: Abdominal pain and diarrhea - has occurred every day for almost one month - has 3-4 episodes of diarrhea per day but denies blood in stool - says that it smells bad and is watery - was given omeprazole, but this has not been helpful - no recent travel - location of pain is generalized - no one else in the family has these symptoms - she has been under more stress than usual - she has to urinate multiple times at night but does not have diarrhea during the night - no exacerbating factors, including after meals - no constitutional symptoms  Health Maintenance:  There are no preventive care reminders to display for this patient.  -  reports that she has never smoked. She has never used smokeless tobacco. - Review of Systems: Per HPI. - Past Medical History: Patient Active Problem List   Diagnosis Date Noted  . Pain in joint of right shoulder 07/31/2018  . Finger injury, right, subsequent encounter 07/19/2018  . Vaginal odor 07/10/2018  . UTI symptoms 07/10/2018  . Uterine prolapse 04/06/2018  . Acute bilateral low back pain without sciatica 04/06/2018  . Cough   . Hypoxia   . Pneumonia 02/27/2017  . Community acquired pneumonia of left lung   . Diarrhea 12/21/2016  . Spider veins of both lower extremities 08/17/2016  . Abdominal pain, chronic, epigastric 06/16/2016  . Dyspepsia 06/16/2016  . Vaginal discharge 05/28/2016  . Abdominal pain in female patient 05/03/2016  . Nausea and vomiting in adult patient 03/16/2016  . GERD (gastroesophageal reflux disease) 02/04/2016  . Candidal intertrigo 02/04/2016  . Dysmenorrhea 03/20/2015  . Menorrhagia 03/20/2015   - Medications: reviewed and updated   Objective:   Physical Exam BP 125/90   Pulse 77   Temp 98.9 F (37.2 C) (Oral)   Wt 240 lb (108.9  kg)   SpO2 99%   BMI 45.35 kg/m  Gen: NAD, alert, cooperative with exam, well-appearing CV: RRR, good S1/S2, no murmur, no edema Resp: CTABL, no wheezes, non-labored Abd: SNTND, BS normoactive present, no guarding or organomegaly Skin: no rashes, normal turgor       Assessment & Plan:   Diarrhea Patient has had diarrhea on and off for several years according to chart review, so this may be IBS related, but it still warrants a work-up since her's current episode has lasted for about 1 month.  Will perform a BMP to check her electrolytes and an ova and parasite examination.  Prescribed loperamide to help reduce her diarrhea.    Lezlie Octave, M.D. 10/25/2018, 12:16 PM PGY-2, [facility]

## 2018-10-25 NOTE — Assessment & Plan Note (Signed)
Patient has had diarrhea on and off for several years according to chart review, so this may be IBS related, but it still warrants a work-up since her's current episode has lasted for about 1 month.  Will perform a BMP to check her electrolytes and an ova and parasite examination.  Prescribed loperamide to help reduce her diarrhea.

## 2018-10-26 LAB — BASIC METABOLIC PANEL
BUN/Creatinine Ratio: 15 (ref 9–23)
BUN: 10 mg/dL (ref 6–20)
CO2: 24 mmol/L (ref 20–29)
Calcium: 9.3 mg/dL (ref 8.7–10.2)
Chloride: 103 mmol/L (ref 96–106)
Creatinine, Ser: 0.68 mg/dL (ref 0.57–1.00)
GFR calc Af Amer: 130 mL/min/{1.73_m2} (ref >59)
GFR calc non Af Amer: 113 mL/min/{1.73_m2} (ref >59)
Glucose: 112 mg/dL — ABNORMAL HIGH (ref 65–99)
Potassium: 4.7 mmol/L (ref 3.5–5.2)
Sodium: 141 mmol/L (ref 134–144)

## 2018-10-27 ENCOUNTER — Other Ambulatory Visit: Payer: Self-pay | Admitting: *Deleted

## 2018-10-27 NOTE — Telephone Encounter (Signed)
Opened in error. Fleeger, Jessica Dawn, CMA  

## 2018-11-01 ENCOUNTER — Telehealth: Payer: Self-pay

## 2018-11-01 LAB — OVA AND PARASITE EXAMINATION

## 2018-11-01 NOTE — Telephone Encounter (Signed)
-----   Message from Lennox Solders, MD sent at 11/01/2018  3:38 PM EST ----- Please let patient know that her results were normal, meaning that she does not have parasites causing her diarrhea.  Thanks!

## 2018-11-01 NOTE — Telephone Encounter (Signed)
Spoke to patient using [employer] Madelaine Bhat, [id number]). Gave pt the results below. Pt wanted to let Dr. Frances Furbish know that she still has the diarrhea. Offered to make pt an appt with a  Dr. But patient was only wanting to see Dr. Frances Furbish. Dr. Starr Sinclair first available isn't until March.

## 2018-11-08 ENCOUNTER — Telehealth: Payer: Self-pay | Admitting: *Deleted

## 2018-11-08 ENCOUNTER — Telehealth (INDEPENDENT_AMBULATORY_CARE_PROVIDER_SITE_OTHER): Payer: Self-pay | Admitting: *Deleted

## 2018-11-08 NOTE — Telephone Encounter (Signed)
[place] with [facility] is calling wanting to know where we had sent pt for PT/OT, states does not see referral to where anything was sent, pt is still having pain and needs to be referred.   Please call   [phone]

## 2018-11-08 NOTE — Telephone Encounter (Signed)
Talked to [place] at [facility] and she will ask Dr Warren Danes assistant about the hand therapy appointment as well as the 6 week f/u.   Informed Martie Lee as an Retail buyer that [facility] does NOT take adult medicaid because that is where we tried to send her before we made the ortho referral.

## 2018-11-10 NOTE — Telephone Encounter (Signed)
Faxed referral to [facility]. Rx scanned in the media tab

## 2018-11-14 ENCOUNTER — Encounter: Payer: Self-pay | Admitting: *Deleted

## 2018-11-14 ENCOUNTER — Other Ambulatory Visit: Payer: Self-pay

## 2018-11-14 ENCOUNTER — Ambulatory Visit: Payer: Self-pay | Attending: Orthopaedic Surgery | Admitting: *Deleted

## 2018-11-14 DIAGNOSIS — M6281 Muscle weakness (generalized): Secondary | ICD-10-CM | POA: Insufficient documentation

## 2018-11-14 DIAGNOSIS — M25541 Pain in joints of right hand: Secondary | ICD-10-CM | POA: Insufficient documentation

## 2018-11-14 DIAGNOSIS — M79641 Pain in right hand: Secondary | ICD-10-CM | POA: Insufficient documentation

## 2018-11-14 DIAGNOSIS — R278 Other lack of coordination: Secondary | ICD-10-CM | POA: Insufficient documentation

## 2018-11-14 NOTE — Patient Instructions (Addendum)
Flexor Tendon Gliding (Active Full Fist)    Comience con los dedos estirados, doble todas las articulaciones completando un puo. Repita __10-15__ veces. Haga _2-3__ sesiones por da. Hold for 5 seconds for stretch.   Flexor Tendon Gliding (Active Hook Fist)    Con los dedos y nudillos estirados, doble las articulaciones medias y [place]. Mantenga los nudillos [employer]. Repita __5-10__ veces. Haga __2-3__ sesiones por da.   Finger Flexion / Extension    Con la palma hacia arriba doble los dedos de la mano [place] arriba, cerrando el puo sin apretar. Estire los dedos abriendo el puo. Repita _10__ veces/sesin. Haga _2-3_ sesiones/semana. Hold x3-5 seconds each for stretch. Posicin de las manos: Palmas hacia arriba   Putty-in-Your-Hand    Apriete lentamente una masilla o pelota de goma blanda mientras respira normalmente. Repita con la otra mano. _5-10_ veces. Haga _only 1-2___ sesiones por da (decrease reps or times per day with increased pain)   Repita la serie __Three Jaw Psychologist, clinical (Resistive Putty)    Tire hacia arriba usando los dedos pulgar, ndice y Forensic scientist. Repita _5-10__ veces. Haga _1-2__ sesiones por da. Decrease reps or times per day with increased pain/symptoms.  Copyright  VHI. All rights reserved.    http://[email or web address].exer.us/885
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
## 2018-11-21 ENCOUNTER — Encounter: Payer: Self-pay | Admitting: Occupational Therapy

## 2018-11-21 ENCOUNTER — Ambulatory Visit: Payer: Self-pay | Admitting: Occupational Therapy

## 2018-11-21 DIAGNOSIS — M79641 Pain in right hand: Secondary | ICD-10-CM

## 2018-11-21 DIAGNOSIS — M25541 Pain in joints of right hand: Secondary | ICD-10-CM

## 2018-11-21 DIAGNOSIS — R278 Other lack of coordination: Secondary | ICD-10-CM

## 2018-11-21 DIAGNOSIS — M6281 Muscle weakness (generalized): Secondary | ICD-10-CM

## 2018-11-21 NOTE — Therapy (Signed)
[facility] [address] [place], [place], [address] Phone: [phone]   Fax:  [phone]  Occupational Therapy Treatment  Patient Details  Name: Deborah Chandler MRN: [id number] Date of Birth: August 05, 1981 Referring Provider (OT): Dr Glee Arvin   Encounter Date: 11/21/2018  OT End of Session - 11/21/18 1300    Visit Number  2    Number of Visits  5    Date for OT Re-Evaluation  01/23/19    Authorization Type  Pt is Self Pay - will limit visits to assist with accomodating pt     OT Start Time  1017    OT Stop Time  1102    OT Time Calculation (min)  45 min    Activity Tolerance  Patient tolerated treatment well    Behavior During Therapy  [place] for tasks assessed/performed       Past Medical History:  Diagnosis Date  . Anemia    ?  . Gastritis   . H. pylori infection   . Ovarian cyst     Past Surgical History:  Procedure Laterality Date  . WISDOM TOOTH EXTRACTION     one    There were no vitals filed for this visit.  Subjective Assessment - 11/21/18 1028    Subjective   Pt reports that she has pain with HEP    Patient is accompanied by:  Interpreter    Currently in Pain?  Yes    Pain Score  8     Pain Location  Hand    Pain Orientation  Right    Pain Descriptors / Indicators  Aching;Sore    Pain Type  Chronic pain    Pain Onset  More than a month ago    Pain Frequency  Intermittent    Aggravating Factors   exercises, gripping    Pain Relieving Factors  rest         Fludio x to right hand for pain, stiffness, desensitization with no adverse reactions.    Tendon glides x10 with min cues.  Ultrasound x to dorsal wrist/forearm , 0.8wts/cm2, 50% pulsed with no adverse reactions for pain/tightness/edema.  Reviewed initial HEP and added updates--see pt instructions               OT Education - 11/21/18 1258    Education Details  Reviewed initial HEP; Added updates  to HEP; Recommended use of heat prior to HEP and ice after (interpreter wrote down instructions in Spanish)    Person(s) Educated  Patient    Methods  Explanation;Demonstration;Handout;Verbal cues    Comprehension  Verbalized understanding;Returned demonstration       OT Short Term Goals - 11/14/18 1135      OT SHORT TERM GOAL #1   Title  STG's = LTG's        OT Long Term Goals - 11/14/18 1135      OT LONG TERM GOAL #1   Title  Pt will be Mod I HEP R dominant hand    Baseline  req Min VC's and TC's    Time  4    Period  Weeks    Status  New    Target Date  12/12/18      OT LONG TERM GOAL #2   Title  Pt will demonstrate imporved grip right dominant hand as seen by JAMAR assessment of 31# or more.    Baseline  11/14/2018 26# Right     Time  4  Period  Weeks    Status  New    Target Date  12/12/18      OT LONG TERM GOAL #3   Title  Pt will report improved functional use Right dominant hand for ADL's (WU:JWJXBJY laundry, light household activities, opening a door etc) at Mod I level.    Baseline  11/14/2018 Requires PRN assist from family    Time  4    Period  Weeks    Status  New    Target Date  12/12/18      OT LONG TERM GOAL #4   Title  Pt will report improved pain R middle finger as seen by rating of 3/10 or less.    Baseline  11/14/2018 5/10 R MF PIPJ pain at rest.    Time  4    Period  Weeks    Status  New    Target Date  12/12/18            Plan - 11/21/18 1301    Clinical Impression Statement  Pt is progressing slowly toward goals and returned down initial HEP and updates to HEP.  Pt with tightness/pain from 3rd digit up forarm dorsally.     OT Occupational Profile and History  Problem Focused Assessment - Including review of records relating to presenting problem    Occupational performance deficits (Please refer to evaluation for details):  ADL's;IADL's    Body Structure / Function / Physical Skills  ADL;Dexterity;Flexibility;ROM;Strength;Pain;UE  functional use    Rehab Potential  Good    Clinical Decision Making  Limited treatment options, no task modification necessary    Comorbidities Affecting Occupational Performance:  None    Modification or Assistance to Complete Evaluation   No modification of tasks or assist necessary to complete eval    OT Frequency  1x / week   Pt is Self Pay   OT Duration  4 weeks    OT Treatment/Interventions  Self-care/ADL training;Therapeutic exercise;Ultrasound;Fluidtherapy;Therapeutic activities;Patient/family education;Moist Heat;Paraffin    Plan  Continue with modalities and update HEP as able. Pt education re: positioning during functional activity if she has increased pain.    Recommended Other Services  Interpreter for visits; F/U with MD PRN    Consulted and Agree with Plan of Care  Patient       Patient will benefit from skilled therapeutic intervention in order to improve the following deficits and impairments:  Body Structure / Function / Physical Skills  Visit Diagnosis: Muscle weakness (generalized)  Pain in joint of right hand  Pain in right hand  Other lack of coordination    Problem List Patient Active Problem List   Diagnosis Date Noted  . Pain in joint of right shoulder 07/31/2018  . Finger injury, right, subsequent encounter 07/19/2018  . Vaginal odor 07/10/2018  . UTI symptoms 07/10/2018  . Uterine prolapse 04/06/2018  . Acute bilateral low back pain without sciatica 04/06/2018  . Cough   . Hypoxia   . Pneumonia 02/27/2017  . Community acquired pneumonia of left lung   . Diarrhea 12/21/2016  . Spider veins of both lower extremities 08/17/2016  . Abdominal pain, chronic, epigastric 06/16/2016  . Dyspepsia 06/16/2016  . Vaginal discharge 05/28/2016  . Abdominal pain in female patient 05/03/2016  . Nausea and vomiting in adult patient 03/16/2016  . GERD (gastroesophageal reflux disease) 02/04/2016  . Candidal intertrigo 02/04/2016  . Dysmenorrhea 03/20/2015  .  Menorrhagia 03/20/2015    Nyheem Binette 11/21/2018, 1:03 PM  [facility] [address] [place], [place], [address] Phone: [phone]   Fax:  [phone]  Name: Maley Bible MRN: [id number] Date of Birth: [birth year]   Willa Frater, OTR/L [facility] [address] [place], [place]  [address] [phone] phone [phone] 11/21/18 1:03 PM

## 2018-11-21 NOTE — Patient Instructions (Signed)
PIP Extension (Active Controlled With Wrist and MP Flexion)    Use su otra mano para sostener la [place] a ____  de flexin y las MFs a ____  de flexin, estire las articulaciones distales de los dedos. Repita ____ veces. Haga ____ sesiones por da.  Copyright  VHI. All rights reserved.   Copyright  VHI. All rights reserved.    DIP Flexion (Active Blocked)    Sostenga la articulacin media del dedo  firmemente y doble la punta del dedo. Mantenga 3 segundos. Repita 10 veces. Haga 2-3 sesiones por Futures trader.  Copyright  VHI. All rights reserved.

## 2018-11-27 ENCOUNTER — Other Ambulatory Visit: Payer: Self-pay

## 2018-11-27 ENCOUNTER — Ambulatory Visit: Payer: Self-pay | Admitting: Occupational Therapy

## 2018-11-27 ENCOUNTER — Encounter: Payer: Self-pay | Admitting: Occupational Therapy

## 2018-11-27 DIAGNOSIS — M79641 Pain in right hand: Secondary | ICD-10-CM

## 2018-11-27 DIAGNOSIS — M6281 Muscle weakness (generalized): Secondary | ICD-10-CM

## 2018-11-27 DIAGNOSIS — M25541 Pain in joints of right hand: Secondary | ICD-10-CM

## 2018-11-27 DIAGNOSIS — R278 Other lack of coordination: Secondary | ICD-10-CM

## 2018-11-27 NOTE — Patient Instructions (Signed)
Extension (Active With Finger Extension)    Con el antebrazo sobre la mesa y la mano por fuera del borde, levante la mano con los dedos extendidos. Mantenga 3 segundos. Repita 5 veces. Haga 2-3 sesiones por Futures trader.  Copyright  VHI. All rights reserved.  Flexion (Active With Finger Extension)    Deje caer la mano por fuera del borde de la mesa con la palma hacia abajo. Dblela lo ms abajo que pueda. Mantenga _3_ segundos. Repita 5 veces. Haga 2-3 sesiones por Futures trader.  Copyright  VHI. All rights reserved.   Flexor Tendon Gliding (Active Hook Fist)    Con los dedos y nudillos estirados, doble las articulaciones medias y [place]. Mantenga los nudillos [employer]. Repita __5__ veces. Haga __2-3__ sesiones por da.   Flexor Tendon Gliding (Active Full Fist)    Comience con los dedos estirados, doble todas las articulaciones completando un puo. Repita __5__ veces. Haga _2-3__ sesiones por da. Hold for 5 seconds for stretch.   Flexor Tendon Gliding (Active Straight Fist)    Comience con los dedos estirados, doble los nudillos mayores y Psychiatrist. Mantenga las puntas estiradas para tocar la base de la palma. Repita 3 veces. Haga 2-3 sesiones por Futures trader.  Copyright  VHI. All rights reserved.

## 2018-11-27 NOTE — Therapy (Signed)
[facility] [address] [place], [place], [address] Phone: [phone]   Fax:  [phone]  Occupational Therapy Treatment  Patient Details  Name: Deborah Chandler MRN: [id number] Date of Birth: [birth year] Referring Provider (OT): Dr Glee Arvin   Encounter Date: 11/27/2018  OT End of Session - 11/27/18 1409    Visit Number  3    Number of Visits  5    Date for OT Re-Evaluation  01/23/19    Authorization Type  Pt is Self Pay - will limit visits to assist with accomodating pt     OT Start Time  1405    OT Stop Time  1500    OT Time Calculation (min)  55 min    Activity Tolerance  Patient tolerated treatment well    Behavior During Therapy  [facility] for tasks assessed/performed       Past Medical History:  Diagnosis Date  . Anemia    ?  . Gastritis   . H. pylori infection   . Ovarian cyst     Past Surgical History:  Procedure Laterality Date  . WISDOM TOOTH EXTRACTION     one    There were no vitals filed for this visit.  Subjective Assessment - 11/27/18 1419    Subjective   Pt reports that she has pain with HEP    Patient is accompanied by:  Interpreter;Family member   via phone interpreter line   Pain Score  8     Pain Location  Arm    Pain Orientation  Right    Pain Descriptors / Indicators  --   hurts a lot   Pain Onset  More than a month ago    Pain Frequency  Intermittent    Aggravating Factors   exercises    Pain Relieving Factors  rest       Fludio x 9 min to right hand/wrist for pain, stiffness with no adverse reactions.    AROM wrist flex/ext x5 reps each.  Tendon glides x5.  Instructed pt to perform exercises gently and don't force movement, move within pain tolerance with goal for pain to be 5/10 or less.--New   Soft tissue mobs/cross friction massage to R forearm/wrist dorsally along where pt is experiencing pain (medially along 3rd digit extensors).  Pt educated in how to perform  this at home (along muscle, across, and in circles).  Pt instructed to follow-up MD with incr pain/concerns with gentle AROM.    Pt was informed of the temporary closing of OP Rehab Services due to Covid-19.  OP Rehabilitation Services will follow up with patients when we are able to resume care.      OT Education - 11/27/18 1515    Education Details  Modified/updated HEP (tendon glides and gentle AROM wrist flex/ext only) with emphasis on gentle movement and only 5 reps at a time (2-3x/day) moving within pain tolerance (no more than 5/10 pain), d/c putty and blocked exercises.  Education regarding neutral wrist positioning during functional activities.  Reviewed use of heat/ice for pain management.  Education regarding massage for R forearm/wrist.    Person(s) Educated  Patient    Methods  Explanation;Demonstration;Handout;Verbal cues    Comprehension  Verbalized understanding;Returned demonstration       OT Short Term Goals - 11/14/18 1135      OT SHORT TERM GOAL #1   Title  STG's = LTG's        OT Long Term Goals - 11/14/18  1135      OT LONG TERM GOAL #1   Title  Pt will be Mod I HEP R dominant hand    Baseline  req Min VC's and TC's    Time  4    Period  Weeks    Status  New    Target Date  12/12/18      OT LONG TERM GOAL #2   Title  Pt will demonstrate imporved grip right dominant hand as seen by JAMAR assessment of 31# or more.    Baseline  11/14/2018 26# Right     Time  4    Period  Weeks    Status  New    Target Date  12/12/18      OT LONG TERM GOAL #3   Title  Pt will report improved functional use Right dominant hand for ADL's (ZO:XWRUEAVe:folding laundry, light household activities, opening a door etc) at Mod I level.    Baseline  11/14/2018 Requires PRN assist from family    Time  4    Period  Weeks    Status  New    Target Date  12/12/18      OT LONG TERM GOAL #4   Title  Pt will report improved pain R middle finger as seen by rating of 3/10 or less.     Baseline  11/14/2018 5/10 R MF PIPJ pain at rest.    Time  4    Period  Weeks    Status  New    Target Date  12/12/18            Plan - 11/27/18 1412    Clinical Impression Statement  Pt reports decr pain 5/10 exercises with modifications.  Also reviewed use of heat/ice for pain management and recommended massage to dorsal wrist/forearm.    OT Occupational Profile and History  Problem Focused Assessment - Including review of records relating to presenting problem    Occupational performance deficits (Please refer to evaluation for details):  ADL's;IADL's    Body Structure / Function / Physical Skills  ADL;Dexterity;Flexibility;ROM;Strength;Pain;UE functional use    Rehab Potential  Good    Clinical Decision Making  Limited treatment options, no task modification necessary    Comorbidities Affecting Occupational Performance:  None    Modification or Assistance to Complete Evaluation   No modification of tasks or assist necessary to complete eval    OT Frequency  1x / week   Pt is Self Pay   OT Duration  4 weeks    OT Treatment/Interventions  Self-care/ADL training;Therapeutic exercise;Ultrasound;Fluidtherapy;Therapeutic activities;Patient/family education;Moist Heat;Paraffin    Plan  Continue with education for positioning during functional activities, reivew/update HEP dependent on pain, continue with modalities    Recommended Other Services  Interpreter for visits; F/U with MD PRN    Consulted and Agree with Plan of Care  Patient       Patient will benefit from skilled therapeutic intervention in order to improve the following deficits and impairments:  Body Structure / Function / Physical Skills  Visit Diagnosis: Muscle weakness (generalized)  Pain in joint of right hand  Pain in right hand  Other lack of coordination    Problem List Patient Active Problem List   Diagnosis Date Noted  . Pain in joint of right shoulder 07/31/2018  . Finger injury, right, subsequent  encounter 07/19/2018  . Vaginal odor 07/10/2018  . UTI symptoms 07/10/2018  . Uterine prolapse 04/06/2018  . Acute bilateral low back pain  without sciatica 04/06/2018  . Cough   . Hypoxia   . Pneumonia 02/27/2017  . Community acquired pneumonia of left lung   . Diarrhea 12/21/2016  . Spider veins of both lower extremities 08/17/2016  . Abdominal pain, chronic, epigastric 06/16/2016  . Dyspepsia 06/16/2016  . Vaginal discharge 05/28/2016  . Abdominal pain in female patient 05/03/2016  . Nausea and vomiting in adult patient 03/16/2016  . GERD (gastroesophageal reflux disease) 02/04/2016  . Candidal intertrigo 02/04/2016  . Dysmenorrhea 03/20/2015  . Menorrhagia 03/20/2015    [facility] 11/27/2018, 3:23 PM  [facility] [address] [place], [place], [address] Phone: [phone]   Fax:  [phone]  Name: Deborah Chandler MRN: [id number] Date of Birth: [birth year]   Willa Frater, OTR/L [facility] [address] [place], [place]  [address] [phone] phone [phone] 11/27/18 3:33 PM

## 2018-11-30 ENCOUNTER — Telehealth: Payer: Self-pay | Admitting: Occupational Therapy

## 2018-11-30 ENCOUNTER — Ambulatory Visit: Payer: Self-pay | Admitting: Occupational Therapy

## 2018-11-30 NOTE — Telephone Encounter (Signed)
Pt was contacted today regarding the temporary closing of OP Rehab Services due to Covid-19.  Therapist discussed:  Pain since last session.  Improved some to 5/10 with exercise per pt.  Pt encourage to continue with recommendations until next appt.  Pt verbalized understanding (via interpreter though language services line).  OP Rehabilitation Services will follow up with patients when we are able to resume care or pt to call with further concerns.   Willa Frater, OTR/L [facility] [address] [place], [place]  [address] [phone] phone [phone] 11/30/18 1:57 PM    [facility] [address] [place], [place]  [address] Phone:  [phone] Fax:  [phone] \

## 2018-12-05 ENCOUNTER — Telehealth (INDEPENDENT_AMBULATORY_CARE_PROVIDER_SITE_OTHER): Payer: Self-pay | Admitting: Family Medicine

## 2018-12-05 ENCOUNTER — Ambulatory Visit: Payer: Medicaid Other | Admitting: Family Medicine

## 2018-12-05 ENCOUNTER — Other Ambulatory Visit: Payer: Self-pay

## 2018-12-05 DIAGNOSIS — R197 Diarrhea, unspecified: Secondary | ICD-10-CM

## 2018-12-05 MED ORDER — TINIDAZOLE 500 MG PO TABS: 2.0000 g | ORAL_TABLET | Freq: Once | ORAL | 0 refills | Status: AC

## 2018-12-05 NOTE — Progress Notes (Signed)
[facility] Telemedicine Visit  Patient consented to have visit conducted via telephone.  Encounter participants: Patient: Deborah Chandler  Provider: Solmon Ice Mariyah Upshaw  Others (if applicable): Dr. Leveda Anna, [facility] Spanish interpreter  Chief Complaint: Diarrhea  HPI: Patient has been having diarrhea off and on for quite some time.  She states that her stomach hurts "every day."  She also states that she has been having diarrhea at least 4-5 times every day.  She has been worked up in the past by her PCP and most recently had BMP which was within normal limits and stool O&P which was negative.  She was given loperamide, which the patient says does calm her diarrhea, but it ultimately will come back.  She states that she is no longer taking the medication, but it is unclear if this is because she ran out or if she cannot find the prescription.  She denies any blood in the stool, but describes the stool as slimy and greasy.  She states that she goes to the bathroom almost every time she eats.  She denies any fevers, chills, weight loss.  She has used omeprazole in the past and said that it helped her stomach symptoms, but ultimately did not help the diarrhea.  ROS: Per HPI  Pertinent PMHx: GERD, chronic epigastric abdominal pain  Exam:  Respiratory: Patient able to speak in complete sentences on the phone does not appear to be in any respiratory distress  Assessment/Plan:  Diarrhea It appears the patient has had this on and off for several years, IBS could be considered, most recent BMP and O&P were negative.  Did see some improvement with loperamide but is not taking at this time.  Discussed case with Dr. Leveda Anna.  Given that patient is now endorsing somewhat greasy and slimy stools, will opt to treat her for giardiasis.  Should she continue to have this, would consider GI referral and work-up for malabsorption.  No red flag symptoms. -tinidazole 2g once -Patient  advised that she can continue to take loperamide, but should not take at the same time as tinidazole -Patient scheduled for follow-up telemedicine visit on 4/30, will need to call interpreter for this visit -Return precautions discussed with patient, she voiced understanding    Time spent on phone with patient: 18 minutes

## 2018-12-05 NOTE — Assessment & Plan Note (Addendum)
It appears the patient has had this on and off for several years, IBS could be considered, most recent BMP and O&P were negative.  Did see some improvement with loperamide but is not taking at this time.  Discussed case with Dr. Leveda Anna.  Given that patient is now endorsing somewhat greasy and slimy stools, will opt to treat her for giardiasis.  Should she continue to have this, would consider GI referral and work-up for malabsorption.  No red flag symptoms. -tinidazole 2g once -Patient advised that she can continue to take loperamide, but should not take at the same time as tinidazole -Patient scheduled for follow-up telemedicine visit on 4/30, will need to call interpreter for this visit -Return precautions discussed with patient, she voiced understanding

## 2018-12-07 ENCOUNTER — Telehealth: Payer: Self-pay | Admitting: *Deleted

## 2018-12-07 ENCOUNTER — Encounter: Payer: Medicaid Other | Admitting: Occupational Therapy

## 2018-12-07 DIAGNOSIS — R197 Diarrhea, unspecified: Secondary | ICD-10-CM

## 2018-12-07 NOTE — Telephone Encounter (Signed)
Tinidazole is not covered. See Below:      Please let RN team know if you would like to pursue PA. Jone Baseman, CMA

## 2018-12-08 MED ORDER — METRONIDAZOLE 500 MG PO TABS: 500.0000 mg | ORAL_TABLET | Freq: Two times a day (BID) | ORAL | 0 refills | Status: AC

## 2018-12-08 NOTE — Telephone Encounter (Signed)
Will send metronidazole instead.

## 2018-12-13 ENCOUNTER — Encounter: Payer: Medicaid Other | Admitting: Occupational Therapy

## 2018-12-19 ENCOUNTER — Telehealth: Payer: Self-pay | Admitting: Obstetrics and Gynecology

## 2018-12-19 NOTE — Telephone Encounter (Signed)
Used Eda as Interpreter to call patient about her appointment. Eda stated the daughter answered the phone, and stated her mother will not be doing this, and then hanged the phone up.

## 2018-12-22 ENCOUNTER — Telehealth: Payer: Self-pay | Admitting: Obstetrics and Gynecology

## 2018-12-22 NOTE — Telephone Encounter (Signed)
Spanish interpreter ID # [id number] was used to contact patient about her appointment on 4/20 being a virtual visit. Patient stated that she does not have a device to put the app on. Patient informed that the visit will be done via telephone.  Patient verbalized understanding.

## 2018-12-25 ENCOUNTER — Other Ambulatory Visit: Payer: Self-pay

## 2018-12-25 ENCOUNTER — Ambulatory Visit (INDEPENDENT_AMBULATORY_CARE_PROVIDER_SITE_OTHER): Payer: Self-pay | Admitting: Obstetrics and Gynecology

## 2018-12-25 ENCOUNTER — Telehealth: Payer: Self-pay

## 2018-12-25 ENCOUNTER — Encounter: Payer: Medicaid Other | Admitting: Obstetrics & Gynecology

## 2018-12-25 ENCOUNTER — Encounter: Payer: Self-pay | Admitting: Obstetrics and Gynecology

## 2018-12-25 DIAGNOSIS — N814 Uterovaginal prolapse, unspecified: Secondary | ICD-10-CM

## 2018-12-25 NOTE — Progress Notes (Signed)
   TELEHEALTH VIRTUAL GYNECOLOGY VISIT ENCOUNTER NOTE  I connected with Deborah Chandler on 12/25/18 at  2:35 PM EDT by telephone at home and verified that I am speaking with the correct person using two identifiers.   I discussed the limitations, risks, security and privacy concerns of performing an evaluation and management service by telephone and the availability of in person appointments. I also discussed with the patient that there may be a patient responsible charge related to this service. The patient expressed understanding and agreed to proceed.   History:  Deborah Chandler is a 38 y.o. [id number] female being evaluated today for AUB. She was referred by [facility] for this, this past Jan. Pt reports this has resolved with a course of OCP's. Cycles are regular now. Her only concern today is that she feels "something" in her vagina. Has been told in the past she has uterine prolapse. However, last pelvic exam by FM was normal. Had normal GYN U/S 2/20. She has TSVD x 2. BTL. Has Nexplanon in place as well. She denies any abnormal vaginal discharge, bleeding, pelvic pain or other concerns.       Past Medical History:  Diagnosis Date  . Anemia    ?  . Gastritis   . H. pylori infection   . Ovarian cyst    Past Surgical History:  Procedure Laterality Date  . WISDOM TOOTH EXTRACTION     one   The following portions of the patient's history were reviewed and updated as appropriate: allergies, current medications, past family history, past medical history, past social history, past surgical history and problem list.   .   Review of Systems:  Pertinent items noted in HPI and remainder of comprehensive ROS otherwise negative.  Physical Exam:   General:  Alert, oriented and cooperative.   Mental Status: Normal mood and affect perceived. Normal judgment and thought content.  Physical exam deferred due to nature of the encounter  Labs and Imaging No results found for this or any  previous visit (from the past 336 hour(s)). No results found.    Assessment and Plan:     1. Uterine prolapse Pt instructed will need face to face visit for further eval. Will schedule once COVID 19 restrictions have been lifted Interrupter was used during today's visit       I discussed the assessment and treatment plan with the patient. The patient was provided an opportunity to ask questions and all were answered. The patient agreed with the plan and demonstrated an understanding of the instructions.   The patient was advised to call back or seek an in-person evaluation/go to the ED if the symptoms worsen or if the condition fails to improve as anticipated.  I provided 15 minutes of non-face-to-face time during this encounter.   Hermina Staggers, MD Center for [facility], [facility]

## 2018-12-25 NOTE — Progress Notes (Signed)
Pt states is having pain on left side of abdomen,just started today. Pt does not know what this visit is about.

## 2018-12-25 NOTE — Telephone Encounter (Signed)
Tried to call cm for telehealth visit using [employer]. Dannielle id# [id number], no answer, he left VM.

## 2019-01-04 ENCOUNTER — Telehealth: Payer: Self-pay | Admitting: Family Medicine

## 2019-01-04 ENCOUNTER — Other Ambulatory Visit: Payer: Self-pay

## 2019-01-04 NOTE — Progress Notes (Signed)
Attempted to call patient but no answer after several attempts. LVM for patient to call back before 5pm if she still needed to speak with a provider.

## 2019-02-06 ENCOUNTER — Ambulatory Visit (INDEPENDENT_AMBULATORY_CARE_PROVIDER_SITE_OTHER): Payer: Self-pay | Admitting: Family Medicine

## 2019-02-06 ENCOUNTER — Other Ambulatory Visit: Payer: Self-pay

## 2019-02-06 ENCOUNTER — Encounter: Payer: Self-pay | Admitting: Family Medicine

## 2019-02-06 VITALS — BP 118/80 | HR 75 | Temp 98.7°F | Wt 240.0 lb

## 2019-02-06 DIAGNOSIS — R1013 Epigastric pain: Secondary | ICD-10-CM

## 2019-02-06 MED ORDER — OMEPRAZOLE 40 MG PO CPDR: 40.0000 mg | DELAYED_RELEASE_CAPSULE | Freq: Every day | ORAL | 3 refills | Status: DC

## 2019-02-06 MED ORDER — FAMOTIDINE 40 MG PO TABS: 40.0000 mg | ORAL_TABLET | Freq: Every day | ORAL | 2 refills | Status: DC

## 2019-02-06 NOTE — Patient Instructions (Signed)
  [employer] medicamentos para su estomago.  Toma ambos medicamentos una vez al dia.  Puede tomar Galviscon (la pictora aqui):  Estamos obteniendo un examin hoy para investigar si tiene una bacteria que puede causar sus sintomas.  Espero que se sienta mejor pronto!

## 2019-02-06 NOTE — Progress Notes (Signed)
   Subjective:    Deborah Chandler - 38 y.o. female MRN [id number]  Date of birth: 1980-10-20  CC:  Deborah Chandler is here for stomach pain and vomiting.  HPI: Stomach pain and vomiting Reports pain in the top of her stomach that has been going on for about a month When she comes home from work, she has to vomit, but does not actually vomit food Will have a bitter taste in her mouth after she vomits Will also have pain and burning in her chest and throat when this occurs She vomits about once per day and says that her vomit mostly contains clear spit She has been diagnosed with gastritis in the past and took omeprazole at the time, which was temporarily helpful but did completely alleviate her pain She cannot identify any triggering foods or alleviating factors Lying flat at night will exacerbate her symptoms   Health Maintenance: There are no preventive care reminders to display for this patient.  -  reports that she has never smoked. She has never used smokeless tobacco. - Review of Systems: Per HPI. - Past Medical History: Patient Active Problem List   Diagnosis Date Noted  . Pain in joint of right shoulder 07/31/2018  . Finger injury, right, subsequent encounter 07/19/2018  . Uterine prolapse 04/06/2018  . Acute bilateral low back pain without sciatica 04/06/2018  . Cough   . Diarrhea 12/21/2016  . Spider veins of both lower extremities 08/17/2016  . Abdominal pain, chronic, epigastric 06/16/2016  . Dyspepsia 06/16/2016  . Abdominal pain in female patient 05/03/2016  . Nausea and vomiting in adult patient 03/16/2016  . GERD (gastroesophageal reflux disease) 02/04/2016  . Candidal intertrigo 02/04/2016  . Dysmenorrhea 03/20/2015  . Menorrhagia 03/20/2015   - Medications: reviewed and updated   Objective:   Physical Exam BP 118/80   Pulse 75   Temp 98.7 F (37.1 C) (Oral)   Wt 240 lb (108.9 kg)   SpO2 97%   BMI 45.35 kg/m  Gen: NAD, alert,  cooperative with exam, well-appearing, obese HEENT: NCAT, clear conjunctiva, MMM CV: RRR, good S1/S2, no murmur Resp: CTABL, no wheezes, non-labored Abd: Soft, mildly tender to palpation in the epigastric region, BS present, no guarding or organomegaly     Assessment & Plan:   Dyspepsia Patient symptoms are most consistent with acid reflux from her chronic dyspepsia.  Will prescribe Pepcid 40 mg daily and omeprazole 40 mg daily.  Patient was also advised to try Gaviscon for more immediate relief if she prefers.  We will also obtain H. pylori testing to rule out the presence of a bacterial infection causing a possible ulcer.  Patient would also benefit from a food journal to see if any foods exacerbate the issue and counseling on weight loss and avoidance of certain foods to reduce her symptoms.    Lezlie Octave, M.D. 02/08/2019, 8:40 AM PGY-2, [facility]

## 2019-02-07 LAB — H. PYLORI BREATH TEST: H pylori Breath Test: NEGATIVE

## 2019-02-08 NOTE — Assessment & Plan Note (Addendum)
Patient symptoms are most consistent with acid reflux from her chronic dyspepsia.  Will prescribe Pepcid 40 mg daily and omeprazole 40 mg daily.  Patient was also advised to try Gaviscon for more immediate relief if she prefers.  We will also obtain H. pylori testing to rule out the presence of a bacterial infection causing a possible ulcer.  Patient would also benefit from a food journal to see if any foods exacerbate the issue and counseling on weight loss and avoidance of certain foods to reduce her symptoms.

## 2019-05-09 ENCOUNTER — Ambulatory Visit: Payer: Medicaid Other | Admitting: Family Medicine

## 2019-05-10 ENCOUNTER — Other Ambulatory Visit: Payer: Self-pay

## 2019-05-10 ENCOUNTER — Encounter: Payer: Self-pay | Admitting: Family Medicine

## 2019-05-10 ENCOUNTER — Ambulatory Visit (INDEPENDENT_AMBULATORY_CARE_PROVIDER_SITE_OTHER): Payer: Self-pay | Admitting: Family Medicine

## 2019-05-10 VITALS — BP 124/82 | HR 99 | Ht 61.0 in | Wt 246.2 lb

## 2019-05-10 DIAGNOSIS — R1013 Epigastric pain: Secondary | ICD-10-CM

## 2019-05-10 DIAGNOSIS — R112 Nausea with vomiting, unspecified: Secondary | ICD-10-CM

## 2019-05-10 NOTE — Progress Notes (Signed)
  Subjective:   Patient ID: Deborah Chandler    DOB: 1981/01/27, 38 y.o. female   MRN: [id number]  Deborah Chandler is a 38 y.o. female with a history of GERD, uterine prolapse, chronic abdominal pain 2/2 dyspepsia, menorrhagia here for   Abdominal pain, vomiting - Previously seen 02/06/2019 for abdominal pain and vomiting clear liquid x1 month.  Was prescribed Pepcid 40 mg daily and omeprazole 40 mg daily.  Also recommended to try Gaviscon if needed.  H. pylori negative at that time - still having upper abdominal pain. Took prescribed meds for about 1 month but did not realize she had refills. Did report her medication helped calm her symptoms.  - symptoms: will eat normal meal then get bloated and nauseous with upper abdominal pain. Will sometimes vomit after meals with lingering burning epigastric pain. Denies blood in vomit, changes in bowel habits, lower abd pain, abnormal vaginal discharge, fevers.  - Has nexplanon in place.  - No FH of early cardiac disease - Is taking a medication for her foot injury she sustained last month, but is unsure the name of it.  Review of Systems:  Per HPI.  [place], medications and smoking status reviewed.  Objective:   BP 124/82   Pulse 99   Ht 5\' 1"  (1.549 m)   Wt 246 lb 4 oz (111.7 kg)   LMP 05/06/2019   SpO2 98%   BMI 46.53 kg/m  Vitals and nursing note reviewed.  General: Morbidly obese, in no acute distress with non-toxic appearance CV: regular rate and rhythm without murmurs, rubs, or gallops Lungs: clear to auscultation bilaterally with normal work of breathing Abdomen: soft, non-tender, obese abdomen, no masses or organomegaly palpable, normoactive bowel sounds.  Negative Murphy sign.  No CVA tenderness. Skin: warm, dry, no rashes or lesions Extremities: warm and well perfused, normal tone MSK: ROM grossly intact, strength intact, gait normal Neuro: Alert and oriented, speech normal  Assessment & Plan:   Dyspepsia Persistent.   Only took PPI and H2 blocker for 1 month though reported improvement in symptoms when she took them.  No red flags on history or exam, no weight loss per chart review and is continuing to tolerate liquids.  Afebrile with stable vitals today.  Still has gallbladder in place but without positive Murphy sign and with established history of gastritis, symptoms more consistent and likely related to gastritis.  Recommended continuation of PPI and H2 blocker for at least 8 weeks with follow-up with PCP at that time.  Advised on weight loss and keeping a food diary for evaluation of possible triggers.  Advised patient to avoid NSAID use given her recent foot injury.  If symptoms are still persistent without notable triggers on PPI and H2 blocker for 8 weeks, could consider referral to GI for repeat imaging at that time.  No orders of the defined types were placed in this encounter.  No orders of the defined types were placed in this encounter.  Rory Percy, DO PGY-3, [facility] 05/10/2019 5:04 PM

## 2019-05-10 NOTE — Assessment & Plan Note (Addendum)
Persistent.  Only took PPI and H2 blocker for 1 month though reported improvement in symptoms when she took them.  No red flags on history or exam, no weight loss per chart review and is continuing to tolerate liquids.  Afebrile with stable vitals today.  Still has gallbladder in place but without positive Murphy sign and with established history of gastritis, symptoms more consistent and likely related to gastritis.  Recommended continuation of PPI and H2 blocker for at least 8 weeks with follow-up with PCP at that time.  Advised on weight loss and keeping a food diary for evaluation of possible triggers.  Advised patient to avoid NSAID use given her recent foot injury.  If symptoms are still persistent without notable triggers on PPI and H2 blocker for 8 weeks, could consider referral to GI for repeat imaging at that time.

## 2019-05-10 NOTE — Patient Instructions (Signed)
It was great to see you!  Our plans for today:  - Continue to take the omeprazole and famotidine as directed. The refills are at the pharmacy. Take these for 8 weeks, if you are still having symptoms on the medication, we may consider sending you to the GI doctor. - Keep a food journal and document every time you vomit with the foods that you have eaten. This can help identify possible triggers. -Weight loss will be of great benefit to you and can help decrease the amount of intra-abdominal pressure placed on your stomach.  Take care and seek immediate care sooner if you develop any concerns.   Dr. [facility]

## 2019-07-05 ENCOUNTER — Encounter: Payer: Self-pay | Admitting: Occupational Therapy

## 2019-07-05 NOTE — Therapy (Signed)
[facility] [address] [place], [place], [address] Phone: [phone]   Fax:  [phone]  Patient Details  Name: Deborah Chandler MRN: [id number] Date of Birth: 1981-05-16 Referring Provider:  No ref. provider found  Encounter Date: 07/05/2019   OCCUPATIONAL THERAPY DISCHARGE SUMMARY  Visits from Start of Care: 3  Current functional level related to goals / functional outcomes: Pt goals not met due to pt not returning to occupational therapy.    Remaining deficits: Pain, decr strength, decr UE functional use   Education / Equipment: Pt instructed in initial HEP, basic activity modifications, and pain management strategies. Plan: Patient agrees to discharge.  Patient goals were not met. Patient is being discharged due to not returning since the last visit.  Therapy initially held due to Covid-19 closure; however, recommended pt follow-up with MD due to continued pain, to call for further therapy needs prn during closure, and office attempted to contact pt upon reopening. ?????         [facility] 07/05/2019, 10:42 AM  [facility] [address] [place], [place], [address] Phone: [phone]   Fax:  [place], OTR/L [facility] [address] [place], [place]  [address] [phone] phone [phone] 07/05/19 10:42 AM

## 2019-10-02 ENCOUNTER — Ambulatory Visit: Payer: Medicaid Other | Admitting: Family Medicine

## 2019-10-22 ENCOUNTER — Encounter: Payer: Self-pay | Admitting: Family Medicine

## 2019-10-22 ENCOUNTER — Other Ambulatory Visit: Payer: Self-pay

## 2019-10-22 ENCOUNTER — Ambulatory Visit (INDEPENDENT_AMBULATORY_CARE_PROVIDER_SITE_OTHER): Payer: Self-pay | Admitting: Family Medicine

## 2019-10-22 VITALS — BP 112/78 | HR 93 | Wt 247.6 lb

## 2019-10-22 DIAGNOSIS — N814 Uterovaginal prolapse, unspecified: Secondary | ICD-10-CM

## 2019-10-22 DIAGNOSIS — K219 Gastro-esophageal reflux disease without esophagitis: Secondary | ICD-10-CM

## 2019-10-22 DIAGNOSIS — N898 Other specified noninflammatory disorders of vagina: Secondary | ICD-10-CM

## 2019-10-22 LAB — POCT WET PREP (WET MOUNT)
Clue Cells Wet Prep Whiff POC: NEGATIVE
Trichomonas Wet Prep HPF POC: ABSENT

## 2019-10-22 MED ORDER — FAMOTIDINE 40 MG PO TABS: 40.0000 mg | ORAL_TABLET | Freq: Every day | ORAL | 11 refills | Status: DC

## 2019-10-22 NOTE — Patient Instructions (Addendum)
Por favor llame [facility]: [phone]  [facility]: [phone]

## 2019-10-22 NOTE — Progress Notes (Signed)
   CHIEF COMPLAINT / HPI: Vaginal mass, bitter taste in mouth  Spanish interpreter Darien Ramus [phone] present throughout exam  Vaginal mass Feels like there is a mass on the inside of the vagina that has been present for years.  She was told when she lived in [place] that she needed surgery for this.  She was referred to gynecology for this issue after seeing me in the past.  She saw them for a virtual visit and was told to come in for an in-person visit and did not realize that she was told that.  She reports dyspareunia as well as a change in vaginal odor as well.  She denies any changes in vaginal discharge.  She is monogamous with her husband.  Bitter taste in mouth/halitosis Patient reports that she has had a bitter taste in her mouth and some bad breath despite brushing her teeth 3 times per day.  She no longer takes Pepcid or omeprazole but says that she had the symptoms even while taking omeprazole.  She is unsure when she took Pepcid.  She does experience acid reflux symptoms occasionally.  Does say that she has occasional tooth pain and has not seen a dentist in quite a while.  PERTINENT  PMH / PSH: Uterine prolapse, acid reflux   OBJECTIVE: BP 112/78   Pulse 93   Wt 247 lb 9.6 oz (112.3 kg)   SpO2 98%   BMI 46.78 kg/m   General: Well-appearing, obese GU: Normal-appearing vulva, uterus seen in vagina upon inspection and palpated on digital exam.  Patient reports tenderness on palpation of uterus.  Normal-appearing cervix.  ASSESSMENT / PLAN:  Uterine prolapse Patient symptoms and exam are consistent with uterine prolapse, which was previously seen on my exam as well.  Told patient that gynecology had requested that she make an in person appointment with them so that they can further evaluate this, and gave her the number to their office to make that appointment.  Also placed another referral to ensure that she is seen.  Wet prep collected and was negative.  GERD (gastroesophageal  reflux disease) Patient's halitosis and better taste in her mouth likely due to acid reflux.  Prescribed Pepcid once daily.  She has been counseled in the past about the effect of weight loss on improving this condition as well.  Due to her sore teeth and lack of dental care recently, given telephone number for dental clinic with sliding scale fee.     Lennox Solders, MD [facility]

## 2019-10-23 NOTE — Assessment & Plan Note (Signed)
Patient's halitosis and better taste in her mouth likely due to acid reflux.  Prescribed Pepcid once daily.  She has been counseled in the past about the effect of weight loss on improving this condition as well.  Due to her sore teeth and lack of dental care recently, given telephone number for dental clinic with sliding scale fee.

## 2019-10-23 NOTE — Assessment & Plan Note (Addendum)
Patient symptoms and exam are consistent with uterine prolapse, which was previously seen on my exam as well.  Told patient that gynecology had requested that she make an in person appointment with them so that they can further evaluate this, and gave her the number to their office to make that appointment.  Also placed another referral to ensure that she is seen.  Wet prep collected and was negative.

## 2019-11-19 ENCOUNTER — Other Ambulatory Visit: Payer: Self-pay

## 2019-11-19 ENCOUNTER — Encounter: Payer: Self-pay | Admitting: Obstetrics and Gynecology

## 2019-11-19 ENCOUNTER — Ambulatory Visit (INDEPENDENT_AMBULATORY_CARE_PROVIDER_SITE_OTHER): Payer: Self-pay | Admitting: Obstetrics and Gynecology

## 2019-11-19 DIAGNOSIS — N813 Complete uterovaginal prolapse: Secondary | ICD-10-CM

## 2019-11-19 DIAGNOSIS — B9689 Other specified bacterial agents as the cause of diseases classified elsewhere: Secondary | ICD-10-CM

## 2019-11-19 DIAGNOSIS — N3020 Other chronic cystitis without hematuria: Secondary | ICD-10-CM | POA: Insufficient documentation

## 2019-11-19 DIAGNOSIS — R102 Pelvic and perineal pain: Secondary | ICD-10-CM

## 2019-11-19 MED ORDER — NITROFURANTOIN MONOHYD MACRO 100 MG PO CAPS: 100.0000 mg | ORAL_CAPSULE | Freq: Two times a day (BID) | ORAL | 0 refills | Status: AC

## 2019-11-19 MED ORDER — METRONIDAZOLE 500 MG PO TABS: 500.0000 mg | ORAL_TABLET | Freq: Two times a day (BID) | ORAL | 0 refills | Status: DC

## 2019-11-19 NOTE — Progress Notes (Signed)
Pt presents in referral from [facility] for ? Uterine prolapse. Pt reports she was Dx with a "bulge" in her vagina 5 yrs ago when in [place]. ? To have surgery but did not. She denies any bowel or bladder dysfunction. Does report some lower abd/pelvic pressure/pain at times She also reports pelvic pain and pain with intercourse. No cycle to occ spotting since Implanon placed at [facility]. Uncertain as to when it was placed Sexual active TSVD x 2 Pap smear UTD  PE AF VSS Lungs clear Heart RRR Abd soft + BS obese GU NL EGBUS, physiological discharge noted, cervix without lesion, small cystocele and rectocele, no uterine prolapse noted in supine position and with valsalva, uterosacral ligaments min attenuated,  + bladder tenderness, reproduces pt's pain, uterus small, mobile no adnexal masses or tenderness   A/P Chronic cystitis        Pelvic relaxation, cystocele/rectocele  Dx reviewed with pt. Will start Macrobid. Check GYN U/S. Will follow relaxation. No indication for surgery at this time d/t to not bowel or bladder dysfunction and at best a Grade 1.  F/U in 6 weeks Live interrupter used during today's visit

## 2019-11-23 ENCOUNTER — Ambulatory Visit (HOSPITAL_COMMUNITY): Payer: Self-pay

## 2019-12-03 ENCOUNTER — Ambulatory Visit (HOSPITAL_COMMUNITY)
Admission: RE | Admit: 2019-12-03 | Discharge: 2019-12-03 | Disposition: A | Discharge status: Home / Self Care | Payer: Medicaid Other | Source: Ambulatory Visit | Attending: Obstetrics and Gynecology | Admitting: Obstetrics and Gynecology

## 2019-12-03 ENCOUNTER — Other Ambulatory Visit: Payer: Self-pay

## 2019-12-03 DIAGNOSIS — R102 Pelvic and perineal pain: Secondary | ICD-10-CM

## 2019-12-11 ENCOUNTER — Telehealth (INDEPENDENT_AMBULATORY_CARE_PROVIDER_SITE_OTHER): Payer: Self-pay | Admitting: General Practice

## 2019-12-11 DIAGNOSIS — Z712 Person consulting for explanation of examination or test findings: Secondary | ICD-10-CM

## 2019-12-11 NOTE — Telephone Encounter (Signed)
-----   Message from Hermina Staggers, MD sent at 12/06/2019  5:01 PM EDT ----- Please let Ms Skoog that her U/S was normal. Keep her follow up appt.  Thanks Casimiro Needle

## 2019-12-11 NOTE — Telephone Encounter (Signed)
Called patient with pacific interpreter [phone] and informed her of results and discussed planned follow up on 4/27. Patient verbalized understanding & had no questions.

## 2019-12-20 ENCOUNTER — Ambulatory Visit (INDEPENDENT_AMBULATORY_CARE_PROVIDER_SITE_OTHER): Payer: Self-pay | Admitting: Family Medicine

## 2019-12-20 ENCOUNTER — Other Ambulatory Visit: Payer: Self-pay

## 2019-12-20 ENCOUNTER — Encounter: Payer: Self-pay | Admitting: Family Medicine

## 2019-12-20 VITALS — BP 138/74 | HR 72 | Ht 62.0 in | Wt 249.2 lb

## 2019-12-20 DIAGNOSIS — G8929 Other chronic pain: Secondary | ICD-10-CM

## 2019-12-20 DIAGNOSIS — M546 Pain in thoracic spine: Secondary | ICD-10-CM

## 2019-12-20 DIAGNOSIS — K5900 Constipation, unspecified: Secondary | ICD-10-CM | POA: Insufficient documentation

## 2019-12-20 DIAGNOSIS — R1013 Epigastric pain: Secondary | ICD-10-CM

## 2019-12-20 DIAGNOSIS — R7309 Other abnormal glucose: Secondary | ICD-10-CM | POA: Insufficient documentation

## 2019-12-20 LAB — POCT GLYCOSYLATED HEMOGLOBIN (HGB A1C): HbA1c, POC (controlled diabetic range): 6.6 % (ref 0.0–7.0)

## 2019-12-20 MED ORDER — POLYETHYLENE GLYCOL 3350 17 GM/SCOOP PO POWD: 17.0000 g | Freq: Every day | ORAL | 0 refills | Status: DC

## 2019-12-20 MED ORDER — ESOMEPRAZOLE MAGNESIUM 20 MG PO PACK: 20.0000 mg | PACK | Freq: Every day | ORAL | 12 refills | Status: DC

## 2019-12-20 NOTE — Assessment & Plan Note (Signed)
Obtained an A1c today due to patient's obesity and several elevated glucose measurements on BMPs in the past.  A1c was 6.6 today, which raises the suspicion for type 2 diabetes.  Would like to get a fasting BMP to confirm this diagnosis.  Patient was instructed on this and will make a lab appointment.  Future order was sent.

## 2019-12-20 NOTE — Assessment & Plan Note (Signed)
Patient has tried Protonix and Prilosec in the past, so we will try a course of Nexium to see if this may be helpful for her pain.  She has had a negative H. pylori test and negative ova and parasite exam as part of the work-up for her dyspepsia.

## 2019-12-20 NOTE — Progress Notes (Addendum)
SUBJECTIVE:   Virtual Spanish interpreter was present throughout visit  [place] / HPI:   BACK PAIN  Back pain began 3 days ago. Pain is described as located in upper left back and lower right side of back. Patient has tried a cream for pain relief, which is somewhat helpful Pain radiates no. History of trauma or injury: no Patient believes might be causing their pain: not sure  Prior history of similar pain: no History of cancer: no Weak immune system:  no History of IV drug use: no History of steroid use: no  Symptoms Incontinence of bowel or bladder:  no Numbness of leg: no Fever: no Rest or Night pain: no Weight Loss:  no Rash: no  Constipation Patient reports that she has had some issues with tenesmus.  She says that normally, she has to sit on the toilet for a long time before she has a bowel movement and will need to strain.  Occasionally, however, she will have times where she has to run to make it to the bathroom in time.  Stomach pain She notes that she has chronic stomach pain that is worse when she lies flat at night.  She currently takes famotidine for this.  ROS see HPI Smoking Status noted.  PERTINENT  PMH / PSH: Obesity, functional dyspepsia, chronic cystitis, menorrhagia, low back pain  OBJECTIVE:   BP 138/74   Pulse 72   Ht 5\' 2"  (1.575 m)   Wt 249 lb 3.2 oz (113 kg)   LMP 11/16/2019 (Approximate)   SpO2 98%   BMI 45.58 kg/m   General: well appearing, appears stated age 39: Bilateral tympanic membranes are clear, oropharynx is nonerythematous, no lymphadenopathy or thyromegaly Cardiac: RRR, no MRG Respiratory: CTAB, no rhonchi, rales, or wheezing, normal work of breathing Abdomen: Normal bowel sounds, soft, nontender, nondistended   Back: Normal skin, spine with normal alignment and no deformity.  No tenderness to vertebral process palpation.  Paraspinous muscles are mildly tender in the thoracic region bilaterally but without  spasm.   Range of motion is full at neck and lumbar sacral regions Skin: no rashes or other lesions, warm and well perfused Psych: appropriate mood and affect    ASSESSMENT/PLAN:   Abdominal pain, chronic, epigastric Patient has tried Protonix and Prilosec in the past, so we will try a course of Nexium to see if this may be helpful for her pain.  She has had a negative H. pylori test and negative ova and parasite exam as part of the work-up for her dyspepsia.  Other abnormal glucose Obtained an A1c today due to patient's obesity and several elevated glucose measurements on BMPs in the past.  A1c was 6.6 today, which raises the suspicion for type 2 diabetes.  Would like to get a fasting BMP to confirm this diagnosis.  Patient was instructed on this and will make a lab appointment.  Future order was sent.  Constipation Likely due to patient's low fiber diet and lack of exercise.  Patient was counseled on drinking water and eating foods with plenty of fiber such as fruit and vegetables as well as walking every day to help with her constipation.  She was also prescribed MiraLAX and advised to titrate up on this medication until she has 1 soft bowel movement per day.  Acute thoracic back pain Appears to be muscle strain.  No red flag symptoms and no tenderness to spinal process palpation.  Patient is already taking meloxicam, so I gave  her a handout in Spanish with back stretches and exercises to add on.  I reassured patient that this should resolve with time.     Lennox Solders, MD [facility]

## 2019-12-20 NOTE — Assessment & Plan Note (Signed)
Likely due to patient's low fiber diet and lack of exercise.  Patient was counseled on drinking water and eating foods with plenty of fiber such as fruit and vegetables as well as walking every day to help with her constipation.  She was also prescribed MiraLAX and advised to titrate up on this medication until she has 1 soft bowel movement per day.

## 2019-12-20 NOTE — Patient Instructions (Addendum)
Mucho gusto Sra. Belling!  Por favor toma Macrobid para su infeccion de [employer] por dia por siete dias y Ardelia Mems vez por dia hasta no hay mas.    Metronidazole para su infeccion de vagina.  [employer] veces cada dia.  Meloxicam para su pie una vez cada dia.  Gabapentin para su pie 1-3 vezes cada dia si necesario.  Miralax para su estrenimiento cualquier cantidad as necesario.  Nexium para su estomago.  Ten cuidado, Dr. Shan Levans  Ejercicios para la espalda Back Exercises Estos ejercicios ayudan a fortalecer el tronco y la espalda. adems, ayudan a mantener la flexibilidad de la zona lumbar. Hacer estos ejercicios puede ser de ayuda para evitar o Best boy de espalda.  Si tiene dolor de espalda, trate de Dispensing optician ejercicios 2 o 3veces por da, o como se lo haya indicado el mdico.  A medida que [place], haga los ejercicios una vez por da. Repita los ejercicios con ms frecuencia como se lo haya indicado el mdico.  Para evitar que el dolor regrese, haga los ejercicios una vez por da o como se lo haya indicado el mdico. Ejercicios Rodilla al pecho Repita estos pasos 3 o 5veces seguidas con cada pierna: 1. Acustese boca arriba sobre una cama dura o sobre el suelo con las piernas extendidas. 2. Lleve una rodilla al pecho. 3. Agarre la rodilla o el muslo con ambas manos y sostngalo en su lugar. 4. Tire de la rodilla hasta sentir una elongacin suave en la parte baja de la espalda o las nalgas. 5. Mantenga la elongacin durante 10 a 30segundos. 6. Suelte y extienda la pierna lentamente. Inclinacin de la pelvis Repita estos pasos 5 o 10veces seguidas: 1. Acustese boca arriba sobre una cama dura o sobre el suelo con las piernas extendidas. 2. Flexione las rodillas de manera que apunten al techo. Los pies deben estar apoyados en el suelo. 3. Contraiga los msculos de la parte baja del vientre (abdomen) para empujar la zona lumbar contra el suelo. Este  movimiento har que el coxis apunte hacia el techo, en lugar de apuntar hacia abajo en direccin a los pies o al suelo. 4. Mantenga esta posicin durante 5 a 10segundos mientras contrae suavemente los msculos y respira con normalidad. El perro y el gato Repita estos pasos hasta que la zona lumbar se curve con ms facilidad: 1. [place] manos y las rodillas sobre una superficie firme. Las manos deben estar alineadas con los hombros y las rodillas con las caderas. Puede colocarse almohadillas debajo de las rodillas. 2. Deje que la cabeza cuelgue hacia el pecho. Tense (contraiga) los msculos del vientre. Baje el coxis en direccin al suelo de modo que la zona lumbar se arquee como el lomo de un [place]. 3. Mantenga esta posicin durante 5segundos. 4. [place] lentamente la cabeza. Relaje los msculos del vientre. Eleve el coxis de modo que apunte en direccin al techo para que la espalda forme un arco hundido como el lomo de un perro contento. 5. Mantenga esta posicin durante 5segundos.  Flexiones de brazos Repita estos pasos 5 o 10veces seguidas: 1. Acustese boca abajo en el suelo. [place] manos cerca de la cabeza, separadas aproximadamente al ancho de los hombros. 3. Con la espalda relajada y las caderas apoyadas en el suelo, extienda lentamente los brazos para levantar la mitad superior del cuerpo y Community education officer los hombros. No use los msculos de la espalda. Puede cambiar la ubicacin de las  manos para estar ms cmodo. 4. Mantenga esta posicin durante 5segundos. 5. Lentamente vuelva a la posicin horizontal.  Puentes Repita estos pasos 10veces seguidas: 1. Acustese boca arriba sobre una superficie firme. 2. Flexione las rodillas de manera que apunten al techo. Los pies deben estar apoyados en el suelo. Los brazos deben estar paralelos a los costados del cuerpo, cerca del cuerpo. 3. Contraiga los glteos y despegue las nalgas del suelo hasta que la cintura est  casi a la altura de las rodillas. Si no siente el trabajo muscular en las nalgas y la parte posterior de los muslos, aleje los pies 1 o 2pulgadas (2.5 o 5centmetros) de las nalgas. 4. Mantenga esta posicin durante 3 a 5segundos. 5. Lentamente, vuelva a apoyar las nalgas en el suelo y relaje los glteos. Si este ejercicio le resulta muy fcil, intente realizarlo con los brazos cruzados [place]. Abdominales Repita estos pasos 5 o 10veces seguidas: 1. Acustese boca arriba sobre una cama dura o sobre el suelo con las piernas extendidas. 2. Flexione las rodillas de manera que apunten al techo. Los pies deben estar apoyados en el suelo. 3. Cruce los [employer]. 4. Baje levemente el mentn en direccin al pecho, pero no doble el cuello. 5. Contraiga los msculos del abdomen y con lentitud eleve el pecho lo suficiente como para despegar levemente los omplatos del suelo. Evite levantar el cuerpo ms alto que eso, porque puede sobreexigir la zona lumbar. 6. Lentamente baje el pecho y la cabeza hasta el suelo. Elevaciones de espalda Repita estos pasos 5 o 10veces seguidas: 1. Acustese boca abajo con los brazos a los costados y apoye la frente en el suelo. 2. Contraiga los msculos de las piernas y los glteos. 3. Lentamente despegue el pecho del suelo mientras mantiene las caderas apoyadas en el suelo. Mantenga la nuca alineada con la curvatura de la espalda. Mire hacia el suelo mientras hace este ejercicio. 4. Mantenga esta posicin durante 3 a 5segundos. 5. Lentamente baje el pecho y el rostro hasta el suelo. Comunquese con un mdico si:  El dolor de espalda se vuelve mucho ms intenso cuando hace un ejercicio.  El dolor de espalda no mejora 2horas despus de [employer] ejercicios. Si tiene alguno de [employer], deje de [employer] ejercicios. No vuelva a hacer los ejercicios a menos que el mdico lo autorice. [place] ayuda inmediatamente si:  Siente un dolor  sbito y muy intenso en la espalda. Si esto ocurre, deje de [employer]. No vuelva a hacer los ejercicios a menos que el mdico lo autorice. Esta informacin no tiene Theme park manager el consejo del mdico. Asegrese de hacerle al mdico cualquier pregunta que tenga. Document Revised: 06/22/2018 Document Reviewed: 06/22/2018 Elsevier Patient Education  2020 [employer].

## 2019-12-20 NOTE — Assessment & Plan Note (Signed)
Appears to be muscle strain.  No red flag symptoms and no tenderness to spinal process palpation.  Patient is already taking meloxicam, so I gave her a handout in Spanish with back stretches and exercises to add on.  I reassured patient that this should resolve with time.

## 2019-12-25 ENCOUNTER — Other Ambulatory Visit: Payer: Medicaid Other

## 2019-12-26 ENCOUNTER — Other Ambulatory Visit: Payer: Medicaid Other

## 2019-12-26 ENCOUNTER — Other Ambulatory Visit: Payer: Self-pay

## 2019-12-26 DIAGNOSIS — R7309 Other abnormal glucose: Secondary | ICD-10-CM

## 2019-12-27 LAB — BASIC METABOLIC PANEL
BUN/Creatinine Ratio: 15 (ref 9–23)
BUN: 11 mg/dL (ref 6–20)
CO2: 22 mmol/L (ref 20–29)
Calcium: 9 mg/dL (ref 8.7–10.2)
Chloride: 103 mmol/L (ref 96–106)
Creatinine, Ser: 0.74 mg/dL (ref 0.57–1.00)
GFR calc Af Amer: 120 mL/min/{1.73_m2} (ref >59)
GFR calc non Af Amer: 104 mL/min/{1.73_m2} (ref >59)
Glucose: 105 mg/dL — ABNORMAL HIGH (ref 65–99)
Potassium: 4.6 mmol/L (ref 3.5–5.2)
Sodium: 140 mmol/L (ref 134–144)

## 2019-12-28 ENCOUNTER — Encounter: Payer: Self-pay | Admitting: Family Medicine

## 2020-01-01 ENCOUNTER — Ambulatory Visit (INDEPENDENT_AMBULATORY_CARE_PROVIDER_SITE_OTHER): Payer: Self-pay | Admitting: Obstetrics and Gynecology

## 2020-01-01 ENCOUNTER — Encounter: Payer: Self-pay | Admitting: Obstetrics and Gynecology

## 2020-01-01 ENCOUNTER — Other Ambulatory Visit: Payer: Self-pay

## 2020-01-01 VITALS — BP 132/96 | HR 75 | Wt 250.0 lb

## 2020-01-01 DIAGNOSIS — R102 Pelvic and perineal pain: Secondary | ICD-10-CM

## 2020-01-01 DIAGNOSIS — N898 Other specified noninflammatory disorders of vagina: Secondary | ICD-10-CM

## 2020-01-01 DIAGNOSIS — N3020 Other chronic cystitis without hematuria: Secondary | ICD-10-CM

## 2020-01-01 DIAGNOSIS — Z1331 Encounter for screening for depression: Secondary | ICD-10-CM

## 2020-01-01 MED ORDER — NITROFURANTOIN MONOHYD MACRO 100 MG PO CAPS: 100.0000 mg | ORAL_CAPSULE | Freq: Two times a day (BID) | ORAL | 0 refills | Status: DC

## 2020-01-01 MED ORDER — CLINDAMYCIN HCL 300 MG PO CAPS: 300.0000 mg | ORAL_CAPSULE | Freq: Two times a day (BID) | ORAL | 0 refills | Status: DC

## 2020-01-01 NOTE — Progress Notes (Signed)
Here for follow up. See prior office noted. Pt did not take Macrobid. Was unable to tolerate the Flagyl. GYN U/S results reviewed with pt.  PE  AF VSS Lungs clear Heart RRR Abd soft + BS obese  A/P Chronic cystitis        Vaginal discharge  Reviewed importance of taking medication and if she has problems either getting medication or taking to contact the office. Rx for Cleocin and Macrobid sent to pt's pharmacy. Live interrupter used during today's visit and all medications reviewed. F/U in 6 weeks

## 2020-01-01 NOTE — Patient Instructions (Signed)
[place] [facility]  Department of Social [place] [address], [place], [place] [address] [phone]) [phone]   or  www.[place].gov/[place]-county/human-services/social-services **SNAP/EBT/ Other nutritional benefits  [place] DHHS-Public Health-WIC [address], [place], [place] [address] [phone]) [phone]  or  https://[place].gov/[place]-county/human-services/health-department **WIC for  women who are pregnant and postpartum, infants and children up to 5 years old  [facility] [address], [place], [place] [address] [phone]) [phone]   or   www.theblessedtable.org  **Food pantry  [facility] [address], [place], [place] [address] [phone]) [phone]   or   https://brotherkolbes.godaddysites.com  **Emergency food and prepared meals  [facility] [address], [place], [place] [address] [phone]) [phone]   or   www.cedargrovetop.us **Food pantry  [facility] [address], [place], [place] [address] [phone]) [phone]   or   www.facebook.com/[facility]-116430931718202 **Food pantry  [facility] [address], [place], [place] [address] [phone]) [phone] **Food pantry  [facility] [address], [place], [place] [address] [phone]) [phone]   or   www.greensborourbanministry.org  **Food pantry and prepared meals  [facility] [address], [place], [place] [address] https://jfsgreensboro.org/  **Food pantry  [facility] [address], [place], [place] [address] [phone]) [phone]   or   www.lbcnow.org  **Food pantry  [facility] [address], [place], [place] [address] [phone]) [phone]   or   http://www.onestepfurther.com **Food pantry, nutrition education, gardening activities  [facility] [address], [place], [place] [address] [phone]) [phone] **Food pantry  [facility] [place] [address], [place], [place] [address] [phone]) [phone]   or   www.salvationarmyofgreensboro.org **Food pantry  [facility] [place] [address], [place], [place] [address] [phone]) [phone]   or   http://senior-resources-[place].org **Meals on Wheels Program  [facility] [address], [place], [place] [address] [phone]) [phone]   or   www.stmattchurch.com  **Food pantry  [facility] [address], [place], [place] [address] [phone]   or   vandaliapresbyterianchurch.org **Food pantry  [place] [facility]  [facility] [address], [place], [place] [address] [phone]) [phone]   or   www.facebook.com/[facility]ianUMC Food pantry  [facility] [address], [place], [place] [address] [phone]) [phone] **Food pantry  [place] [facility]   Department of [place] [address], [place], [place] [address] [phone]) [phone]   or   www.co..[place].us/ph/  [place] DHHS-Public Health-WIC ([place]) [address], [place], [place] [address] ([phone]) [phone]   or   https://www.[place]nc.gov/our-county/human-services/health-department **WIC for pregnant and postpartum women, infants and children up to 5 years old  [facility] [address], [place], [place] [address] [phone]) [phone] **Food pantry  [facility] [address], [place], [place] [address] [phone]) [phone]   or   emerywoodbaptistchurch.com *Food pantry  [facility] [address], [place], [place] [address] [phone]) [phone]   or   www.fcch[place].org **Food pantry  [facility] [address], [place],  [address] [phone]) [phone]   or    www.helpinghandshp.org **Food pantry  [facility] [address], [place],  [address] [phone]) [phone]   or   www.facebook.com/KBCI1 **Food Pantry  [facility] [address], [place], [place] [address] [phone]) [phone]   or   www.abbottscreek.org **Food pantry  [facility] [place] ([phone])[phone]   **Delivers meals  [facility] [address], [place], [place] [address] [phone]) [phone]   or   nbfgm.sundaystreamwebsites.com  **Food pantry  [facility] [place] [address], [place], [place] [address] [phone]) [phone]   or   www.odm-hp.org  **Food pantry  [facility] [address], [place], [place] [address] [phone]) [phone]   or   R2live.tv **Food pantry  [facility] [address], [place], [place] [address] [phone]) [phone]   or   www.salvationarmycarolinas.org/[place] **Emergency food and pet food  [facility] [address], [place], [place] [address] [phone])[phone]   or   www.senioradults.org **Congregate and delivered meals to older adults  [facility] [place] [address], [place], [place] [address] ([phone]) [phone]   or   www.unitedwayhp.org **Back Pack Program for elementary school students  [facility] [address], [place], [place] [address] ([phone]) [phone]   or   www.wardstreetcommunityresources.org **Food pantry  [facility] [address], [place], [place] [address] ([phone]) [phone]   or   www.ywcahp.com **Emergency food, nutrition classes, food budgeting  [facility] [place]  Department [place]  [address], [place], [place] [address] [phone]) [phone]  or   www.co.[place].us/pview.aspx?id=14850&catid=407 **SNAP/Other nutrition benefits  [place] Department of Health and Human Services [address], [place], [place] [address] [phone])[phone]   or  https://www.rockinghamcountydhhs.org/  **SNAP/Other nutrition benefits  [place] Department of Public Health-WIC & Nutrition Services [address], [place], [place] [address] [phone]) [phone]  or  http://www.rockinghamcountypublichealth.org **WIC for pregnant and postpartum women, infants and children up to 5 years old  Aging, Disability and Transit Services[place] [address], [place], [place] [address] [phone]) [phone]  or www.adtsrc.org **Prepared meals for older adults  [facility] [address], [place], [place] [address] [phone]) [phone]  or  www.calvary4you.com **Food pantry  [facility] [address], [place], [place] [address] [phone]) [phone]   or   https://www.handsofgod.org/  **Food pantry  [facility] [address], [place], [place] [address] [phone]) [phone] **Food pantry  [facility] [address], [place], [place] [address] [phone]) [phone]   or   www.ci.[place]..us/government/parks_and_recreation/senior_center/index.php **Congregate meal for older adults  [facility] [address], [place],  [address] [phone]) [phone]   or www.reidsvilleoutreachcenter.org  **Food pantry  [facility] [address], [place],  [address] [phone]) [phone]   or   www.salvationarmycarolinas.org/commands/[place] **Food pantry    

## 2020-01-10 ENCOUNTER — Telehealth (INDEPENDENT_AMBULATORY_CARE_PROVIDER_SITE_OTHER): Payer: Self-pay | Admitting: Obstetrics and Gynecology

## 2020-01-10 DIAGNOSIS — R110 Nausea: Secondary | ICD-10-CM

## 2020-01-10 DIAGNOSIS — R519 Headache, unspecified: Secondary | ICD-10-CM

## 2020-01-10 DIAGNOSIS — T887XXA Unspecified adverse effect of drug or medicament, initial encounter: Secondary | ICD-10-CM

## 2020-01-10 NOTE — Telephone Encounter (Signed)
Patient stated her medication was making her sick, so she is not taking it. She is requesting a call back from the nurse.

## 2020-01-14 NOTE — BH Specialist Note (Addendum)
Integrated Behavioral Health via Telemedicine Video Visit  01/14/2020 Lynsie Mcwatters [id number]  Number of Integrated Behavioral Health visits: 1 Session Start time: 10:16  Session End time: 11:09 Total time: 54  Referring Provider: Nettie Elm, MD Type of Visit: Video Patient/Family location: Home [facility] Provider location: Center for [facility]  All persons participating in visit: Patient Deborah Chandler and [facility] Hulda Marin and Spanish-speaking interpreter Derek Mound, [id number]   Confirmed patient's address: Yes  Confirmed patient's phone number: Yes  Any changes to demographics: No   Confirmed patient's insurance: Yes  Any changes to patient's insurance: No   Discussed confidentiality: Yes   I connected with [name]  by a video enabled telemedicine application and verified that I am speaking with the correct person using two identifiers.     I discussed the limitations of evaluation and management by telemedicine and the availability of in person appointments.  I discussed that the purpose of this visit is to provide behavioral health care while limiting exposure to the novel coronavirus.   Discussed there is a possibility of technology failure and discussed alternative modes of communication if that failure occurs.  I discussed that engaging in this video visit, they consent to the provision of behavioral healthcare and the services will be billed under their insurance.  Patient and/or legal guardian expressed understanding and consented to video visit: Yes   PRESENTING CONCERNS: Patient and/or family reports the following symptoms/concerns: Pt states her primary symptoms are fatigue and feeling unmotivated after losing her job, and feeling nausea and headaches after starting new medications (Macrobid and Cleocin); pt open to referrals via [id number] for employment agencies.  Duration of problem: After recent job  loss; Severity of problem: moderate  STRENGTHS (Protective Factors/Coping Skills): Supportive family  GOALS ADDRESSED: Patient will: 1.  Reduce symptoms of: depression and stress  2.  Increase knowledge and/or ability of: stress reduction  3.  Demonstrate ability to: Increase healthy adjustment to current life circumstances  INTERVENTIONS: Interventions utilized:  Solution-Focused Strategies Standardized Assessments completed: PHQ9/GAD7 given in past two weeks  ASSESSMENT: Patient currently experiencing Adjustment disorder with depressed mood and Psychosocial stress  Patient may benefit from psychoeducation and brief therapeutic interventions regarding coping with symptoms of depression and life stress .  PLAN: 1. Follow up with behavioral health clinician on : Two weeks 2. Behavioral recommendations:  -Sign [id number] consent sent to phone -Accept referrals to employment agencies -Make sure voicemail is set up and not full to receive calls from referral agencies -Expect a nurse call-back today to discuss concerns with medication side effects 3. Referral(s): [facility] [employer] (In Clinic) and [facility]:  Employment  I discussed the assessment and treatment plan with the patient and/or parent/guardian. They were provided an opportunity to ask questions and all were answered. They agreed with the plan and demonstrated an understanding of the instructions.   They were advised to call back or seek an in-person evaluation if the symptoms worsen or if the condition fails to improve as anticipated.  [facility]  Depression screen [facility] 2/9 01/01/2020 10/22/2019 05/10/2019 02/06/2019 10/25/2018  Decreased Interest 2 0 0 0 0  Down, Depressed, Hopeless 2 0 0 0 0  PHQ - 2 Score 4 0 0 0 0  Altered sleeping 2 - - - -  Tired, decreased energy 3 - - - -  Change in appetite 0 - - - -  Feeling bad or failure about yourself  3 - - - -  Trouble concentrating 2 - - - -   Moving slowly or fidgety/restless 0 - - - -  Suicidal thoughts 0 - - - -  PHQ-9 Score 14 - - - -   GAD 7 : Generalized Anxiety Score 01/01/2020 08/25/2016  Nervous, Anxious, on Edge 0 0  Control/stop worrying 1 0  Worry too much - different things 1 0  Trouble relaxing 0 0  Restless 0 0  Easily annoyed or irritable 0 0  Afraid - awful might happen 2 0  Total GAD 7 Score 4 0

## 2020-01-15 ENCOUNTER — Ambulatory Visit (INDEPENDENT_AMBULATORY_CARE_PROVIDER_SITE_OTHER): Payer: Self-pay | Admitting: Clinical

## 2020-01-15 DIAGNOSIS — F4321 Adjustment disorder with depressed mood: Secondary | ICD-10-CM

## 2020-01-15 DIAGNOSIS — Z658 Other specified problems related to psychosocial circumstances: Secondary | ICD-10-CM

## 2020-01-15 DIAGNOSIS — Z1331 Encounter for screening for depression: Secondary | ICD-10-CM

## 2020-01-15 MED ORDER — ONDANSETRON HCL 8 MG PO TABS: 8.0000 mg | ORAL_TABLET | Freq: Three times a day (TID) | ORAL | 1 refills | Status: DC | PRN

## 2020-01-15 NOTE — Addendum Note (Signed)
Addended by: Kathee Delton on: 01/15/2020 12:17 PM   Modules accepted: Orders

## 2020-01-15 NOTE — Patient Instructions (Signed)
[place] [facility]  Department of [place] [address], [place], [place] [address] [phone]) [phone]   or  www.[place].gov/[place]/human-services/social-services **SNAP/EBT/ Other nutritional benefits  [place] DHHS-Public Health-WIC [address], [place], [place] [address] [phone]) [phone]  or  https://[place].gov/[place]-c[place]/human-services/health-department **WIC for  women who are pregnant and postpartum, infants and children up to 5 years old  [facility] [address], [place], [place] [address] [phone]) [phone]   or   www.theblessedtable.org  **Food pantry  [facility] [address], [place], [place] [address] [phone]   or   https://brotherkolbes.godaddysites.com  **Emergency food and prepared meals  [place] [facility] [address], [place], [place] [address] [phone]) [phone]   or   www.cedargrovetop.us **Food pantry  [facility] [address], [place], [place] [address] [phone]) [phone]   or   www.facebook.com/[facility]-116430931718202 **Food pantry  [facility] [address], [place], [place] [address] [phone]) [phone] **Food pantry  [facility] [address], [place], [place] [address] [phone]) [phone]   or   www.greensborourbanministry.org  **Food pantry and prepared meals  [facility] [address], [place], [place] [address] https://jfsgreensboro.org/  **Food pantry  [facility] [address], [place], [place] [address] [phone]) [phone]   or   www.lbcnow.org  **Food pantry  [facility] [address], [place], [place] [address] [phone]) [phone]   or   http://www.onestepfurther.com **Food pantry, nutrition education, gardening activities  [facility] [address], [place], [place] [address] [phone]) [phone] **Food pantry  [facility] [place] [address], [place], [place] [address] [phone]) [phone]   or   www.salvationarmyofgreensboro.org **Food pantry  [facility] [place] [address], [place], [place] [address] [phone]) [phone]   or   http://senior-resources-[place].org **Meals on Wheels Program  [facility] [address], [place], [place] [address] [phone]) [phone]   or   www.stmattchurch.com  **Food pantry  [facility] [address], [place],  [address] [phone]   or   vandaliapresbyterianchurch.org **Food pantry     

## 2020-01-15 NOTE — Telephone Encounter (Signed)
Called patient with Deborah Chandler for interpreter stating I am returning her phone call. Patient states she has been experiencing nausea, heartburn, & headache 5-10 minutes after taking the antibiotics and it lasts all day. Patient states she hasn't taken the medication since 5/2 and hasn't had symptoms since then. Per Dr Alysia Penna, patient can have zofran 8mg  TID prn #15 with 1 refill. Informed patient and discussed taking medication 30 minutes before taking antibiotics. Patient states she will try that and if it doesn't work she will call back.

## 2020-01-22 ENCOUNTER — Telehealth: Payer: Self-pay | Admitting: Clinical

## 2020-01-22 NOTE — Telephone Encounter (Signed)
Left HIPPA-compliant message, through [facility] Spanish interpreter [place], [id number] to call back Asher Muir from Center for [employer] at [facility] for Women at [phone] (main office) or [phone] (Deaisa Merida's office).

## 2020-01-24 NOTE — BH Specialist Note (Signed)
Integrated Behavioral Health via Telemedicine Phone Visit  01/24/2020 Deborah Chandler [id number]  Number of Integrated Behavioral Health visits: 2 Session Start time: 3:47-3:54(phone cut off); then 4:00 Session End time: 4:08 Total time: 15  Referring Provider: Nettie Elm, MD Type of Visit: Phone Patient/Family location: Home [facility] Provider location: [facility] for [facility]  All persons participating in visit: Patient Deborah Chandler and [facility] Hulda Marin; Spanish interpreters Christiane Ha [id number], then Margurite Auerbach [id number]   Confirmed patient's address: Yes  Confirmed patient's phone number: Yes  Any changes to demographics: No   Confirmed patient's insurance: Yes  Any changes to patient's insurance: No   Discussed confidentiality: at previous visit  I connected with [name] by a video enabled telemedicine application and verified that I am speaking with the correct person using two identifiers.     I discussed the limitations of evaluation and management by telemedicine and the availability of in person appointments.  I discussed that the purpose of this visit is to provide behavioral health care while limiting exposure to the novel coronavirus.   Discussed there is a possibility of technology failure and discussed alternative modes of communication if that failure occurs.  I discussed that engaging in this video visit, they consent to the provision of behavioral healthcare and the services will be billed under their insurance.  Patient and/or legal guardian expressed understanding and consented to video visit: Yes   PRESENTING CONCERNS: Patient and/or family reports the following symptoms/concerns: Pt states she is still feeling tired and unmotivated, attributed to being unemployed after job loss, has not heard back from referral agencies (via [id number]), started taking Zofran last night with no known side effects. Pt has  no other concern; is open to setting up MyChart.  Duration of problem: Since recent job loss; Severity of problem: moderate  STRENGTHS (Protective Factors/Coping Skills): Supportive family   GOALS ADDRESSED: Patient will: 1.  Reduce symptoms of: depression and stress  2.  Demonstrate ability to: Increase healthy adjustment to current life circumstances  INTERVENTIONS: Interventions utilized:  Supportive Counseling and Link to [facility] Standardized Assessments completed: Not Needed  ASSESSMENT: Patient currently experiencing Adjustment disorder with depressed mood and Psychosocial stress.   Patient may benefit from continued psychoeducation and brief therapeutic interventions regarding coping with symptoms of depression and life stress .  PLAN: 1. Follow up with behavioral health clinician on : As needed 2. Behavioral recommendations:  -Use code sent via text today to set up MyChart; Contact MyChart help desk at [phone] for help setting up MyChart, if needed -Continue to accept referrals from potential employment agencies via [id number] -Continue to take all medications as prescribed by medical provider and plan to attend upcoming medical appointment on 02/13/20 at 8:35am  3. Referral(s): [facility] [employer] (In Clinic) and [facility]:  employment  I discussed the assessment and treatment plan with the patient and/or parent/guardian. They were provided an opportunity to ask questions and all were answered. They agreed with the plan and demonstrated an understanding of the instructions.   They were advised to call back or seek an in-person evaluation if the symptoms worsen or if the condition fails to improve as anticipated.  [facility]  Depression screen [facility] 2/9 01/01/2020 10/22/2019 05/10/2019 02/06/2019 10/25/2018  Decreased Interest 2 0 0 0 0  Down, Depressed, Hopeless 2 0 0 0 0  PHQ - 2 Score 4 0 0 0 0  Altered sleeping 2 - - - -  Tired, decreased energy 3 - - - -  Change in appetite 0 - - - -  Feeling bad or failure about yourself  3 - - - -  Trouble concentrating 2 - - - -  Moving slowly or fidgety/restless 0 - - - -  Suicidal thoughts 0 - - - -  PHQ-9 Score 14 - - - -   GAD 7 : Generalized Anxiety Score 01/01/2020 08/25/2016  Nervous, Anxious, on Edge 0 0  Control/stop worrying 1 0  Worry too much - different things 1 0  Trouble relaxing 0 0  Restless 0 0  Easily annoyed or irritable 0 0  Afraid - awful might happen 2 0  Total GAD 7 Score 4 0

## 2020-01-28 ENCOUNTER — Telehealth: Payer: Self-pay | Admitting: Clinical

## 2020-01-28 NOTE — Telephone Encounter (Signed)
[employer]ia [employer] Spanish interpreter Marcello Moores, # [id number], returned pt call: pt requesting help with obtaining medication, says Zofran is $75. Pt gave verbal permission to speak to her daughter, who called pharmacy, and found that through GoodRx coupon, Zofran will be $16.08. Pt and pt's daughter are encouraged to go to the GoodRx.com site to compare costs of future medication with coupons and various memberships. Pt's daughter says that yes, they will pick up Zofran, and that her mother will begin taking.

## 2020-01-29 ENCOUNTER — Ambulatory Visit (INDEPENDENT_AMBULATORY_CARE_PROVIDER_SITE_OTHER): Payer: Self-pay | Admitting: Clinical

## 2020-01-29 ENCOUNTER — Other Ambulatory Visit: Payer: Self-pay

## 2020-01-29 DIAGNOSIS — Z658 Other specified problems related to psychosocial circumstances: Secondary | ICD-10-CM

## 2020-01-29 DIAGNOSIS — F4321 Adjustment disorder with depressed mood: Secondary | ICD-10-CM

## 2020-02-13 ENCOUNTER — Other Ambulatory Visit: Payer: Self-pay

## 2020-02-13 ENCOUNTER — Encounter: Payer: Self-pay | Admitting: Obstetrics and Gynecology

## 2020-02-13 ENCOUNTER — Ambulatory Visit (INDEPENDENT_AMBULATORY_CARE_PROVIDER_SITE_OTHER): Payer: Self-pay | Admitting: Obstetrics and Gynecology

## 2020-02-13 ENCOUNTER — Telehealth: Payer: Self-pay | Admitting: Family Medicine

## 2020-02-13 VITALS — BP 123/85 | HR 101 | Ht 62.0 in | Wt 253.4 lb

## 2020-02-13 DIAGNOSIS — N3020 Other chronic cystitis without hematuria: Secondary | ICD-10-CM

## 2020-02-13 MED ORDER — CEPHALEXIN 500 MG PO CAPS: 500.0000 mg | ORAL_CAPSULE | Freq: Two times a day (BID) | ORAL | 0 refills | Status: DC

## 2020-02-13 NOTE — Progress Notes (Signed)
Deborah Chandler presents for follow up of her chronic cystitis. Reports was unable to tolerated the Macrobid d/t to N/V.  PE AF VSS Lungs clear Heart RRR Abd soft + BS, obese GU NL EGBUS + Bladder tenderness, cervix no lesion, no discharge  A/P Chronic Cystitis  Will try Keflex 500 mg po bid x 14 days F/U in 6 weeks Live interrupter was used during today's visit

## 2020-02-13 NOTE — Telephone Encounter (Signed)
Deborah Chandler DOB: September 12, 1980 MRN: [id number]   RIDER WAIVER AND RELEASE OF LIABILITY  For purposes of improving physical access to our facilities, [facility] is pleased to partner with third parties to provide [facility] patients or other authorized individuals the option of convenient, on-demand ground transportation services (the Chiropractor") through use of the technology service that enables users to request on-demand ground transportation from independent third-party providers.  By opting to use and accept these [employer], I, the undersigned, hereby agree on behalf of myself, and on behalf of any minor child using the [employer] for whom I am the parent or legal guardian, as follows:  1. Science writer provided to me are provided by independent third-party transportation providers who are not [employer] or employees and who are unaffiliated with [employer]. 2. [facility] is neither a transportation carrier nor a common or public carrier. 3. [place] has no control over the quality or safety of the transportation that occurs as a result of the [employer]. 4. [facility] cannot guarantee that any third-party transportation provider will complete any arranged transportation service. 5. [facility] makes no representation, warranty, or guarantee regarding the reliability, timeliness, quality, safety, suitability, or availability of any of the Transport Services or that they will be error free. 6. I fully understand that traveling by vehicle involves risks and dangers of serious bodily injury, including permanent disability, paralysis, and death. I agree, on behalf of myself and on behalf of any minor child using the Transport Services for whom I am the parent or legal guardian, that the entire risk arising out of my use of the [employer] remains solely with me, to the maximum extent permitted under applicable law. 7. The  [employer] are provided "as is" and "as available." [employer] disclaims all representations and warranties, express, implied or statutory, not expressly set out in these terms, including the implied warranties of merchantability and fitness for a particular purpose. 8. I hereby waive and release , its agents, employees, officers, directors, representatives, insurers, attorneys, assigns, successors, subsidiaries, and affiliates from any and all past, present, or future claims, demands, liabilities, actions, causes of action, or suits of any kind directly or indirectly arising from acceptance and use of the [employer]. 9. I further waive and release  and its affiliates from all present and future liability and responsibility for any injury or death to persons or damages to property caused by or related to the use of the [employer]. 10. I have read this Waiver and Release of Liability, and I understand the terms used in it and their legal significance. This Waiver is freely and voluntarily given with the understanding that my right (as well as the right of any minor child for whom I am the parent or legal guardian using the [employer]) to legal recourse against  in connection with the [employer] is knowingly surrendered in return for use of these services.   I attest that I read the consent document to Deborah Chandler, gave Deborah Chandler the opportunity to ask questions and answered the questions asked (if any). I affirm that Deborah Chandler then provided consent for she's participation in this program.     Launa Grill, waiver form was read to patient in spanish.

## 2020-03-25 ENCOUNTER — Ambulatory Visit (INDEPENDENT_AMBULATORY_CARE_PROVIDER_SITE_OTHER): Payer: Self-pay | Admitting: Family Medicine

## 2020-03-25 ENCOUNTER — Other Ambulatory Visit: Payer: Self-pay

## 2020-03-25 ENCOUNTER — Other Ambulatory Visit (HOSPITAL_COMMUNITY)
Admission: RE | Admit: 2020-03-25 | Discharge: 2020-03-25 | Disposition: A | Discharge status: Home / Self Care | Payer: Medicaid Other | Source: Ambulatory Visit | Attending: Family Medicine | Admitting: Family Medicine

## 2020-03-25 VITALS — BP 128/88 | HR 94 | Ht 62.0 in | Wt 255.2 lb

## 2020-03-25 DIAGNOSIS — N949 Unspecified condition associated with female genital organs and menstrual cycle: Secondary | ICD-10-CM

## 2020-03-25 DIAGNOSIS — N760 Acute vaginitis: Secondary | ICD-10-CM

## 2020-03-25 DIAGNOSIS — Z202 Contact with and (suspected) exposure to infections with a predominantly sexual mode of transmission: Secondary | ICD-10-CM | POA: Insufficient documentation

## 2020-03-25 DIAGNOSIS — B9689 Other specified bacterial agents as the cause of diseases classified elsewhere: Secondary | ICD-10-CM

## 2020-03-25 DIAGNOSIS — R102 Pelvic and perineal pain: Secondary | ICD-10-CM

## 2020-03-25 LAB — POCT URINALYSIS DIP (MANUAL ENTRY)
Bilirubin, UA: NEGATIVE
Blood, UA: NEGATIVE
Glucose, UA: NEGATIVE mg/dL
Ketones, POC UA: NEGATIVE mg/dL
Leukocytes, UA: NEGATIVE
Nitrite, UA: NEGATIVE
Protein Ur, POC: NEGATIVE mg/dL
Spec Grav, UA: >=1.03 — AB (ref 1.010–1.025)
Urobilinogen, UA: 0.2 E.U./dL
pH, UA: 5.5 (ref 5.0–8.0)

## 2020-03-25 LAB — POCT WET PREP (WET MOUNT)
Clue Cells Wet Prep Whiff POC: POSITIVE
Trichomonas Wet Prep HPF POC: ABSENT

## 2020-03-25 NOTE — Patient Instructions (Signed)
Que bueno verte hoy. Lamento que el dolor vaginal te est molestando. hoy hicimos algunas pruebas para detectar infecciones de transmisin sexual. Me pondr en contacto con los resultados si son anormales. tambin revisamos su orina para Engineer, manufacturing una infeccin. no mostr infeccin.  no est claro cul es la causa del dolor. podra ser el crecimiento en la vagina o algo de sequedad vaginal. por favor haga un seguimiento con su pcp para esto. por favor use condones cuando Control and instrumentation engineer

## 2020-03-26 ENCOUNTER — Telehealth: Payer: Self-pay | Admitting: Family Medicine

## 2020-03-26 ENCOUNTER — Other Ambulatory Visit: Payer: Self-pay | Admitting: Family Medicine

## 2020-03-26 ENCOUNTER — Ambulatory Visit: Payer: Medicaid Other | Admitting: Obstetrics and Gynecology

## 2020-03-26 LAB — CERVICOVAGINAL ANCILLARY ONLY
Chlamydia: NEGATIVE
Comment: NEGATIVE
Comment: NORMAL
Neisseria Gonorrhea: NEGATIVE

## 2020-03-26 MED ORDER — CLINDAMYCIN PHOSPHATE 2 % VA CREA
1.0000 | TOPICAL_CREAM | Freq: Every day | VAGINAL | 0 refills | Status: DC
Start: 2020-03-26 — End: 2020-03-26

## 2020-03-26 MED ORDER — CLINDAMYCIN PHOSPHATE 2 % VA CREA
1.0000 | TOPICAL_CREAM | Freq: Every day | VAGINAL | 0 refills | Status: AC
Start: 2020-03-26 — End: 2020-04-02

## 2020-03-26 NOTE — Progress Notes (Signed)
Transmission to pharmacy failed. Resending to pharmacy, failed to transmit again. Called pharmacy and gave verbal orders to pharmacist Threasa Beards) as written by Dr. Allena Katz.   Veronda Prude, RN

## 2020-03-26 NOTE — Telephone Encounter (Signed)
Called patient to inform her that she has BV. She has an intolerance to flagyl and she prefers an applicator instead of a pill. Send clindamycin 2% cream applicator to pharmacy, to take for 7 days. Recommended to abstain from sexual intercourse for the next 7 days. Pt expressed understanding.

## 2020-03-26 NOTE — Addendum Note (Signed)
Addended by: Veronda Prude on: 03/26/2020 09:44 AM   Modules accepted: Orders

## 2020-03-27 DIAGNOSIS — N760 Acute vaginitis: Secondary | ICD-10-CM | POA: Insufficient documentation

## 2020-03-27 DIAGNOSIS — B9689 Other specified bacterial agents as the cause of diseases classified elsewhere: Secondary | ICD-10-CM | POA: Insufficient documentation

## 2020-03-27 DIAGNOSIS — R102 Pelvic and perineal pain: Secondary | ICD-10-CM | POA: Insufficient documentation

## 2020-03-27 NOTE — Assessment & Plan Note (Addendum)
Wet prep positive for BV. Patient intolerant to flagyl and would like pessary cream instead of pill. Clindamycin cream sent to pharmacy. Recommended against sexual intercourse for 7 days.

## 2020-03-27 NOTE — Progress Notes (Signed)
    SUBJECTIVE:   CHIEF COMPLAINT / HPI:   Deborah Chandler is a 39 year old female who presents today for vaginal pain  Spanish speaking interpretor was used for this encounter  Vaginal pain Pt endorses 3 day history of pain inside the vagina. She is sexually active with her husband and also sexually active with another female and endorses dyspyreunia both times. She has also noticed some spotting after sexual intercourse. She denies using condoms but does have nexplanon in place. Has irregular bleeding due to nexaplanon which is normal for her. Denies vaginal discharge. She is seeing Gyne for a long standing vaginal mass and thinks this may be the cause. She has also experienced other symptoms such as dysuria which started yesterday. Denies hematuria.  PERTINENT  PMH / PSH: high risk sexual behavior, chronic cystitis  OBJECTIVE:   BP 128/88   Pulse 94   Ht 5\' 2"  (1.575 m)   Wt 255 lb 3.2 oz (115.8 kg)   LMP 03/24/2020 (Exact Date)   SpO2 98%   BMI 46.68 kg/m   General: Alert and cooperative and appears to be in no acute distress Cardio: Well perfused   Pulm: speaking in full sentences, normal respiratory effort Abdomen: Bowel sounds normal. Abdomen soft and non-tender.  Neuro: Cranial nerves grossly intact  Pelvic exam: chaperoned by CMA Sharrie. Tolerated well without discomfort. Peduculated pink coloured vaginal mass visualized on right lateral side of vaginal wall.  GC, chlamydia and wet prep swabs taken. Mild discomfort on internal exam on right lower pelvis.  ASSESSMENT/PLAN:   Vaginal pain Unclear etiology of vaginal pain. Considered the vaginal mass (present on exam today) as cause of pain however per chart review has been present for many years, she was referred to [facility] however I do not see any notes on the system. It is unclear whether patient followed up with Gyne for this. Also considered vaginal atrophy as cause of pain however patient is not of typical age group. GC  chlamydia negative today. U preg negative. UA negative. Counseled patient on safe sex and to use barrier methods especially when having more than 1 sexual partner. Also recommended to use lubrication to minimize dyspareunia. Would recommend following up with PCP if patient has persistent symptoms.  Bacterial vaginosis Wet prep positive for BV. Patient intolerant to flagyl and would like pessary cream instead of pill. Clindamycin cream sent to pharmacy. Recommended against sexual intercourse for 7 days.     [facility], MD [facility]

## 2020-03-27 NOTE — Assessment & Plan Note (Addendum)
Unclear etiology of vaginal pain. Considered the vaginal mass (present on exam today) as cause of pain however per chart review has been present for many years, she was referred to [facility] however I do not see any notes on the system. It is unclear whether patient followed up with Gyne for this. Also considered vaginal atrophy as cause of pain however patient is not of typical age group. GC chlamydia negative today. U preg negative. UA negative. Counseled patient on safe sex and to use barrier methods especially when having more than 1 sexual partner. Also recommended to use lubrication to minimize dyspareunia. Would recommend following up with PCP if patient has persistent symptoms.

## 2020-03-31 ENCOUNTER — Encounter: Payer: Self-pay | Admitting: Student in an Organized Health Care Education/Training Program

## 2020-03-31 ENCOUNTER — Ambulatory Visit (INDEPENDENT_AMBULATORY_CARE_PROVIDER_SITE_OTHER): Payer: Self-pay | Admitting: Student in an Organized Health Care Education/Training Program

## 2020-03-31 ENCOUNTER — Other Ambulatory Visit: Payer: Self-pay

## 2020-03-31 VITALS — BP 130/90 | HR 78 | Wt 254.0 lb

## 2020-03-31 DIAGNOSIS — R350 Frequency of micturition: Secondary | ICD-10-CM

## 2020-03-31 DIAGNOSIS — R300 Dysuria: Secondary | ICD-10-CM

## 2020-03-31 DIAGNOSIS — K581 Irritable bowel syndrome with constipation: Secondary | ICD-10-CM

## 2020-03-31 LAB — POCT URINE PREGNANCY: Preg Test, Ur: NEGATIVE

## 2020-03-31 LAB — POCT URINALYSIS DIP (MANUAL ENTRY)
Bilirubin, UA: NEGATIVE
Glucose, UA: NEGATIVE mg/dL
Ketones, POC UA: NEGATIVE mg/dL
Leukocytes, UA: NEGATIVE
Nitrite, UA: NEGATIVE
Protein Ur, POC: NEGATIVE mg/dL
Spec Grav, UA: 1.015 (ref 1.010–1.025)
Urobilinogen, UA: 0.2 E.U./dL
pH, UA: 6 (ref 5.0–8.0)

## 2020-03-31 MED ORDER — METAMUCIL SMOOTH TEXTURE 58.6 % PO POWD: 1.0000 | Freq: Three times a day (TID) | ORAL | 2 refills | Status: DC

## 2020-03-31 NOTE — Progress Notes (Signed)
    SUBJECTIVE:   CHIEF COMPLAINT / HPI: stomach issues  Abdominal pain began 2-3 weeks ago. and 6 days for back pain. Includes the whole abdomen. Has not changed since it started. Feels like a burning in the stomach area. Sometimes I feel like a cramp. Does not radiate. Denies nausea/vomiting. Denies diarrhea/constipation. Denies blood in stool. Has not taken any medicine. Has not noticed anything made it better.  Chronic constipation.  If she eats, she needs to use the bathroom for most foods. Not when she eats soup or mcdonalds. The pain is there when she wakes up. Has had this problem before. Never had any abdominal surgeries.  nexplanon in place. No concerns for STDs today. Sexually active with her one partner.  Positive for urinary frequency and urgency. Intermittent burning with urination. Denies vaginal discharge. She did not take the treatment for recently diagnosed BV infection due to insurance issues. Denies fevers.   PERTINENT  PMH / PSH: positive for BV 7/20 and has not picked up treatment.   OBJECTIVE:   BP (!) 130/90   Pulse 78   Wt (!) 254 lb (115.2 kg)   LMP 03/24/2020 (Exact Date)   SpO2 98%   BMI 46.46 kg/m   General: NAD, pleasant, able to participate in exam Abdomen: soft, tender to palpation in LLQ and suprapubic region. No masses appreciated.  Negative CVA tenderness bilaterally. Extremities: no edema or cyanosis. WWP. Skin: warm and dry, no rashes noted Neuro: alert and oriented x4, no focal deficits Psych: Normal affect and mood  ASSESSMENT/PLAN:   Dysuria -Urinalysis and urine pregnancy negative -Recommended patient complete her BV treatment that was previously prescribed  IBS (irritable bowel syndrome) Predominantly constipation. Exam was reassuring -Prescribed fiber to be taken regularly -Provided patient with handout for education -Return for repeat visit if symptoms are not improved with fiber in diet changes     Leeroy Bock, DO [facility]  Health [facility]

## 2020-03-31 NOTE — Patient Instructions (Signed)
It was a pleasure to see you today!  To summarize our discussion for this visit:  Your urine was negative for signs of infection. There was some trace blood which could be from your spotting and not in your urine. We should recheck your urine at your next visit to make sure.   We should consider IBS as a possible diagnosis for your pain. Below are some suggestions on how to treat this condition.  Please go to your pharmacy to pick up your treatment for BV. You can ask them there how much the treatment will cost.  Su orina fue negativa para detectar signos de infeccin. Hubo algunos rastros de sangre que podran ser de su manchado y no de su orina. Debemos volver a controlar su orina en su prxima visita para asegurarnos. Debemos considerar el SII como un posible diagnstico de su dolor. A continuacin se presentan algunas sugerencias sobre cmo tratar esta afeccin. Vaya a su farmacia para recoger su tratamiento para la VB. All puede preguntarles cunto Marketing executive.  Some additional health maintenance measures we should update are: Health Maintenance Due  Topic Date Due  . Hepatitis C Screening  Never done  . COVID-19 Vaccine (1) Never done  .    Please return to our clinic to see me as needed.  Call the clinic at [phone] if your symptoms worsen or you have any concerns.   Thank you for allowing me to take part in your care,  Dr. Jamelle Rushing    Sndrome del intestino irritable en ad ultos Irritable Bowel Syndrome, Adult  El sndrome del intestino irritable (SII) se trata de un grupo de sntomas que afectan a los rganos responsables de la digestin (tubo digestivo o tracto gastrointestinal [GI]). El SII no es Doctor, hospital. Para regular la manera en que funciona el tracto GI, el cuerpo enva seales entre los intestinos y Secretary/administrator. Si tiene SII, puede haber un problema con estas seales. Como resultado, el tracto GI no funciona con normalidad.  Los intestinos pueden volverse ms sensibles y Publishing rights manager de forma exagerada a determinadas cosas. Esto puede ocurrir especialmente cuando consume determinados alimentos o cuando est bajo estrs. Hay cuatro tipos de SII. Estos tipos pueden determinarse en funcin de la consistencia de la materia fecal (heces):  SII con diarrea.  SII con estreimiento.  SII mixto.  SII no tipificado. Es importante saber qu tipo de SII padece. Ciertos tratamientos pueden ser ms tiles para ciertos tipos de SII. Cules son las causas? Se desconoce la causa exacta del SII. Qu incrementa el riesgo? Es posible que tenga un riesgo ms alto de padecer SII si:  Es mujer.  Es Adult nurse de 40aos de edad.  Tiene antecedentes familiares de SII.  Tiene una afeccin de salud mental, como depresin, ansiedad o trastorno de estrs postraumtico.  Tiene una infeccin bacteriana en el tracto GI. Cules son los signos o los sntomas? Los sntomas de SII pueden variar de Neomia Dear persona a Educational psychologist. El sntoma principal es el dolor o el malestar abdominal. Otros sntomas incluyen generalmente uno o ms de los siguientes:  Diarrea, estreimiento, o ambos.  Hinchazn abdominal o meteorismo.  Sensacin de saciedad despus de comer poco o una cantidad normal de comida.  Gases frecuentes.  Mucosidad en las heces.  Sensacin de tener ms heces por eliminar despus de una deposicin. Los sntomas suelen aparecer y Geneticist, molecular. Pueden ser causador por el estrs, una afeccin de salud mental o ciertos alimentos. Cmo se diagnostica? Esta  afeccin se puede diagnosticar en funcin de un examen fsico, sus antecedentes mdicos y sus sntomas. Pueden hacerle estudios, por ejemplo:  Anlisis de [place].  Una prueba de heces.  Radiografas.  Exploracin por tomografa computarizada (TC).  Colonoscopa. Este es un procedimiento en el que su tracto GI se observa con un tubo largo, delgado y flexible. Cmo se trata? No  hay cura para el SII, pero el tratamiento puede ayudar a [employer] sntomas. El tratamiento depende del tipo de SII que tenga, y puede incluir:  Cambios en la dieta, por ejemplo: ? Evitar los alimentos que causan sntomas. ? Beber ms agua. ? Seguir una dieta baja en FODMAP (oligosacridos, disacridos, monosacridos y polioles fermentables) por hasta 6 semanas, o segn se lo haya indicado el mdico. Los FODMAP son azcares difciles de digerir para International aid/development worker. ? Consumir ms fibra. ? Ingerir comidas de [employer] a la [address].  Medicamentos. Estos pueden incluir lo siguiente: ? Suplementos de fibra, si est estreido. ? Medicamentos para Landscape architect (antidiarreicos). ? Medicamentos para ayudar a Acupuncturist (espasmos) en su tracto GI (antiespasmdicos). ? Medicamentos para ayudar con las afecciones de salud mental, como antidepresivos o calmantes.  Psicoterapia o apoyo psicolgico.  Printmaker con un especialista en dietas y nutricin (nutricionista) para elaborar un plan de comidas que sea adecuado para usted.  Controlar el estrs. Siga estas indicaciones en su casa: Comida y bebida  Siga una dieta saludable.  Ingiera comidas de tamao mediano alrededor de la misma [employer]. No consuma comidas abundantes.  Gradualmente introduzca a su dieta ms alimentos ricos en fibra. Estos incluyen cereales integrales, frutas y verduras. Esto puede ser especialmente til si tiene SII con estreimiento.  Siga una dieta baja en FODMAP.  Beba suficiente lquido como para Pharmacologist la orina de color amarillo plido.  Lleve un registro diario de los alimentos que parecen provocarle sntomas.  Evite ingerir alimentos y bebidas que: ? Wellsite geologist. ? Empeoran sus sntomas. Los productos lcteos, las bebidas con cafena y los refrescos pueden hacer que los sntomas empeoren en [employer]. Instrucciones  generales  [employer] de venta [place], los recetados y los suplementos solamente como se lo haya indicado el mdico.  Ejerctese lo suficiente. Debe realizar al menos de ejercicios de intensidad moderada todas las semanas.  Controle el estrs. Dormir lo suficiente y Radio producer ejercicio pueden ayudarlo a Charity fundraiser.  Concurra a todas las visitas de [address] se lo hayan indicado el mdico y Training and development officer. Esto es importante. Consumo de alcohol  No beba alcohol si: ? Su mdico le indica no hacerlo. ? Est embarazada, cree que puede estar embarazada o est tratando de quedar embarazada.  Si bebe alcohol, limite la cantidad que consume: ? De 0 a 1 medida por da para las mujeres. ? De 0 a 2 medidas por da para los hombres.  Est atento a la cantidad de alcohol que hay en las bebidas que toma. En los [place] una medida equivale a una botella tpica de cerveza (12 onzas), media copa de vino (5 onzas) o una medida de bebida blanca (1 onza). Comunquese con un mdico si tiene:  Nurse, children's.  Prdida de peso.  Dificultad o dolor al tragar.  Diarrea que empeora. Solicite ayuda inmediatamente si tiene:  Dolor abdominal intenso.  Grant Ruts.  Diarrea con sntomas de deshidratacin, como mareos o sequedad en la boca.  Sangre color rojo brillante en  las heces.  Heces negras y de aspecto alquitranado.  Hinchazn abdominal.  Vmitos persistentes.  Sangre en el vmito. Resumen  El sndrome del intestino irritable (SII) no es Doctor, hospital. Es un grupo de sntomas que afectan la digestin.  Los intestinos pueden volverse ms sensibles y Publishing rights manager de forma exagerada a determinadas cosas. Esto puede ocurrir especialmente cuando consume determinados alimentos o cuando est bajo estrs.  No hay cura para el SII, pero el tratamiento puede ayudar a [employer] sntomas. Esta informacin no tiene Theme park manager el consejo del mdico.  Asegrese de hacerle al mdico cualquier pregunta que tenga. Document Revised: 12/03/2017 Document Reviewed: 09/22/2017 Elsevier Patient Education  2020 [employer].

## 2020-04-02 ENCOUNTER — Ambulatory Visit: Payer: Medicaid Other | Admitting: Student in an Organized Health Care Education/Training Program

## 2020-04-03 DIAGNOSIS — R300 Dysuria: Secondary | ICD-10-CM | POA: Insufficient documentation

## 2020-04-03 DIAGNOSIS — K589 Irritable bowel syndrome without diarrhea: Secondary | ICD-10-CM | POA: Insufficient documentation

## 2020-04-03 MED FILL — CLINDAMYCIN 2% VAGINAL CRM: 2 | 20 days supply | Qty: 40 | Fill #0

## 2020-04-03 NOTE — Assessment & Plan Note (Signed)
Predominantly constipation. Exam was reassuring -Prescribed fiber to be taken regularly -Provided patient with handout for education -Return for repeat visit if symptoms are not improved with fiber in diet changes

## 2020-04-03 NOTE — Assessment & Plan Note (Signed)
-  Urinalysis and urine pregnancy negative -Recommended patient complete her BV treatment that was previously prescribed

## 2020-04-08 ENCOUNTER — Ambulatory Visit (INDEPENDENT_AMBULATORY_CARE_PROVIDER_SITE_OTHER): Payer: Self-pay | Admitting: Family Medicine

## 2020-04-08 ENCOUNTER — Other Ambulatory Visit: Payer: Self-pay

## 2020-04-08 VITALS — BP 122/80 | HR 84 | Ht 61.61 in | Wt 256.2 lb

## 2020-04-08 DIAGNOSIS — M545 Low back pain, unspecified: Secondary | ICD-10-CM

## 2020-04-08 DIAGNOSIS — N939 Abnormal uterine and vaginal bleeding, unspecified: Secondary | ICD-10-CM

## 2020-04-08 DIAGNOSIS — R1032 Left lower quadrant pain: Secondary | ICD-10-CM

## 2020-04-08 DIAGNOSIS — R10812 Left upper quadrant abdominal tenderness: Secondary | ICD-10-CM

## 2020-04-08 DIAGNOSIS — K5900 Constipation, unspecified: Secondary | ICD-10-CM

## 2020-04-08 MED ORDER — POLYETHYLENE GLYCOL 3350 17 GM/SCOOP PO POWD: 17.0000 g | Freq: Every day | ORAL | 0 refills | Status: DC

## 2020-04-08 MED FILL — POLYETHYLENE GLYCOL 3350 PO: 17 | 14 days supply | Qty: 238 | Fill #0

## 2020-04-08 NOTE — Patient Instructions (Addendum)
Wonderful to see you.   For your abdominal pain: We will get some labs today.  We are questioning if constipation or your recent vaginal infection is contributing.  I recommend you follow-up with your OB/GYN as scheduled later this month.  I will send in MiraLAX, which you can take with the Metamucil.  You can increase the MiraLAX to 2 capfuls to achieve at least 1 soft stool on a daily basis.  Make sure you are drinking plenty of water, around 60 ounces a day.  Please take the clindamycin vaginal gel to treat the vaginal infection, may be contributing to your spotting.  For your back pain: I have provided some exercises and stretches to try.  You can also use ice/heat several times a day to help with this.  Tylenol 650 mg every 4-6 hours can help as well.  Para su dolor abdominal: hoy haremos algunos anlisis de laboratorio. Nos preguntamos si est contribuyendo el estreimiento o su infeccin vaginal reciente. Le recomiendo que haga un seguimiento con su obstetra / gineclogo segn lo programado a finales de Chief Executive Officer. Enviar MiraLAX, que puedes llevar con Metamucil. Puede aumentar el MiraLAX a 2 tapones para lograr al menos 1 deposicin blanda al da. Asegrese de beber [employer], alrededor de 60 onzas al [employer]. Tome el gel vaginal de clindamicina para tratar la infeccin vaginal, ya que puede estar contribuyendo a su manchado.  Para su dolor de espalda: le he proporcionado algunos ejercicios y estiramientos para probar. Tambin puede usar hielo / calor varias veces al da para ayudar con esto. Tylenol 650 mg cada 4-6 horas tambin puede ayudar.

## 2020-04-08 NOTE — Progress Notes (Signed)
SUBJECTIVE:   CHIEF COMPLAINT / HPI: "still having stomach pains, spotting"   Deborah Chandler is a 39 year old female presenting discussed the following:  Daughter present and provided interpretation.  Declined use of Spanish interpreter via iPad.  Abdominal pain: Last seen on 7/26 by her PCP for a several week history of abdominal pain, crampy in nature and all over.  Thought it was likely IBS, recommended increasing fiber and dietary changes.  Today, she reports her abdominal pain and back pain are still present.  Has not progressed.  Reports approximate 3-week history of both.  Feels like her abdominal pain is worse in the morning, seems to ease off by the afternoon but still present.  Describes as cramping and all over, but sometimes can hurt more around her bellybutton.  She previously has been diagnosed with gastritis/heartburn, does not feel like this is similar.  The Metamucil initially helped, however now is not noticing any difference.  Does not notice it is worse after eating.  Having frequent bowel movements daily, however they are very small and pellet-like, never getting full relief.  Denies any nausea, vomiting, fever, melena, hematochezia.  No known sick contacts.  Has intermittently had abdominal discomforts for several years.  Has appointment with OB/GYN on August 19th.  Recent transvaginal ultrasound in 11/2019 showing uterine adenomyosis.  Back pain: Associated with above.  Lower back, bilateral.  Seems to be worse with bending forward.  No associated weakness, numbness, bowel/bladder incontinence/retention, saddle anesthesia.  Vaginal spotting: Also present for about the past 3 weeks.  Had a recent urine pregnancy test on 7/26 which was negative.  Last sexually active several weeks ago.  Has a Nexplanon in place.  States she would have spotting "here and there" but is never been this persistent.  Light flow, however did pass a small blood clot recently.  No associated lower cramping.   She recently was diagnosed with BV a few weeks ago however has not completed treatment due to spotting.  Gonorrhea/chlamydia on 7/20 was negative.  No associated lightheadedness/dizziness, chest pain, SOB, presyncopal symptoms.  PERTINENT  PMH / PSH: Chronic constipation, chronic pelvic pain with chronic cystitis, Nexplanon in place  OBJECTIVE:   BP 122/80   Pulse 84   Ht 5' 1.61" (1.565 m)   Wt 256 lb 3.2 oz (116.2 kg)   LMP 03/24/2020 (Exact Date)   SpO2 98%   BMI 47.45 kg/m   General: Alert, NAD HEENT: NCAT, MMM Cardiac: RRR Lungs: No increased WOB  Abdomen: Soft, mild tenderness to palpation of left lower quadrant without any rebounding or guarding, non-distended.  Normal active bowel sounds in all quadrants.  Difficult to palpate uterus or hepatosplenomegaly due to body habitus. Msk: Moves all extremities spontaneously, normal gait.  Nontender to lumbar spinous processes.  No lumbar step-off.  Tenderness around bilateral lumbar paraspinal musculature.  5/5 lower extremity strength bilaterally with full ROM.  No CVA tenderness bilaterally. Ext: Warm, dry, 2+ distal pulses   ASSESSMENT/PLAN:   LUQ abdominal tenderness However reports generalized abdominal pain.  Reassuring physical exam.  Suspect subacute abdominal pain is in the setting of worsening constipation, may have IBS component.  Considered diverticulitis given tenderness location on exam, however with longer duration, afebrile, and without any associated N/V suspect this is less likely.  Could also consider referred pelvic pain from known adenomyosis.  Will obtain CMP and lipase to rule out pancreatic or liver abnormality given nonspecific reported pain. Recommended hydration, continuing Metamucil, and adding osmotic agent, Miralax,  daily with titration up as needed for at least 1 soft stool daily. Keep abdominal pain diary.   Low back pain Subacute and nonprogressive, no neurological s/sx.  May be referred pain from above.   However, given body habitus and tenderness to paraspinal lumbar musculature could consider muscular strain vs herniated disc as worse with bending forward.  Provided with lumbar strengthening exercises and stretches.  May use ice/heat and Tylenol as needed.  Abnormal uterine bleeding (AUB) Reported spotting for the last 3 weeks in the setting of Nexplanon use.  She has been seen for this before and recommended concurrent OCPs, however do not see this in her current medication list.  Recommended proceeding with clindamycin (flagyl allergy) vaginal gel treatment for known BV and see if this improves spotting.  Could also be related to adenomyosis, following up with OB/GYN on 8/19.  Consider restarting OCPs if not improved after treatment.  Will obtain CBC to rule out associated anemia.    Follow-up if symptoms above not improving in the next 2 weeks or sooner if abdominal pain worsening, numbness/weakness, vomiting, fever, or melena/hematochezia.  Allayne Stack, DO [facility]

## 2020-04-09 LAB — COMPREHENSIVE METABOLIC PANEL
ALT: 11 IU/L (ref 0–32)
AST: 10 IU/L (ref 0–40)
Albumin/Globulin Ratio: 1.2 (ref 1.2–2.2)
Albumin: 3.8 g/dL (ref 3.8–4.8)
Alkaline Phosphatase: 77 IU/L (ref 48–121)
BUN/Creatinine Ratio: 14 (ref 9–23)
BUN: 9 mg/dL (ref 6–20)
Bilirubin Total: 0.5 mg/dL (ref 0.0–1.2)
CO2: 24 mmol/L (ref 20–29)
Calcium: 9.2 mg/dL (ref 8.7–10.2)
Chloride: 102 mmol/L (ref 96–106)
Creatinine, Ser: 0.65 mg/dL (ref 0.57–1.00)
GFR calc Af Amer: 131 mL/min/{1.73_m2} (ref >59)
GFR calc non Af Amer: 114 mL/min/{1.73_m2} (ref >59)
Globulin, Total: 3.2 g/dL (ref 1.5–4.5)
Glucose: 107 mg/dL — ABNORMAL HIGH (ref 65–99)
Potassium: 4.4 mmol/L (ref 3.5–5.2)
Sodium: 139 mmol/L (ref 134–144)
Total Protein: 7 g/dL (ref 6.0–8.5)

## 2020-04-09 LAB — CBC
Hematocrit: 37.9 % (ref 34.0–46.6)
Hemoglobin: 12.2 g/dL (ref 11.1–15.9)
MCH: 24.1 pg — ABNORMAL LOW (ref 26.6–33.0)
MCHC: 32.2 g/dL (ref 31.5–35.7)
MCV: 75 fL — ABNORMAL LOW (ref 79–97)
Platelets: 315 10*3/uL (ref 150–450)
RBC: 5.06 x10E6/uL (ref 3.77–5.28)
RDW: 13.4 % (ref 11.7–15.4)
WBC: 9.5 10*3/uL (ref 3.4–10.8)

## 2020-04-13 ENCOUNTER — Encounter: Payer: Self-pay | Admitting: Family Medicine

## 2020-04-13 DIAGNOSIS — R10812 Left upper quadrant abdominal tenderness: Secondary | ICD-10-CM | POA: Insufficient documentation

## 2020-04-13 DIAGNOSIS — N939 Abnormal uterine and vaginal bleeding, unspecified: Secondary | ICD-10-CM | POA: Insufficient documentation

## 2020-04-13 DIAGNOSIS — M545 Low back pain, unspecified: Secondary | ICD-10-CM | POA: Insufficient documentation

## 2020-04-13 NOTE — Assessment & Plan Note (Addendum)
Reported spotting for the last 3 weeks in the setting of Nexplanon use.  She has been seen for this before and recommended concurrent OCPs, however do not see this in her current medication list.  Recommended proceeding with clindamycin (flagyl allergy) vaginal gel treatment for known BV and see if this improves spotting.  Could also be related to adenomyosis, following up with OB/GYN on 8/19.  Consider restarting OCPs if not improved after treatment.  Will obtain CBC to rule out associated anemia.

## 2020-04-13 NOTE — Assessment & Plan Note (Addendum)
However reports generalized abdominal pain.  Reassuring physical exam.  Suspect subacute abdominal pain is in the setting of worsening constipation, may have IBS component.  Considered diverticulitis given tenderness location on exam, however with longer duration, afebrile, and without any associated N/V suspect this is less likely.  Could also consider referred pelvic pain from known adenomyosis.  Will obtain CMP and lipase to rule out pancreatic or liver abnormality given nonspecific reported pain. Recommended hydration, continuing Metamucil, and adding osmotic agent, Miralax, daily with titration up as needed for at least 1 soft stool daily. Keep abdominal pain diary.

## 2020-04-13 NOTE — Assessment & Plan Note (Signed)
Subacute and nonprogressive, no neurological s/sx.  May be referred pain from above.  However, given body habitus and tenderness to paraspinal lumbar musculature could consider muscular strain vs herniated disc as worse with bending forward.  Provided with lumbar strengthening exercises and stretches.  May use ice/heat and Tylenol as needed.

## 2020-04-24 ENCOUNTER — Other Ambulatory Visit (HOSPITAL_COMMUNITY)
Admission: RE | Admit: 2020-04-24 | Discharge: 2020-04-24 | Disposition: A | Discharge status: Home / Self Care | Payer: Medicaid Other | Source: Ambulatory Visit | Attending: Obstetrics and Gynecology | Admitting: Obstetrics and Gynecology

## 2020-04-24 ENCOUNTER — Encounter: Payer: Self-pay | Admitting: Obstetrics and Gynecology

## 2020-04-24 ENCOUNTER — Other Ambulatory Visit: Payer: Self-pay

## 2020-04-24 ENCOUNTER — Ambulatory Visit (INDEPENDENT_AMBULATORY_CARE_PROVIDER_SITE_OTHER): Payer: Self-pay | Admitting: Obstetrics and Gynecology

## 2020-04-24 VITALS — BP 135/92 | HR 77 | Wt 255.4 lb

## 2020-04-24 DIAGNOSIS — Z3046 Encounter for surveillance of implantable subdermal contraceptive: Secondary | ICD-10-CM

## 2020-04-24 DIAGNOSIS — Z124 Encounter for screening for malignant neoplasm of cervix: Secondary | ICD-10-CM | POA: Insufficient documentation

## 2020-04-24 DIAGNOSIS — N3090 Cystitis, unspecified without hematuria: Secondary | ICD-10-CM

## 2020-04-24 DIAGNOSIS — Z789 Other specified health status: Secondary | ICD-10-CM

## 2020-04-24 DIAGNOSIS — N814 Uterovaginal prolapse, unspecified: Secondary | ICD-10-CM

## 2020-04-24 DIAGNOSIS — N939 Abnormal uterine and vaginal bleeding, unspecified: Secondary | ICD-10-CM

## 2020-04-24 NOTE — Progress Notes (Signed)
States had a cycle for 3 weeks last month starting on the 19th
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
## 2020-04-25 LAB — CYTOLOGY - PAP
Chlamydia: NEGATIVE
Comment: NEGATIVE
Comment: NEGATIVE
Comment: NORMAL
Diagnosis: NEGATIVE
High risk HPV: NEGATIVE
Neisseria Gonorrhea: NEGATIVE

## 2020-05-24 IMAGING — US US PELVIS COMPLETE WITH TRANSVAGINAL
1 series · 13 of 25 positions shown · non-contrast
Comparison: Prior ultrasound from 10/18/2018.

CLINICAL DATA: Initial evaluation for pelvic pain.



[Series 1: us pelvis complete with transvaginal · 13 of 77 slices shown]
[im 1/77]
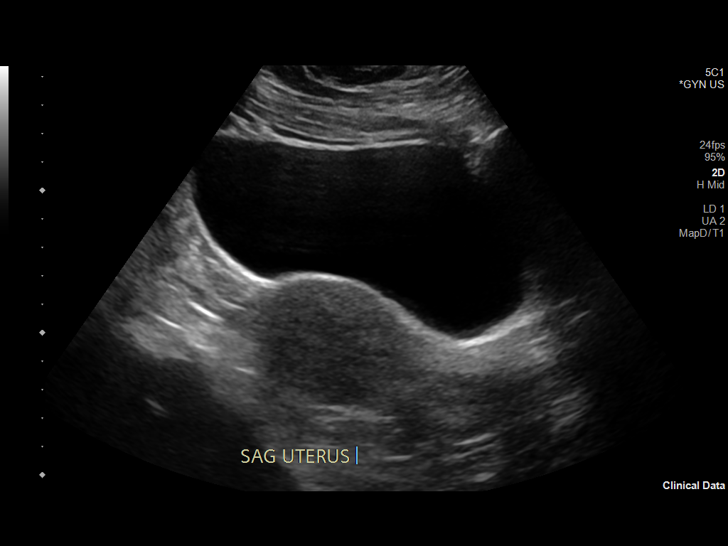
[im 7/77]
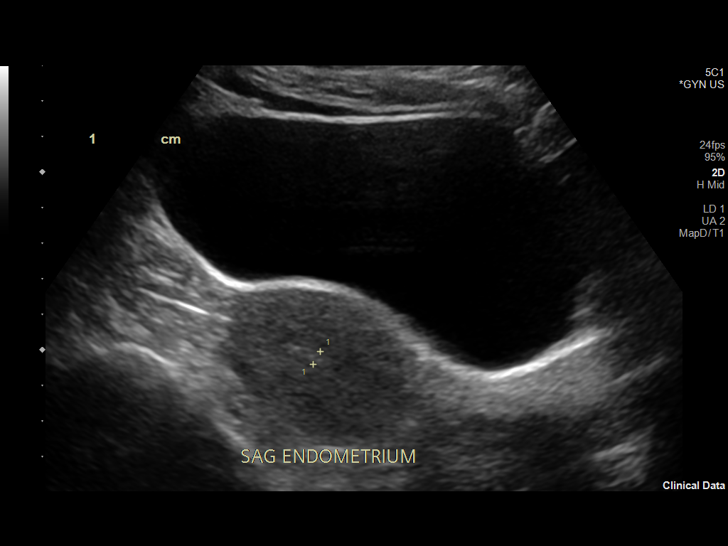
[im 13/77]
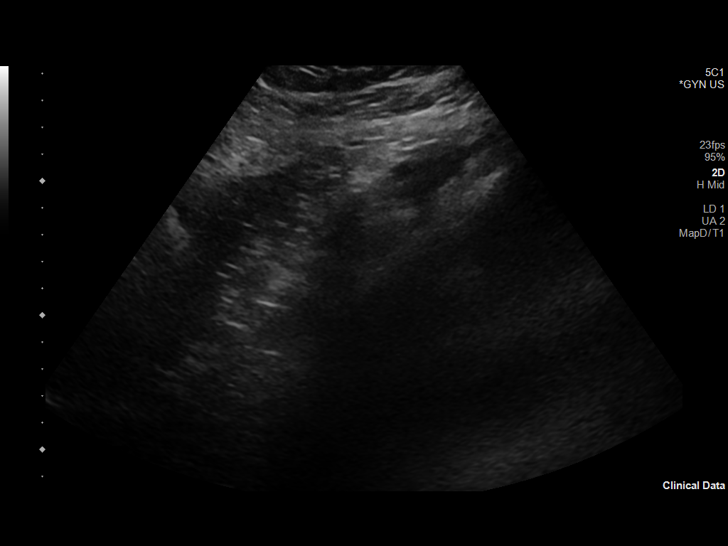
[im 20/77]
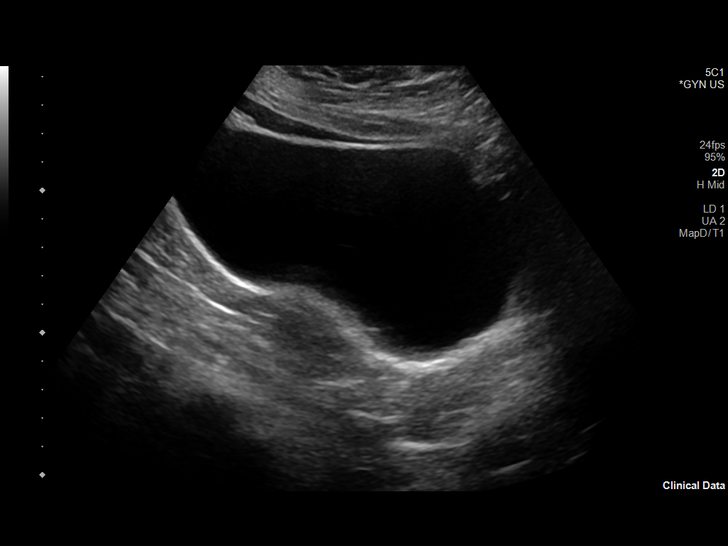
[im 26/77]
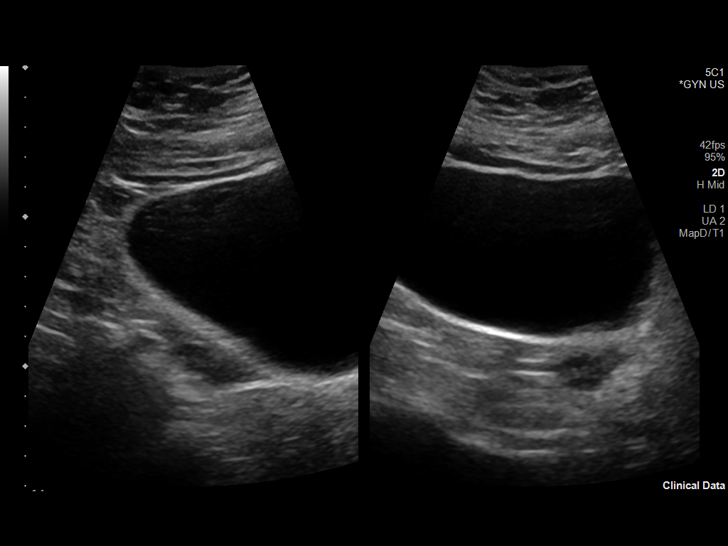
[im 32/77]
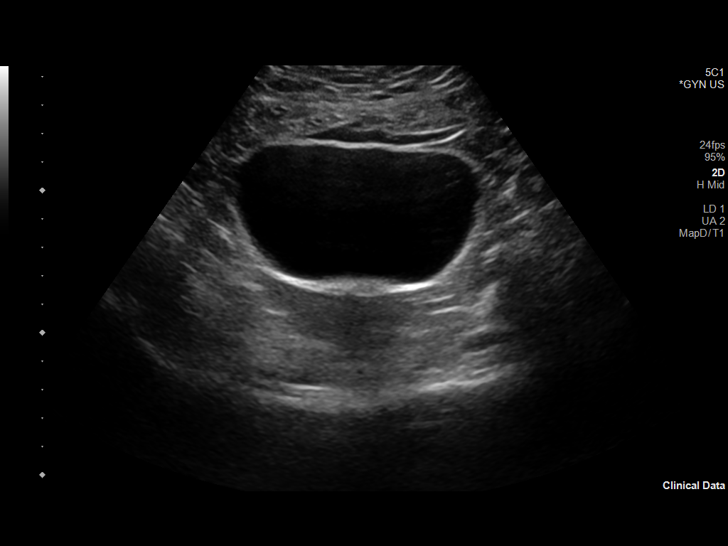
[im 39/77]
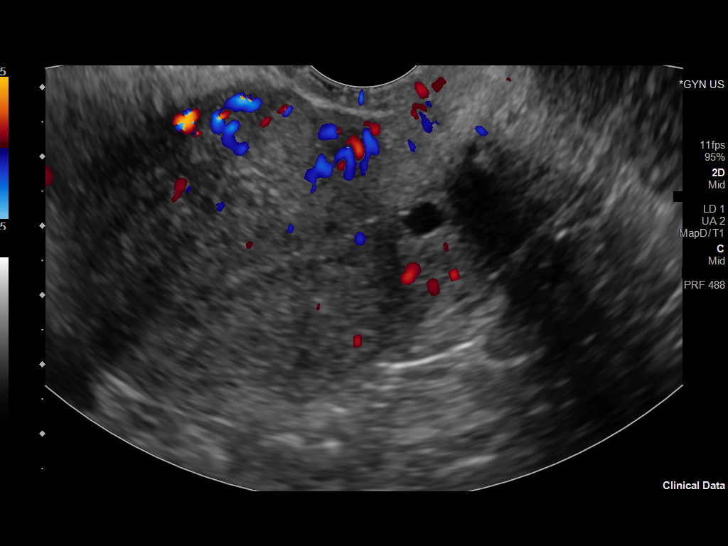
[im 45/77]
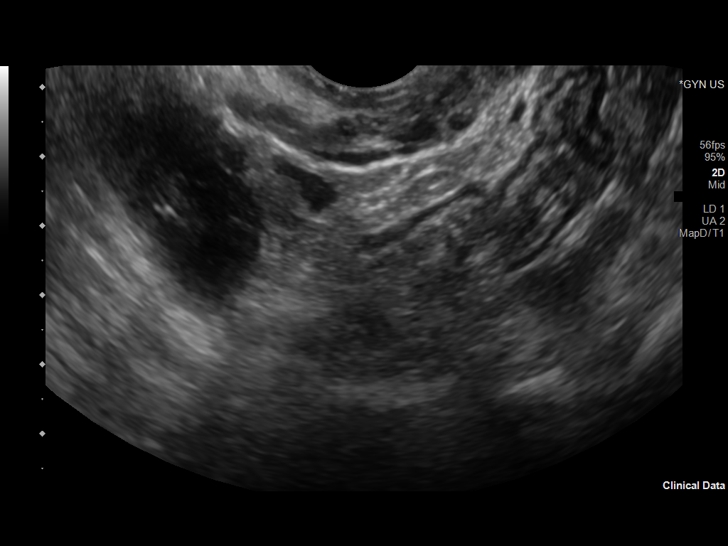
[im 51/77]
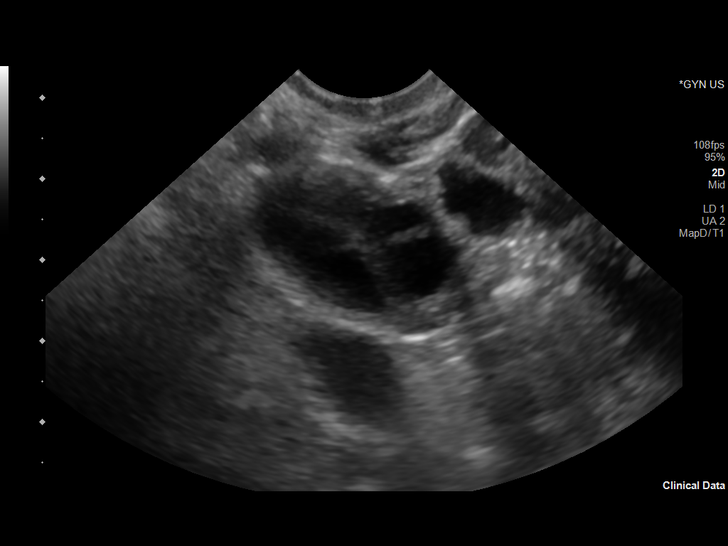
[im 58/77]
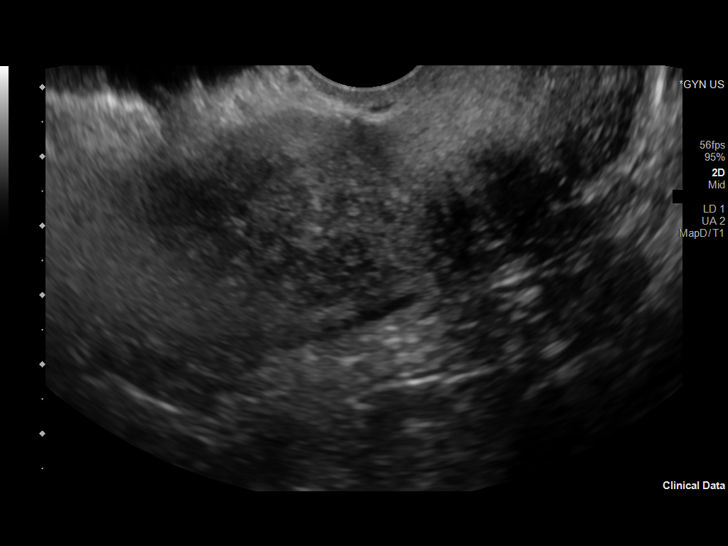
[im 64/77]
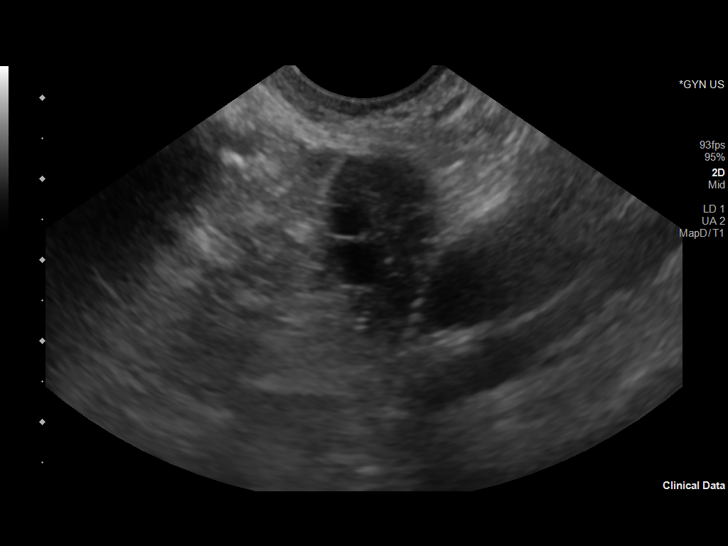
[im 70/77]
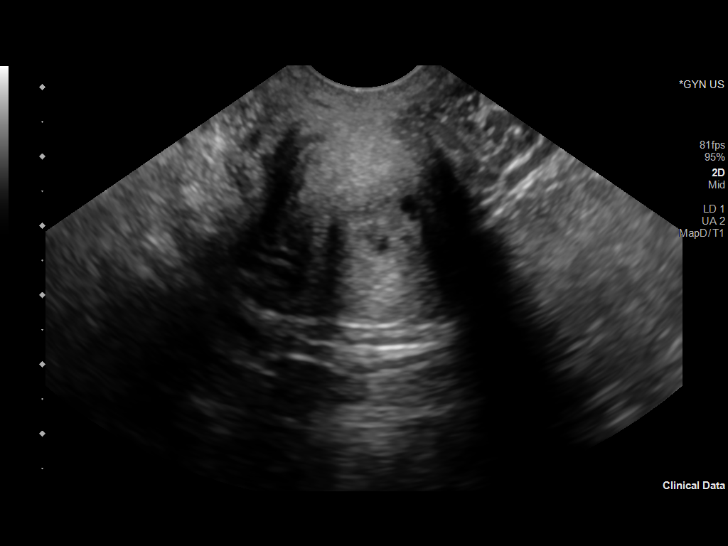
[im 77/77]
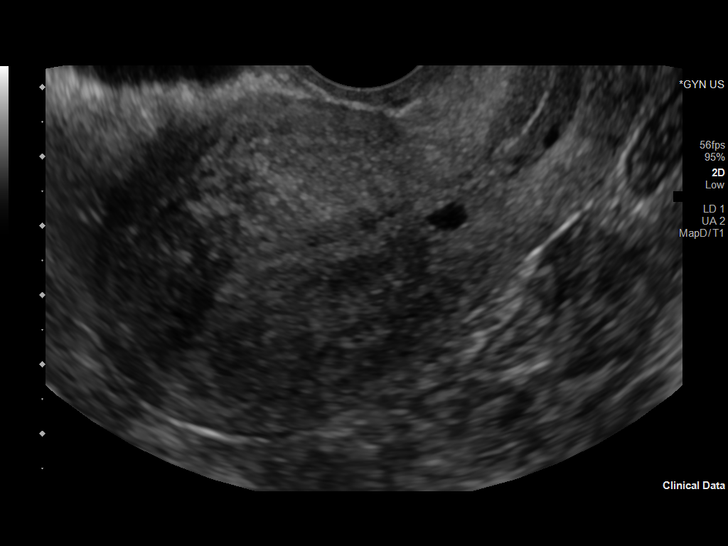

[13 of 25 positions shown; findings below may reference images not displayed]

FINDINGS: Uterus

Measurements: 9.2 x 4.8 x 5.4 cm = volume: 122.4 mL. No discrete
fibroids identified. Heterogeneous echotexture of the myometrium
with poor myometrial-endometrial interface and few scattered cystic
change, suggesting adenomyosis. Dominant 6 mm sub endometrial cyst
noted at the lower uterine segment, stable. Appearance is similar to
previous.

Endometrium

Thickness: 5.6 mm.  No focal abnormality visualized.

Right ovary

Measurements: 2.9 x 1.6 x 2.3 cm = volume: 5.5 mL. Normal
appearance/no adnexal mass.

Left ovary

Measurements: 2.8 x 1.9 x 1.4 cm = volume: 4.0 mL. Normal
appearance/no adnexal mass.

Other findings

No abnormal free fluid.
IMPRESSION: 1. Findings suggestive of uterine adenomyosis, similar to previous.
2. Otherwise normal pelvic ultrasound for age. No other acute
abnormality identified.

## 2020-05-28 ENCOUNTER — Ambulatory Visit: Payer: Medicaid Other | Admitting: Obstetrics and Gynecology

## 2020-07-25 ENCOUNTER — Other Ambulatory Visit: Payer: Self-pay

## 2020-07-25 ENCOUNTER — Other Ambulatory Visit: Payer: Self-pay | Admitting: Obstetrics and Gynecology

## 2020-07-25 ENCOUNTER — Ambulatory Visit (INDEPENDENT_AMBULATORY_CARE_PROVIDER_SITE_OTHER): Payer: Self-pay | Admitting: Obstetrics and Gynecology

## 2020-07-25 ENCOUNTER — Encounter: Payer: Self-pay | Admitting: Obstetrics and Gynecology

## 2020-07-25 VITALS — BP 122/79 | HR 74 | Ht 63.0 in | Wt 254.7 lb

## 2020-07-25 DIAGNOSIS — N939 Abnormal uterine and vaginal bleeding, unspecified: Secondary | ICD-10-CM

## 2020-07-25 DIAGNOSIS — N819 Female genital prolapse, unspecified: Secondary | ICD-10-CM | POA: Insufficient documentation

## 2020-07-25 DIAGNOSIS — N8189 Other female genital prolapse: Secondary | ICD-10-CM

## 2020-07-25 MED ORDER — PROGESTERONE 200 MG PO CAPS: ORAL_CAPSULE | ORAL | 1 refills | Status: DC

## 2020-07-25 MED FILL — PROGESTERONE 200 MG CAPSULE: 200 | 30 days supply | Qty: 10 | Fill #0

## 2020-07-25 NOTE — Progress Notes (Signed)
Patient ID: Deborah Chandler, female   DOB: [birth year], 39 y.o.   MRN: [id number] Ms Deborah Chandler presents for discussion in regards to her pelvic relaxation. She feels that it has worsen since I last saw her. She is interested in surgery  Still problems with AUB. Ran out of OCP's prescribed by PCP.   She denies any bowel or bladder dysfunction. Voids 3-4 times during the day and the same number at night She denies any excessive water of caffine intake.  Sexual active some discomfort.  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   Will cycle with Prometrium monthly. Refer to UroGyn for further eval and treatment of her pelvic relaxation. F/U PRN

## 2020-07-25 NOTE — Progress Notes (Signed)
Notes from Pt's last visit:   Assessment & Plan:   1. Abnormal uterine bleeding (AUB) Has nexplanon in place No current bleeding Return if worsens  2. Cystitis Improved  3. Uterine prolapse Reviewed pessary use versus surgery Interested in surgery, to f/u with Dr. Alysia Penna Reports h/o BTL although no scars on abdomen so not concerned about future fertility  4. Language barrier Engineer, structural used  5. Cervical cancer screening - Cytology - PAP( [place])   Routine preventative health maintenance measures emphasized. Please refer to After Visit Summary for other counseling recommendations.   Return in about 4 weeks (around 05/22/2020) for with Dr. Alysia Penna, Followup.  Total face-to-face time with patient: 35 minutes. Over 50% of encounter was spent on counseling and coordination of care.  Baldemar Lenis, M.D. [facility]ending [facility] [employer] [facility])

## 2020-08-20 NOTE — Progress Notes (Signed)
° ° °  SUBJECTIVE:   CHIEF COMPLAINT / HPI:   Cough, stomach pain, sore throat, body aches, and headache with cough since Saturday. tested negative for  at [facility] yesterday.  grandson was sick prior to her getting sick with similar symptoms.    Lives together with daughter and grandson.  Patient not vaccinated for covid.  Pt asking about getting flu shot today.  Pt has no hx of asthma or copd.    PERTINENT  PMH / PSH: nonsmoker  OBJECTIVE:   BP (!) 124/98    Pulse 94    Ht 5\' 3"  (1.6 m)    SpO2 96%    BMI 45.12 kg/m   Gen: alert, oriented.  With daughter and grandson.  HEENT: moist oral mucosa. No oropharyngeal erythema.   CV: RRR. No murmurs.  Pulm: no respiratory distress.  Mild Diffuse expiratory wheezing.   Abd: soft, nontender.  Ext: no pedal edema.    ASSESSMENT/PLAN:   Flu-like symptoms Body aches, malaise, cough with negative covid test in unvaccinated patient concerning for possible flu. Mild wheezing on exam consistent with lower respiratory infection, possibly RSV.   Advised symptomatic treatment including tylenol, nasal saline spray and guaifenicin for cough.  Advised pt to get flu test and if negative she can call to schedule flu vaccine.       [place], MD [facility]

## 2020-08-21 ENCOUNTER — Ambulatory Visit (INDEPENDENT_AMBULATORY_CARE_PROVIDER_SITE_OTHER): Payer: Self-pay | Admitting: Family Medicine

## 2020-08-21 ENCOUNTER — Other Ambulatory Visit: Payer: Self-pay

## 2020-08-21 VITALS — BP 124/98 | HR 94 | Ht 63.0 in

## 2020-08-21 DIAGNOSIS — R6889 Other general symptoms and signs: Secondary | ICD-10-CM

## 2020-08-21 MED ORDER — SALINE SPRAY 0.65 % NA SOLN: 1.0000 | NASAL | 1 refills | Status: DC | PRN

## 2020-08-21 MED ORDER — ACETAMINOPHEN 500 MG PO TABS: 500.0000 mg | ORAL_TABLET | Freq: Four times a day (QID) | ORAL | 0 refills | Status: DC | PRN

## 2020-08-21 MED ORDER — GUAIFENESIN 200 MG PO TABS: 200.0000 mg | ORAL_TABLET | ORAL | 0 refills | Status: DC | PRN
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
## 2020-08-21 NOTE — Patient Instructions (Signed)
It was nice to meet you today,  I am concerned you could possibly have flu.  Therefore I do not want to give you the flu shot until we confirm that you do not have the flu.  I would like you to go to the pharmacy such as [facility] or [facility] and asked to be flu tested.  Once that is negative you can make an appointment with our office to get the flu vaccine at any time.  To treat your symptoms you can use nasal saline spray, Mucinex, Tylenol, and drink tea with honey in it for sore throat.  Make sure you stay hydrated.  If you develop any difficulty breathing please seek medical attention.  Have a great day,  Frederic Jericho, MD

## 2020-08-22 DIAGNOSIS — R6889 Other general symptoms and signs: Secondary | ICD-10-CM | POA: Insufficient documentation

## 2020-08-22 NOTE — Assessment & Plan Note (Signed)
Body aches, malaise, cough with negative covid test in unvaccinated patient concerning for possible flu. Mild wheezing on exam consistent with lower respiratory infection, possibly RSV.   Advised symptomatic treatment including tylenol, nasal saline spray and guaifenicin for cough.  Advised pt to get flu test and if negative she can call us to schedule flu vaccine.

## 2020-08-28 ENCOUNTER — Ambulatory Visit: Payer: Medicaid Other | Admitting: Obstetrics and Gynecology
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
## 2020-09-01 ENCOUNTER — Encounter: Payer: Self-pay | Admitting: Obstetrics and Gynecology

## 2020-09-01 ENCOUNTER — Other Ambulatory Visit: Payer: Self-pay

## 2020-09-01 ENCOUNTER — Ambulatory Visit (INDEPENDENT_AMBULATORY_CARE_PROVIDER_SITE_OTHER): Payer: Medicaid Other | Admitting: Obstetrics and Gynecology

## 2020-09-01 VITALS — BP 148/76 | HR 80 | Wt 255.5 lb

## 2020-09-01 DIAGNOSIS — N941 Unspecified dyspareunia: Secondary | ICD-10-CM

## 2020-09-01 DIAGNOSIS — N816 Rectocele: Secondary | ICD-10-CM

## 2020-09-01 DIAGNOSIS — N8110 Cystocele, unspecified: Secondary | ICD-10-CM

## 2020-09-01 DIAGNOSIS — R350 Frequency of micturition: Secondary | ICD-10-CM

## 2020-09-01 DIAGNOSIS — N812 Incomplete uterovaginal prolapse: Secondary | ICD-10-CM

## 2020-09-01 LAB — POCT URINALYSIS DIPSTICK
Appearance: NORMAL
Bilirubin, UA: NEGATIVE
Blood, UA: NEGATIVE
Glucose, UA: NEGATIVE
Ketones, UA: NEGATIVE
Leukocytes, UA: NEGATIVE
Nitrite, UA: NEGATIVE
Protein, UA: NEGATIVE
Spec Grav, UA: 1.015 (ref 1.010–1.025)
Urobilinogen, UA: 0.2 E.U./dL
pH, UA: 5 (ref 5.0–8.0)

## 2020-09-01 NOTE — Progress Notes (Signed)
[employer] interpreter [employer] [phone]

## 2020-09-01 NOTE — Patient Instructions (Signed)
You have a stage 2 (out of 4) prolapse.  We discussed the fact that it is not life threatening but there are several treatment options. For treatment of pelvic organ prolapse, we discussed options for management including expectant management, conservative management, and surgical management, such as Kegels, a pessary, pelvic floor physical therapy, and specific surgical procedures.    

## 2020-10-01 ENCOUNTER — Other Ambulatory Visit: Payer: Medicaid Other

## 2020-10-01 DIAGNOSIS — Z20822 Contact with and (suspected) exposure to covid-19: Secondary | ICD-10-CM

## 2020-10-02 ENCOUNTER — Ambulatory Visit: Payer: Self-pay

## 2020-10-02 LAB — NOVEL CORONAVIRUS, NAA: SARS-CoV-2, NAA: NOT DETECTED

## 2020-10-02 LAB — SARS-COV-2, NAA 2 DAY TAT

## 2020-10-07 ENCOUNTER — Ambulatory Visit (INDEPENDENT_AMBULATORY_CARE_PROVIDER_SITE_OTHER): Payer: Self-pay | Admitting: Family Medicine

## 2020-10-07 ENCOUNTER — Other Ambulatory Visit: Payer: Self-pay

## 2020-10-07 ENCOUNTER — Other Ambulatory Visit: Payer: Self-pay | Admitting: Family Medicine

## 2020-10-07 ENCOUNTER — Encounter: Payer: Self-pay | Admitting: Family Medicine

## 2020-10-07 VITALS — BP 120/80 | HR 87 | Wt 256.6 lb

## 2020-10-07 DIAGNOSIS — Z23 Encounter for immunization: Secondary | ICD-10-CM

## 2020-10-07 DIAGNOSIS — N939 Abnormal uterine and vaginal bleeding, unspecified: Secondary | ICD-10-CM

## 2020-10-07 DIAGNOSIS — R319 Hematuria, unspecified: Secondary | ICD-10-CM | POA: Insufficient documentation

## 2020-10-07 LAB — POCT URINALYSIS DIP (MANUAL ENTRY)
Bilirubin, UA: NEGATIVE
Glucose, UA: NEGATIVE mg/dL
Ketones, POC UA: NEGATIVE mg/dL
Leukocytes, UA: NEGATIVE
Nitrite, UA: NEGATIVE
Protein Ur, POC: NEGATIVE mg/dL
Spec Grav, UA: >=1.03 — AB (ref 1.010–1.025)
Urobilinogen, UA: 0.2 E.U./dL
pH, UA: 5.5 (ref 5.0–8.0)

## 2020-10-07 LAB — POCT UA - MICROSCOPIC ONLY

## 2020-10-07 MED ORDER — PROGESTERONE 200 MG PO CAPS: 200.0000 mg | ORAL_CAPSULE | Freq: Every day | ORAL | 6 refills | Status: DC

## 2020-10-07 MED FILL — PROGESTERONE 200 MG CAPSULE: 200 | 28 days supply | Qty: 10 | Fill #0

## 2020-10-07 NOTE — Patient Instructions (Addendum)
  Fue un placer verte hoy!  Karl Pock por elegir [facility] para su atencin primaria.  Nuestros planes para hoy eran: Su orina estaba limpia, lo que significa que no hay infeccin. Tome progesterona: 1 pastilla al da durante [address] Haga [employer] primeros 10 das de cada mes. Puede solicitar recargas de la farmacia para el futuro. Le gustara beneficiarse de un mtodo anticonceptivo alternativo como el DIU (implante que se coloca directamente en el tero). Haga un seguimiento con su mdico de familia para discutir esto ms a fondo si est interesado.    Los mejores deseos,  [place], D.O.  It was a pleasure to see you today!  Thank you for choosing [facility] for your primary care.   Our plans for today were:  Your urine was clean - which means there is no infection  Please take Progesterone: 1 pill daily for 10 days. Do this for the first 10 days of every month. You can request refills from the pharmacy for the future.  You would like benefit from an alternative birth control like the IUD (implant that goes directly into the uterus). Follow up with your family doctor to discuss this further if you are interested.      Best Wishes,   Orpah Cobb, DO

## 2020-10-07 NOTE — Progress Notes (Signed)
   Subjective:   Patient ID: Jennife Zaucha    DOB: 25-Jun-1981, 40 y.o. female   MRN: [id number]  Quinesha Selinger is a 39 y.o. female with a history of vericose veins, GERD, IBS, AUB, dyspepsia, back pain here for irregular periods  Irregular Periods: Acute on chronic. Patient reports bleeding x 10 days. She notices when she pees, when she wipes, and sometimes in the toilet. Notes it is a small amount. She denies any dysuria, frequency, or urgency. Blood has been red, brown, and black at different times. Notes that it smells bad.  LMP: 09/27/20 Contraception: Nexplanon   Was seen by OBGYN on 04/24/20. At that time she had intermenstrual bleeding on nexplanon which improved with OCPs. She discontinued when bleeding resolved. She has history of uterine prolapse. Was seen by OBGYN again on 07/25/20 for recurrance of AUB. She was started on cycles of Prometrium (Progesterone) 200mg  to be taken first 10 days of each month. She notes that she didn't take them and lost them.   Review of Systems:  Per HPI.   Objective:   BP 120/80   Pulse 87   Wt 256 lb 9.6 oz (116.4 kg)   LMP 09/27/2020   SpO2 98%   BMI 45.45 kg/m  Vitals and nursing note reviewed.  General: pleasant middle aged female, sitting comfortably in exam chair, well nourished, well developed, in no acute distress with non-toxic appearance Resp: breathing comfortably on room air, speaking in full sentences MSK:  gait normal Neuro: Alert and oriented, speech normal  Assessment & Plan:   Abnormal uterine bleeding (AUB) Acute on chronic. Light spotting approximately every 2-3 months in setting of Nexplanon. Has been evaluated multiple times for this. Has history of noncompliance with recommended treatments including POPs and OCPs. Symptoms often self resolve.   Suspect likely dysfunctional uterine bleeding in setting of her current Nexplanon. Has had improvement in her menstrual cycles with Nexplanon but  would likely  benefit from IUD for better control of her bleeding.   Will restart prior recommended treatment by OBGYN which included Progesterone 200mg  daily x 10 days, should take first 10 days of each month. Recommended follow up with PCP to discuss alternative contraception method if desired. Patient voiced understanding and agreement with plan.   Hematuria Endorsed symptoms concerning for hematuria without infectious symptoms. UA obtained to rule out UTI which was negative for infection or microscopic hematuria. Provided reassurance. Follow up as needed.  Orders Placed This Encounter  Procedures  . Flu Vaccine QUAD 36+ mos IM  . POCT urinalysis dipstick  . POCT UA - Microscopic Only   Meds ordered this encounter  Medications  . progesterone (PROMETRIUM) 200 MG capsule    Sig: Take 1 capsule (200 mg total) by mouth daily for 10 days. Take the first 10 days of each month.    Dispense:  10 capsule    Refill:  6   Due to language barrier, an interpreter was present during the history-taking and subsequent discussion (and for part of the physical exam) with this patient. Spanish Mariel 09/29/2020  , DO PGY-3, [facility] 10/07/2020 9:42 PM

## 2020-10-07 NOTE — Assessment & Plan Note (Signed)
Acute on chronic. Light spotting approximately every 2-3 months in setting of Nexplanon. Has been evaluated multiple times for this. Has history of noncompliance with recommended treatments including POPs and OCPs. Symptoms often self resolve.   Suspect likely dysfunctional uterine bleeding in setting of her current Nexplanon. Has had improvement in her menstrual cycles with Nexplanon but  would likely benefit from IUD for better control of her bleeding.   Will restart prior recommended treatment by OBGYN which included Progesterone 200mg  daily x 10 days, should take first 10 days of each month. Recommended follow up with PCP to discuss alternative contraception method if desired. Patient voiced understanding and agreement with plan.

## 2020-10-07 NOTE — Assessment & Plan Note (Addendum)
Endorsed symptoms concerning for hematuria without infectious symptoms. UA obtained to rule out UTI which was negative for infection or microscopic hematuria. Provided reassurance. Follow up as needed.

## 2020-10-10 ENCOUNTER — Ambulatory Visit: Payer: Medicaid Other | Admitting: Family Medicine

## 2020-11-07 MED FILL — PROGESTERONE 200 MG CAPSULE: 200 | 28 days supply | Qty: 10 | Fill #1

## 2020-12-24 ENCOUNTER — Other Ambulatory Visit: Payer: Self-pay

## 2020-12-24 ENCOUNTER — Telehealth: Payer: Self-pay

## 2020-12-24 MED FILL — Progesterone Cap 200 MG: ORAL | 28 days supply | Qty: 10 | Fill #0 | Status: CN

## 2020-12-24 NOTE — Telephone Encounter (Signed)
Patient's daughter calls nurse line regarding patient having possible allergies from work. Daughter reports that patient has noticed redness and itchiness to both hands. Daughter is unsure if she is wearing latex gloves at her job. Patient is also working with clothing and daughter is not sure if she may have an allergy to the different clothing types.   Daughter will call back with further clarification after she talks to her mother.   Veronda Prude, RN

## 2021-02-03 ENCOUNTER — Other Ambulatory Visit: Payer: Self-pay

## 2021-02-03 MED FILL — Progesterone Cap 200 MG: ORAL | 28 days supply | Qty: 10 | Fill #0 | Status: AC

## 2021-02-18 ENCOUNTER — Other Ambulatory Visit: Payer: Self-pay

## 2021-02-18 ENCOUNTER — Ambulatory Visit (INDEPENDENT_AMBULATORY_CARE_PROVIDER_SITE_OTHER): Payer: Medicaid Other | Admitting: Family Medicine

## 2021-02-18 VITALS — HR 72 | Resp 18

## 2021-02-18 DIAGNOSIS — R1013 Epigastric pain: Secondary | ICD-10-CM

## 2021-02-18 DIAGNOSIS — G8929 Other chronic pain: Secondary | ICD-10-CM

## 2021-02-18 DIAGNOSIS — R112 Nausea with vomiting, unspecified: Secondary | ICD-10-CM

## 2021-02-18 LAB — POCT URINALYSIS DIP (MANUAL ENTRY)
Bilirubin, UA: NEGATIVE
Blood, UA: NEGATIVE
Glucose, UA: NEGATIVE mg/dL
Ketones, POC UA: NEGATIVE mg/dL
Leukocytes, UA: NEGATIVE
Nitrite, UA: NEGATIVE
Protein Ur, POC: NEGATIVE mg/dL
Spec Grav, UA: 1.025 (ref 1.010–1.025)
Urobilinogen, UA: 0.2 E.U./dL
pH, UA: 6 (ref 5.0–8.0)

## 2021-02-18 MED ORDER — ONDANSETRON HCL 8 MG PO TABS
8.0000 mg | ORAL_TABLET | Freq: Three times a day (TID) | ORAL | 0 refills | Status: DC | PRN
  Filled 2021-02-19: qty 20, 7d supply, fill #0

## 2021-02-18 MED ORDER — OMEPRAZOLE 40 MG PO CPDR
40.0000 mg | DELAYED_RELEASE_CAPSULE | Freq: Every day | ORAL | 3 refills | Status: DC
Start: 2021-02-18 — End: 2021-06-22
  Filled 2021-02-19 – 2021-03-11 (×2): qty 30, 30d supply, fill #0
  Filled 2021-04-03: qty 30, 30d supply, fill #1

## 2021-02-18 NOTE — Progress Notes (Signed)
[facility] Telemedicine Visit  I connected with Deborah Chandler on 02/18/21 at 10:30 AM EDT by a video enabled telemedicine application and verified that I am speaking with the correct person using two identifiers.     I discussed the limitations of evaluation and management by telemedicine and the availability of in person appointments.  I discussed that the purpose of this telehealth visit is to provide medical care while limiting exposure to the novel coronavirus.  The patient and family expressed understanding and agreed to proceed.  Patient consented to have virtual visit. Method of visit: Video  Encounter participants: Patient: Deborah Chandler - located at car Provider: Joana Reamer - located at [facility] Others (if applicable): Son  Chief Complaint: Left upper quadrant abdominal pain  HPI: LUQ abdominal pain and burning sensation at epigastric area x 3 weeks. This pain is intermittent where she will have some days without pain. She endorses vomiting when she had the pain yesterday. She notes that she vomiting occurs first then she has the abdominal pain. This has been going on for 3 weeks. She notes the vomiting is a lot. Endorses possible small amount of blood tinged vomit yesterday. She notes that she has been vomiting every time she eats or drinks anything including water. She had bowel movement yesterday - brown and soft. Denies any sick contacts. Peeing normally. Denies hematuria. Denies any recent travel. Denies fevers or chills. She persistently states she has been unable to tolerate PO for 3 weeks straight.    She notes that she has not been taking any type of antacid. She does get nausea prior to her vomiting and some burning pain and sour taste that radiates upward.   LMP 01/21/21  Meds tried: Tylenol  ROS: per HPI  Pertinent PMHx: chronic epigastric pain, constipation, dyspepsia with prior negative H.Pylori, LUQ abdominal  tenderness  Prior Imaging: CT abd Pelvis 12/2016 for LUQ pain, N/V IMPRESSION: No acute findings in the abdomen/pelvis.   3.6 cm left ovarian cyst.  Pelvic US 11/2019 for pelvic pain: IMPRESSION: 1. Findings suggestive of uterine adenomyosis, similar to previous. 2. Otherwise normal pelvic ultrasound for age. No other acute abnormality identified.  Exam:  Pulse 72, resp. rate 18, last menstrual period 01/21/2021.  Gen: well appearing older woman sitting comfortably in front passenger car seat HEENT: oropharynx clear without erythema or exudate, MMM CV: RRR, no M/R/G, 2+ radial pulses bilaterally, <2 sec cap refill Respiratory: speaking in full sentences, breathing comfortably on room air, CTAB Abd: nontender to palpation, normoactive bowel sounds  Assessment/Plan:  Nausea and vomiting Nausea and vomiting x 3 weeks with subsequent epigastric/LUQ pain. Endorses inability to tolerate PO throughout this duration however she appears well hydrated and well appearing on exam. She is hemodynamically stable with no signs of acute abdomen. No diarrhea and normal bowel movement yesterday makes infection and obstruction less likely. She has had similar symptoms in past with normal CT abdomen/pelvis and treated with antacids with improvement in symptoms. At this time, I do not feel ED evaluation is warranted. I will obtain labs and treat symptomatically with great emphasis on good hydration. Work note provided to allow patient days home to recover. Strict return precautions discussed. Recommended follow up with PCP in 3-5 days to ensure improvement. - CBC with diff, lipase, CMP, UA - Zofran PRN. No history of prolonged QT - Omeprazole 40mg  QD - follow up as above      I discussed the assessment and treatment  plan with the patient and/or parent/guardian. They were provided an opportunity to ask questions and all were answered. They agreed with the plan and demonstrated an understanding of the  instructions.   They were advised to call back or seek an in-person evaluation in the emergency room if the symptoms worsen or if the condition fails to improve as anticipated.  Total time: 45 minutes. This includes time spent with the patient during the visit as well as time spent before and after the visit reviewing the chart, documenting the encounter, making phone calls, reviewing studies, etc.  Due to language barrier, an interpreter was present during the history-taking and subsequent discussion (and for part of the physical exam) with this patient. Spanish interpreter present.  Joana Reamer, DO  [facility], PGY3 02/18/2021 11:42 AM

## 2021-02-18 NOTE — Assessment & Plan Note (Signed)
Nausea and vomiting x 3 weeks with subsequent epigastric/LUQ pain. Endorses inability to tolerate PO throughout this duration however she appears well hydrated and well appearing on exam. She is hemodynamically stable with no signs of acute abdomen. No diarrhea and normal bowel movement yesterday makes infection and obstruction less likely. She has had similar symptoms in past with normal CT abdomen/pelvis and treated with antacids with improvement in symptoms. At this time, I do not feel ED evaluation is warranted. I will obtain labs and treat symptomatically with great emphasis on good hydration. Work note provided to allow patient days home to recover. Strict return precautions discussed. Recommended follow up with PCP in 3-5 days to ensure improvement. - CBC with diff, lipase, CMP, UA - Zofran PRN. No history of prolonged QT - Omeprazole 40mg  QD - follow up as above

## 2021-02-18 NOTE — Patient Instructions (Addendum)
Tome Zofran cada 8 horas segn sea necesario para los vmitos y las nuseas. Tome omeprazol 40 mg al da para su dolor abdominal. Por favor, asegrese de mantenerse muy bien hidratado. Trate de beber tanta agua como pueda. Tambin puede [employer]. Haga un seguimiento a principios de la prxima semana con su PCP, el Dr. Dareen Piano, para asegurarse de que las cosas estn mejorando. Te he facilitado [place] nota de [place] de [address] Si sus sntomas persisten o empeoran, informe a la sala de emergencias.    Please take Zofran every 8 hours as needed for vomiting and nausea. Please take omeprazole 40mg  daily for your abdominal pain Please be sure to stay very well hydrated. Try to drink as much water as you can. You can also pick up Pedialyte.  Please follow up early next week with your PCP, Dr. to make sure things are improving.  I have provided you a work note for a few days. If your symptoms persists or worsen please report to the emergency room.  Take care,  Dr. Dareen Piano

## 2021-02-19 ENCOUNTER — Encounter: Payer: Self-pay | Admitting: Family Medicine

## 2021-02-19 ENCOUNTER — Other Ambulatory Visit: Payer: Self-pay

## 2021-02-19 LAB — CBC WITH DIFFERENTIAL/PLATELET
Basophils Absolute: 0 10*3/uL (ref 0.0–0.2)
Basos: 1 %
EOS (ABSOLUTE): 0.2 10*3/uL (ref 0.0–0.4)
Eos: 2 %
Hematocrit: 40.3 % (ref 34.0–46.6)
Hemoglobin: 12.7 g/dL (ref 11.1–15.9)
Immature Grans (Abs): 0 10*3/uL (ref 0.0–0.1)
Immature Granulocytes: 0 %
Lymphocytes Absolute: 3.5 10*3/uL — ABNORMAL HIGH (ref 0.7–3.1)
Lymphs: 43 %
MCH: 24 pg — ABNORMAL LOW (ref 26.6–33.0)
MCHC: 31.5 g/dL (ref 31.5–35.7)
MCV: 76 fL — ABNORMAL LOW (ref 79–97)
Monocytes Absolute: 0.5 10*3/uL (ref 0.1–0.9)
Monocytes: 6 %
Neutrophils Absolute: 4 10*3/uL (ref 1.4–7.0)
Neutrophils: 48 %
Platelets: 299 10*3/uL (ref 150–450)
RBC: 5.29 x10E6/uL — ABNORMAL HIGH (ref 3.77–5.28)
RDW: 13.5 % (ref 11.7–15.4)
WBC: 8.2 10*3/uL (ref 3.4–10.8)

## 2021-02-19 LAB — COMPREHENSIVE METABOLIC PANEL
ALT: 13 IU/L (ref 0–32)
AST: 9 IU/L (ref 0–40)
Albumin/Globulin Ratio: 1.5 (ref 1.2–2.2)
Albumin: 4.1 g/dL (ref 3.8–4.8)
Alkaline Phosphatase: 82 IU/L (ref 44–121)
BUN/Creatinine Ratio: 13 (ref 9–23)
BUN: 10 mg/dL (ref 6–20)
Bilirubin Total: 0.6 mg/dL (ref 0.0–1.2)
CO2: 22 mmol/L (ref 20–29)
Calcium: 9.1 mg/dL (ref 8.7–10.2)
Chloride: 103 mmol/L (ref 96–106)
Creatinine, Ser: 0.75 mg/dL (ref 0.57–1.00)
Globulin, Total: 2.7 g/dL (ref 1.5–4.5)
Glucose: 96 mg/dL (ref 65–99)
Potassium: 4.5 mmol/L (ref 3.5–5.2)
Sodium: 141 mmol/L (ref 134–144)
Total Protein: 6.8 g/dL (ref 6.0–8.5)
eGFR: 104 mL/min/{1.73_m2} (ref >59)

## 2021-02-19 LAB — LIPASE: Lipase: 26 U/L (ref 14–72)

## 2021-02-19 NOTE — Progress Notes (Signed)
.   Una gran noticia. Tus laboratorios son normales! Espero que tus sntomas estn mejorando. Por favor, haga un seguimiento si persisten. Le enviar una copia de sus laboratorios por correo.

## 2021-02-26 ENCOUNTER — Other Ambulatory Visit: Payer: Self-pay

## 2021-03-11 ENCOUNTER — Other Ambulatory Visit: Payer: Self-pay

## 2021-03-11 MED FILL — Progesterone Cap 200 MG: ORAL | 28 days supply | Qty: 10 | Fill #1 | Status: AC

## 2021-03-12 ENCOUNTER — Other Ambulatory Visit: Payer: Self-pay

## 2021-04-03 ENCOUNTER — Other Ambulatory Visit: Payer: Self-pay

## 2021-04-03 MED FILL — Progesterone Cap 200 MG: ORAL | 28 days supply | Qty: 10 | Fill #2 | Status: AC

## 2021-04-06 ENCOUNTER — Other Ambulatory Visit: Payer: Self-pay

## 2021-04-10 ENCOUNTER — Other Ambulatory Visit: Payer: Self-pay

## 2021-04-15 ENCOUNTER — Ambulatory Visit (INDEPENDENT_AMBULATORY_CARE_PROVIDER_SITE_OTHER): Payer: Self-pay | Admitting: Family Medicine

## 2021-04-15 ENCOUNTER — Other Ambulatory Visit: Payer: Self-pay

## 2021-04-15 DIAGNOSIS — T675XXA Heat exhaustion, unspecified, initial encounter: Secondary | ICD-10-CM | POA: Insufficient documentation

## 2021-04-15 HISTORY — DX: Heat exhaustion, unspecified, initial encounter: T67.5XXA

## 2021-04-15 NOTE — Patient Instructions (Signed)
It was a pleasure to see you today!  I think your symptoms are due to heat exhaustion. It is important to stay hydrated with water or gatorade 1-1.5 liters per day. If you still are feeling sick even when the heat decreases, it is important to follow up. If your job does not give you opportunities to hydrate, please let me know. You can call our office at [phone].   Be Well,  Dr. Ruthe Mannan un placer verte hoy!  Creo que sus sntomas se deben al agotamiento por calor. Es importante mantenerse hidratado con agua o gatorade 1-1.5 litros por Futures trader. Si todava se siente enfermo incluso cuando el calor disminuye, es importante que haga un seguimiento. Si su trabajo no le brinda oportunidades para hidratarse, hgamelo saber. Puede llamar a nuestra oficina al [phone].   Que estes bien,  [employer]

## 2021-04-15 NOTE — Assessment & Plan Note (Signed)
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  The last few weeks have been the hottest of the year, heat index well above 100 degrees on a daily basis for the last two weeks.  Since these events only happen while working in a [employer], and she feels immediately better when she is able to rest and hydrate, I have high suspicion for heat exhaustion.  Patient has lost 20 pounds since February of this year, she reports this was not intentional, but she has not been using any medications or over-the-counter supplements for this.  I wrote a note recommending that patient be able to take breaks to hydrate at work.  If these recommendations are not taken to count, I will make an OSHA report.  If patient continues to have these events or these do not get better when the weather cools down, I recommend she return for further evaluation.  I gave patient return precautions, she is in agreement with plan.

## 2021-04-15 NOTE — Progress Notes (Signed)
SUBJECTIVE:   CHIEF COMPLAINT / HPI: lightheadedness and nausea  Lightheadedness and nausea: Patient seen in June for n/v and GERD symptoms for 3 weeks with LUQ and epigastric pain. She had a CBC, CMP, lipase, and UA that were all WNL at that time. Since then she reports she has had 3 more instances of feeling lightheaded, nausea and vomiting, and her vision going white.  All of these events have occurred while she is at work.  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  She works on a factory belt, and does not have time to stop and drink water when feeling poorly.  She says she has to get approval from her managers to be will to do this because the conveyor belt will keep rolling, and frequently the managers are not available for her to ask and she is not allowed to step away from the line.  Her symptoms start a few hours into her shift and get worse over time.  Shift started around 4 PM and go till 3 AM.  She says the factory remains hot even at night.  When she is feeling poorly afterwards she will go home, drink water, rest and within few hours she feels completely better.  She has not had full fainting or syncopal event, but has experienced near syncope during each event.  This has never happened to her before and has never occurred in any other setting other than working in this factory.  Patient reports that her symptoms she had in June improved with omeprazole.  PERTINENT  PMH / PSH: GERD  OBJECTIVE:   BP 120/82   Pulse 84   Ht 5\' 3"  (1.6 m)   Wt 236 lb 8 oz (107.3 kg)   LMP 03/25/2021   SpO2 98%   BMI 41.89 kg/m   Nursing note and vitals reviewed GEN: Appears stated age, Latina woman, resting comfortably in chair, NAD, morbidly obese, alert and at baseline HEENT: NCAT. PERRLA. Sclera without injection or icterus. MMM.  Neck: Supple.  No LAD. Cardiac: Regular rate and rhythm. Normal S1/S2.  No murmurs, rubs, or gallops appreciated. 2+ radial pulses. Lungs: Clear bilaterally to ascultation. No increased WOB, no accessory muscle usage. No w/r/r. Abdomen: Normoactive bowel sounds. No tenderness to deep or light palpation. No rebound or guarding.  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  The last few weeks have been the hottest of the year, heat index well above 100 degrees on a daily basis for the last two weeks.  Since these events only happen while working in a [employer], and she feels immediately better when she is able to rest and hydrate, I have high suspicion for heat exhaustion.  Patient has lost 20 pounds since February of this year, she reports this was not intentional, but she has not been using any medications or over-the-counter supplements for this.  I wrote a note recommending that patient be able to take breaks to hydrate at work.  If these recommendations are not taken to count, I will make an OSHA report.  If patient continues to have these events or these do not get better when the weather cools down, I recommend she  return for further evaluation.  I gave patient return precautions, she is in agreement with plan.     Shirlean Mylar, MD [facility]

## 2021-04-17 ENCOUNTER — Telehealth: Payer: Self-pay

## 2021-04-17 DIAGNOSIS — G8929 Other chronic pain: Secondary | ICD-10-CM

## 2021-04-17 DIAGNOSIS — R112 Nausea with vomiting, unspecified: Secondary | ICD-10-CM

## 2021-04-17 DIAGNOSIS — R1013 Epigastric pain: Secondary | ICD-10-CM

## 2021-04-17 NOTE — Telephone Encounter (Signed)
Patient's daughter, Drue Second, calls nurse line regarding visit on 8/10. Patient reports that she and provider had discussed a medication for dehydration.   Informed daughter of provider recommendations, including increasing PO fluids and letter for patient to have breaks at work.   Daughter is requesting that I ask provider about medication discussed during the visit.   Will forward to Dr. Leary Roca.   Veronda Prude, RN

## 2021-04-20 MED ORDER — ONDANSETRON HCL 8 MG PO TABS: 8.0000 mg | ORAL_TABLET | Freq: Three times a day (TID) | ORAL | 0 refills | Status: DC | PRN

## 2021-04-20 NOTE — Telephone Encounter (Signed)
Patient has had head exhaustion and nausea. There is not medication for heat exhaustion, other than rehydration, which patient is able to do orally. The only medication I can think of that daughter might be referencing is zofran for nausea, which patient had. I can send in a refill for this, but it will not treat the underlying problem of heat exhaustion, only decreases symptoms of nausea.  Shirlean Mylar, MD [facility], PGY-3

## 2021-05-01 ENCOUNTER — Ambulatory Visit (INDEPENDENT_AMBULATORY_CARE_PROVIDER_SITE_OTHER): Payer: Self-pay | Admitting: Student

## 2021-05-01 ENCOUNTER — Telehealth: Payer: Self-pay | Admitting: Student

## 2021-05-01 ENCOUNTER — Other Ambulatory Visit: Payer: Self-pay

## 2021-05-01 ENCOUNTER — Encounter: Payer: Self-pay | Admitting: Student

## 2021-05-01 VITALS — BP 161/94 | HR 84 | Wt 239.6 lb

## 2021-05-01 DIAGNOSIS — R112 Nausea with vomiting, unspecified: Secondary | ICD-10-CM

## 2021-05-01 NOTE — Patient Instructions (Signed)
It was great to see you! Thank you for allowing me to participate in your care!  I recommend that you always bring your medications to each appointment as this makes it easy to ensure you are on the correct medications and helps [place] not miss when refills are needed.  Our plans for today:  -I have written you a note to excuse you from work for 1 full week to see if you still have your symptoms when you are away from work -I have sent in an order for you to have a CT done of your head.  Someone will call you to schedule this appointment -Please schedule an appointment for 1 week from now after you have not been at work -Continue to stay hydrated with water and Gatorade -If your vomiting increases or if you have more blood in your vomit, please seek help sooner at the emergency department -If you begin to have vision changes and feel like you might pass out, please seek help at the emergency department   Take care and seek immediate care sooner if you develop any concerns.   Dr. Erick Alley, DO [facility]

## 2021-05-01 NOTE — Telephone Encounter (Signed)
Please call this patient Monday to have a head CT scheduled within a week.  Thank you!

## 2021-05-01 NOTE — Progress Notes (Signed)
    SUBJECTIVE:   CHIEF COMPLAINT / HPI: follow up for nausea, vomiting, lightheadedness  PERTINENT  PMH / PSH: IBS, GERD, AUB  Patient was seen on 02/18/2021 and on 04/15/2021 for lightheadedness, nausea, and vomiting which occurs when she is at work.  In June she had a CBC, CMP, lipase, and UA that were all WNL.  She was prescribed omeprazole which seemed to help her symptoms for a short time.  But then she continued to have nausea, vomiting and lightheadedness.  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  She was advised to hydrate and take breaks when needed.  She was also prescribed Zofran which she states did not help.  Patient states her symptoms have not improved.  She experienced lightheadedness and vomited 3 times on 08/24 at work.  She noticed a small amount of dark red blood mixed in with the vomit the first time. She also spit up about a teaspoon of blood after she finished vomiting.  She has not noticed any blood since then.  She started vomiting at 12:45 until 1 am early this morning.  She states she also has a slight headache when her symptoms occur.  She denies any changes in vision or changes in hearing. Once she starts vomiting at work she cannot eat. Still feels a little bad at home but does not vomit or feel dizzy at home.  No one else at work has the symptoms. States her side/ribs hurting for the past 2 weeks and rates her pain at a 9/10 at its worst.  States the pain is at its worst when she is vomiting.  She has lost about 20 lbs in the past 6 months, not trying to lose weight.  She is very concerned about her health and was tearful when explaining her symptoms to me.   OBJECTIVE:   BP (!) 161/94   Pulse 84   Wt 239 lb 9.6 oz (108.7 kg)   SpO2 91%   BMI 42.44 kg/m    General: NAD, able to participate in exam Cardiac: RRR, no murmurs. Respiratory: CTAB, normal effort, No wheezes, rales or  rhonchi Abdomen: Bowel sounds present, nontender, nondistended, no hepatosplenomegaly. Neuro: alert, cranial nerves II through XII intact, sensation intact, normal finger-to-nose test Psych: Mood and affect appropriate for situation, slightly tearful during exam  ASSESSMENT/PLAN:   Considered carbon monoxide poisoning as a possible explanation for her symptoms as they only occur when she is at work.  However, since she states no one else she works with seems to have these symptoms this is unlikely.  Because abdominal exam was normal today, will not further explore GI causes at this time.  Because her symptoms are associated with lightheadedness and headaches, and she is having daily nausea and vomiting for months, will obtain a head CT to rule out neoplasm, hematoma, other intracranial pathology.  Advised patient to stay out of work for 1 full week to see if her symptoms improve to determine if they are related to her workplace. -Head CT ordered -Letter provided for patient's employer to keep her out of work for 1 week - follow-up in 1 week -CMP at follow-up visit if still having regular vomiting   Dr. Erick Alley, DO [place]

## 2021-05-05 ENCOUNTER — Other Ambulatory Visit: Payer: Self-pay

## 2021-05-05 MED FILL — Progesterone Cap 200 MG: ORAL | 28 days supply | Qty: 10 | Fill #3 | Status: AC

## 2021-05-12 ENCOUNTER — Ambulatory Visit (HOSPITAL_COMMUNITY)
Admission: RE | Admit: 2021-05-12 | Discharge: 2021-05-12 | Disposition: A | Discharge status: Home / Self Care | Payer: Self-pay | Source: Ambulatory Visit | Attending: Family Medicine | Admitting: Family Medicine

## 2021-05-12 ENCOUNTER — Other Ambulatory Visit: Payer: Self-pay

## 2021-05-12 ENCOUNTER — Telehealth: Payer: Self-pay

## 2021-05-12 DIAGNOSIS — R112 Nausea with vomiting, unspecified: Secondary | ICD-10-CM | POA: Insufficient documentation

## 2021-05-12 NOTE — Telephone Encounter (Signed)
Patients daughter LVM on nurse line requesting to call her mother back with an interpreter. I attempted to call patient using pacific interpreters, however no answer.   Patient does have a scheduled apt for tomorrow.

## 2021-05-13 ENCOUNTER — Ambulatory Visit (INDEPENDENT_AMBULATORY_CARE_PROVIDER_SITE_OTHER): Payer: Self-pay | Admitting: Family Medicine

## 2021-05-13 ENCOUNTER — Encounter: Payer: Self-pay | Admitting: Family Medicine

## 2021-05-13 ENCOUNTER — Other Ambulatory Visit: Payer: Self-pay

## 2021-05-13 VITALS — BP 122/84 | HR 72 | Ht 63.0 in | Wt 236.0 lb

## 2021-05-13 DIAGNOSIS — N939 Abnormal uterine and vaginal bleeding, unspecified: Secondary | ICD-10-CM

## 2021-05-13 DIAGNOSIS — R112 Nausea with vomiting, unspecified: Secondary | ICD-10-CM

## 2021-05-13 MED ORDER — FLUOXETINE HCL 20 MG PO CAPS
20.0000 mg | ORAL_CAPSULE | Freq: Every day | ORAL | 1 refills | Status: DC
  Filled 2021-05-13: qty 30, 30d supply, fill #0

## 2021-05-13 NOTE — Patient Instructions (Addendum)
I am trying you on fluoxetine for your nausea, vomiting and headaches. Fluoxetine is the name of the medicine and it  is sometimes used for this. I will start you on a low dose once a day and have you see  your regular doctor back in 2-3 weeks.  Your CT scan was normal. I have attached the report.

## 2021-05-14 ENCOUNTER — Encounter: Payer: Self-pay | Admitting: Family Medicine

## 2021-05-14 ENCOUNTER — Telehealth: Payer: Self-pay

## 2021-05-14 NOTE — Telephone Encounter (Signed)
Patient's daughter calls nurse line regarding concerns with rx for Prozac. Daughter reports that pharmacist told her that this was being prescribed for depression. Per OV note, it appears this is being prescribed for nausea, vomiting and headaches.   Daughter also reports that she was reading on side effects of this medication and had concerns about mother starting due to these side effects and would like to speak with provider further regarding this.   Please return call to daughter at [phone].   Veronda Prude, RN

## 2021-05-14 NOTE — Progress Notes (Signed)
    CHIEF COMPLAINT / HPI: 2 interpreters used for the entire visit. #1.  Here for test results about her CT scan which was done yesterday.  This is part of her work-up for some chronic vomiting issues she has been having.  Since she is not been at work for the last 5 days, she has not had any vomiting episodes or nausea. 2.  Since starting on the Provera for dysfunctional uterine bleeding, she has had 13 days of relatively heavy bleeding.  She is quite unhappy about this.  She also has Nexplanon in place.  She said the medicine made it worse if anything.  She is having a lot of cramping in her back like she usually does with her periods   PERTINENT  PMH / PSH: I have reviewed the patient's medications, allergies, past medical and surgical history, smoking status and updated in the EMR as appropriate.   OBJECTIVE:  BP 122/84   Pulse 72   Ht 5\' 3"  (1.6 m)   Wt 236 lb (107 kg)   LMP 05/03/2021 (Exact Date) Comment: pt sts she started period 05/01/21 and has been bleeding for 14 days  SpO2 98%   BMI 41.81 kg/m   GENERAL: Well-developed female, no acute distress.  BMI 41. CV: Regular rate and rhythm ABDOMEN: Soft  Imaging: CT scan: I had to call radiology and had them read it but the result was negative.  I gave her copy of this. ASSESSMENT / PLAN:   Abnormal uterine bleeding (AUB) This a little hard to get a clear picture of the entire work-up but she is quite frustrated.  Looks like she has been on Nexplanon with the addition of oral progesterone without any success.  I do not see that she has had pelvic ultrasound or endometrial biopsy.  I think the best course of action would be to refer her to gynecology for further evaluation and work-up.  Referral was placed  Nausea and vomiting I reviewed the prior notes on her nausea and vomiting.  It does seem like it is isolated to the work area.  Notably she did have almost complete cessation of the symptoms when she was out of work for the  last 5 days.  She wants to go back to work so have given her a letter to return to work.  When if there is some social anxiety component to this.  Given the large preponderance of serotonin receptors in the gut, I will try her on low-dose SSRI to see if this helps.  She will follow-up with her PCP in 2 to 3 weeks.  Prescription for fluoxetine given.  I did tell her this was sometimes used for off label uses such as this but that was also used for symptoms of depression or anxiety or peripheral neuropathy.  This was discussed with her in detail and she seemed accepting.  All conversation was held with interpreters.   05/03/21 MD

## 2021-05-14 NOTE — Assessment & Plan Note (Signed)
This a little hard to get a clear picture of the entire work-up but she is quite frustrated.  Looks like she has been on Nexplanon with the addition of oral progesterone without any success.  I do not see that she has had pelvic ultrasound or endometrial biopsy.  I think the best course of action would be to refer her to gynecology for further evaluation and work-up.  Referral was placed

## 2021-05-14 NOTE — Assessment & Plan Note (Signed)
I reviewed the prior notes on her nausea and vomiting.  It does seem like it is isolated to the work area.  Notably she did have almost complete cessation of the symptoms when she was out of work for the last 5 days.  She wants to go back to work so have given her a letter to return to work.  When if there is some social anxiety component to this.  Given the large preponderance of serotonin receptors in the gut, I will try her on low-dose SSRI to see if this helps.  She will follow-up with her PCP in 2 to 3 weeks.  Prescription for fluoxetine given.  I did tell her this was sometimes used for off label uses such as this but that was also used for symptoms of depression or anxiety or peripheral neuropathy.  This was discussed with her in detail and she seemed accepting.  All conversation was held with interpreters.

## 2021-05-20 NOTE — Telephone Encounter (Signed)
Daughter returns call to nurse line. Informed daughter of HIPPA regulations. Daughter is requesting that provider return call to patient to discuss medication further.   Please advise.   Veronda Prude, RN

## 2021-06-11 ENCOUNTER — Ambulatory Visit (INDEPENDENT_AMBULATORY_CARE_PROVIDER_SITE_OTHER): Payer: Self-pay | Admitting: Student

## 2021-06-11 ENCOUNTER — Other Ambulatory Visit: Payer: Self-pay

## 2021-06-11 ENCOUNTER — Encounter: Payer: Self-pay | Admitting: Student

## 2021-06-11 ENCOUNTER — Other Ambulatory Visit (HOSPITAL_COMMUNITY)
Admission: RE | Admit: 2021-06-11 | Discharge: 2021-06-11 | Disposition: A | Discharge status: Home / Self Care | Payer: Medicaid Other | Source: Ambulatory Visit | Attending: Family Medicine | Admitting: Family Medicine

## 2021-06-11 VITALS — BP 129/73 | HR 72 | Wt 238.8 lb

## 2021-06-11 DIAGNOSIS — N898 Other specified noninflammatory disorders of vagina: Secondary | ICD-10-CM | POA: Insufficient documentation

## 2021-06-11 DIAGNOSIS — Z23 Encounter for immunization: Secondary | ICD-10-CM

## 2021-06-11 LAB — POCT WET PREP (WET MOUNT)
Clue Cells Wet Prep Whiff POC: POSITIVE
Trichomonas Wet Prep HPF POC: ABSENT

## 2021-06-11 MED ORDER — METRONIDAZOLE 0.75 % VA GEL
1.0000 | Freq: Every day | VAGINAL | 0 refills | Status: AC
  Filled 2021-06-11: qty 70, 5d supply, fill #0
  Filled 2021-06-23: qty 70, 7d supply, fill #0

## 2021-06-11 NOTE — Patient Instructions (Addendum)
It was great to see you! Thank you for allowing me to participate in your care!  I recommend that you always bring your medications to each appointment as this makes it easy to ensure you are on the correct medications and helps [place] not miss when refills are needed.  Our plans for today:  - You can use an over the counter vaginal moisturizer called Replens for your irritation and itchiness. This can be found at any drug store. -You tested positive today for bacterial vaginosis, a common infection found in the vagina.  This is not a sexually transmitted disease.  I have prescribed you an antibiotic called metronidazole which you can pick up at your pharmacy.    We are checking some labs today, I will call you if they are abnormal will send you a MyChart message or a letter if they are normal.  If you do not hear about your labs in the next 2 weeks please let us know.  Take care and seek immediate care sooner if you develop any concerns.   Dr. Erick Alley, DO [facility]

## 2021-06-11 NOTE — Progress Notes (Signed)
    SUBJECTIVE:   CHIEF COMPLAINT / HPI: Vaginal irritation/pruritis   PERTINENT  PMH / PSH: IBS, GERD, AUB  Vaginal irritation Pt states for the past 5-6 days she feels like something is coming out of her vagina when walking.  She also notes it feels irritated and itches.  She denies increased discharge but admits to a fishy odor.   She states she has a vaginal lump she was supposed to have surgery on but never did.  Hx of nausea and vomiting She did not take the fluoxitine prescribed at her last visit because she was fired from her job.  As her symptoms of nausea and vomiting were only occurring at work, she is not having the symtoms anymore.   OBJECTIVE:    General: NAD, pleasant, able to participate in exam Respiratory: Normal respiratory effort Genitourinary: Chaperoned by CMA. Normal external genitalia, no lesions, ulcers, rashes on skin. Hard to visualize cervix as vaginal wall seems to collapse into speculum.  Off-white discharge present.  Upon manual exam, when patient is asked to bear down, I can feel her uterus protruding into the tip of my fingers and the posterior vaginal wall protruding upward. Skin: warm and dry, no rashes noted Neuro: alert, no obvious focal deficits Psych: Normal affect and mood  ASSESSMENT/PLAN:   Vaginal irritation Wet prep positive for BV.  Gonorrhea and Chlamydia test pending.  As it was too late for the lab to draw blood, a future lab order for HIV and syphilis has been placed.  I suspect patient has a uterine prolapse and possible rectocele.  I suspect this vaginal prolapse is what is causing her to feel like something is falling out of her vagina and is the cause of her irritation.  She also states she has a mass in her vagina that she is supposed to have surgically removed.  Last office visit note with Dr. Lloyd Huger states she had a menstrual period last month which lasted for 14 days.  She was referred to OB/GYN but never received a phone call.   Today she was provided with the phone number so that she may call to make the appointment herself. -Metronidazole vaginal applicator daily for 5 days -Advised to get Replens vaginal moisturizer for irritation and itchiness -Follow up with OB/GYN  Patient received her flu vaccine today  Dr. Erick Alley, DO [facility]

## 2021-06-12 ENCOUNTER — Other Ambulatory Visit: Payer: Self-pay

## 2021-06-15 ENCOUNTER — Encounter: Payer: Self-pay | Admitting: Student

## 2021-06-15 LAB — CERVICOVAGINAL ANCILLARY ONLY
Chlamydia: NEGATIVE
Comment: NEGATIVE
Comment: NORMAL
Neisseria Gonorrhea: NEGATIVE

## 2021-06-15 NOTE — Progress Notes (Signed)
Letter sent with negative CG and chlamydia test results.

## 2021-06-19 ENCOUNTER — Other Ambulatory Visit: Payer: Self-pay

## 2021-06-22 ENCOUNTER — Telehealth: Payer: Self-pay

## 2021-06-22 ENCOUNTER — Ambulatory Visit (HOSPITAL_COMMUNITY)
Admission: EM | Admit: 2021-06-22 | Discharge: 2021-06-22 | Disposition: A | Discharge status: Home / Self Care | Payer: Medicaid Other | Attending: Emergency Medicine | Admitting: Emergency Medicine

## 2021-06-22 ENCOUNTER — Encounter (HOSPITAL_COMMUNITY): Payer: Self-pay | Admitting: Emergency Medicine

## 2021-06-22 ENCOUNTER — Other Ambulatory Visit: Payer: Self-pay

## 2021-06-22 DIAGNOSIS — G8929 Other chronic pain: Secondary | ICD-10-CM

## 2021-06-22 DIAGNOSIS — Z20822 Contact with and (suspected) exposure to covid-19: Secondary | ICD-10-CM | POA: Insufficient documentation

## 2021-06-22 DIAGNOSIS — R1013 Epigastric pain: Secondary | ICD-10-CM

## 2021-06-22 DIAGNOSIS — K219 Gastro-esophageal reflux disease without esophagitis: Secondary | ICD-10-CM | POA: Insufficient documentation

## 2021-06-22 DIAGNOSIS — R0789 Other chest pain: Secondary | ICD-10-CM

## 2021-06-22 DIAGNOSIS — Z79899 Other long term (current) drug therapy: Secondary | ICD-10-CM | POA: Insufficient documentation

## 2021-06-22 DIAGNOSIS — R059 Cough, unspecified: Secondary | ICD-10-CM

## 2021-06-22 DIAGNOSIS — R112 Nausea with vomiting, unspecified: Secondary | ICD-10-CM

## 2021-06-22 DIAGNOSIS — Z881 Allergy status to other antibiotic agents status: Secondary | ICD-10-CM | POA: Insufficient documentation

## 2021-06-22 DIAGNOSIS — R600 Localized edema: Secondary | ICD-10-CM

## 2021-06-22 LAB — POC URINE PREG, ED: Preg Test, Ur: NEGATIVE

## 2021-06-22 MED ORDER — ONDANSETRON HCL 8 MG PO TABS
8.0000 mg | ORAL_TABLET | Freq: Three times a day (TID) | ORAL | 0 refills | Status: DC | PRN
  Filled 2021-06-22: qty 20, 7d supply, fill #0

## 2021-06-22 MED ORDER — OMEPRAZOLE 40 MG PO CPDR
40.0000 mg | DELAYED_RELEASE_CAPSULE | Freq: Every day | ORAL | 0 refills | Status: DC
  Filled 2021-06-22: qty 30, 30d supply, fill #0

## 2021-06-22 NOTE — Discharge Instructions (Addendum)
If your COVID test is positive, someone will call you with results and further instructions.  If your COVID test is negative, no one will call you.  You can check results in MyChart if you have a MyChart account.  Follow-up with your primary care provider at the family medicine center as soon as possible.

## 2021-06-22 NOTE — Telephone Encounter (Signed)
Patients daughter calls nurse line reporting nausea and chest pain x 3 days. Daughter reports the chest pain has been on and off since Friday and has been associated with "some" dizziness. Given symptoms and availability issues, daughter advised to take her to an UC or ED. See agreed with plan.

## 2021-06-22 NOTE — ED Triage Notes (Signed)
Symptoms started 11 days ago

## 2021-06-22 NOTE — ED Triage Notes (Signed)
PT reports cough, headache, abdominal pain, and vomiting.

## 2021-06-23 ENCOUNTER — Other Ambulatory Visit: Payer: Self-pay

## 2021-06-23 LAB — SARS CORONAVIRUS 2 (TAT 6-24 HRS): SARS Coronavirus 2: NEGATIVE

## 2021-06-24 ENCOUNTER — Other Ambulatory Visit: Payer: Self-pay

## 2021-06-25 ENCOUNTER — Emergency Department (HOSPITAL_COMMUNITY)
Admission: EM | Admit: 2021-06-25 | Discharge: 2021-06-25 | Disposition: A | Discharge status: Home / Self Care | Payer: Self-pay | Attending: Emergency Medicine | Admitting: Emergency Medicine

## 2021-06-25 ENCOUNTER — Other Ambulatory Visit: Payer: Self-pay

## 2021-06-25 ENCOUNTER — Emergency Department (HOSPITAL_COMMUNITY): Payer: Self-pay

## 2021-06-25 ENCOUNTER — Encounter (HOSPITAL_COMMUNITY): Payer: Self-pay

## 2021-06-25 DIAGNOSIS — R519 Headache, unspecified: Secondary | ICD-10-CM | POA: Insufficient documentation

## 2021-06-25 DIAGNOSIS — R1011 Right upper quadrant pain: Secondary | ICD-10-CM

## 2021-06-25 DIAGNOSIS — R0602 Shortness of breath: Secondary | ICD-10-CM | POA: Insufficient documentation

## 2021-06-25 DIAGNOSIS — R102 Pelvic and perineal pain: Secondary | ICD-10-CM | POA: Insufficient documentation

## 2021-06-25 DIAGNOSIS — E860 Dehydration: Secondary | ICD-10-CM | POA: Insufficient documentation

## 2021-06-25 DIAGNOSIS — K8050 Calculus of bile duct without cholangitis or cholecystitis without obstruction: Secondary | ICD-10-CM

## 2021-06-25 DIAGNOSIS — R1110 Vomiting, unspecified: Secondary | ICD-10-CM | POA: Insufficient documentation

## 2021-06-25 DIAGNOSIS — R1013 Epigastric pain: Secondary | ICD-10-CM | POA: Insufficient documentation

## 2021-06-25 DIAGNOSIS — K219 Gastro-esophageal reflux disease without esophagitis: Secondary | ICD-10-CM | POA: Insufficient documentation

## 2021-06-25 LAB — CBC WITH DIFFERENTIAL/PLATELET
Abs Immature Granulocytes: 0.01 10*3/uL (ref 0.00–0.07)
Basophils Absolute: 0 10*3/uL (ref 0.0–0.1)
Basophils Relative: 0 %
Eosinophils Absolute: 0.2 10*3/uL (ref 0.0–0.5)
Eosinophils Relative: 2 %
HCT: 41.8 % (ref 36.0–46.0)
Hemoglobin: 12.8 g/dL (ref 12.0–15.0)
Immature Granulocytes: 0 %
Lymphocytes Relative: 37 %
Lymphs Abs: 3.3 10*3/uL (ref 0.7–4.0)
MCH: 24.6 pg — ABNORMAL LOW (ref 26.0–34.0)
MCHC: 30.6 g/dL (ref 30.0–36.0)
MCV: 80.4 fL (ref 80.0–100.0)
Monocytes Absolute: 0.5 10*3/uL (ref 0.1–1.0)
Monocytes Relative: 6 %
Neutro Abs: 5 10*3/uL (ref 1.7–7.7)
Neutrophils Relative %: 55 %
Platelets: 312 10*3/uL (ref 150–400)
RBC: 5.2 MIL/uL — ABNORMAL HIGH (ref 3.87–5.11)
RDW: 14.7 % (ref 11.5–15.5)
WBC: 9.1 10*3/uL (ref 4.0–10.5)
nRBC: 0 % (ref 0.0–0.2)

## 2021-06-25 LAB — COMPREHENSIVE METABOLIC PANEL
ALT: 14 U/L (ref 0–44)
AST: 12 U/L — ABNORMAL LOW (ref 15–41)
Albumin: 3.6 g/dL (ref 3.5–5.0)
Alkaline Phosphatase: 56 U/L (ref 38–126)
Anion gap: 6 (ref 5–15)
BUN: 12 mg/dL (ref 6–20)
CO2: 27 mmol/L (ref 22–32)
Calcium: 8.7 mg/dL — ABNORMAL LOW (ref 8.9–10.3)
Chloride: 102 mmol/L (ref 98–111)
Creatinine, Ser: 0.59 mg/dL (ref 0.44–1.00)
GFR, Estimated: >60 mL/min (ref >60)
Glucose, Bld: 91 mg/dL (ref 70–99)
Potassium: 4 mmol/L (ref 3.5–5.1)
Sodium: 135 mmol/L (ref 135–145)
Total Bilirubin: 0.7 mg/dL (ref 0.3–1.2)
Total Protein: 7.2 g/dL (ref 6.5–8.1)

## 2021-06-25 LAB — URINALYSIS, ROUTINE W REFLEX MICROSCOPIC
Bilirubin Urine: NEGATIVE
Glucose, UA: NEGATIVE mg/dL
Ketones, ur: NEGATIVE mg/dL
Leukocytes,Ua: NEGATIVE
Nitrite: NEGATIVE
Protein, ur: NEGATIVE mg/dL
Specific Gravity, Urine: 1.004 — ABNORMAL LOW (ref 1.005–1.030)
pH: 5 (ref 5.0–8.0)

## 2021-06-25 LAB — LIPASE, BLOOD: Lipase: 35 U/L (ref 11–51)

## 2021-06-25 LAB — TROPONIN I (HIGH SENSITIVITY): Troponin I (High Sensitivity): 3 ng/L (ref <18)

## 2021-06-25 LAB — D-DIMER, QUANTITATIVE: D-Dimer, Quant: 1.23 ug/mL-FEU — ABNORMAL HIGH (ref 0.00–0.50)

## 2021-06-25 LAB — I-STAT BETA HCG BLOOD, ED (MC, WL, AP ONLY): I-stat hCG, quantitative: <5 m[IU]/mL (ref <5)

## 2021-06-25 MED ORDER — HYDROCODONE-ACETAMINOPHEN 5-325 MG PO TABS
1.0000 | ORAL_TABLET | Freq: Once | ORAL | Status: AC
  Administered 2021-06-25: 1 via ORAL
  Filled 2021-06-25: qty 1

## 2021-06-25 MED ORDER — HYDROCODONE-ACETAMINOPHEN 5-325 MG PO TABS
1.0000 | ORAL_TABLET | Freq: Four times a day (QID) | ORAL | 0 refills | Status: DC | PRN
  Filled 2021-06-25: qty 10, 3d supply, fill #0

## 2021-06-25 MED ORDER — METOCLOPRAMIDE HCL 5 MG/ML IJ SOLN
10.0000 mg | Freq: Once | INTRAMUSCULAR | Status: AC
  Administered 2021-06-25: 10 mg via INTRAVENOUS
  Filled 2021-06-25: qty 2

## 2021-06-25 MED ORDER — ONDANSETRON HCL 4 MG/2ML IJ SOLN
4.0000 mg | Freq: Once | INTRAMUSCULAR | Status: AC
  Administered 2021-06-25: 4 mg via INTRAVENOUS
  Filled 2021-06-25: qty 2

## 2021-06-25 MED ORDER — FAMOTIDINE IN NACL 20-0.9 MG/50ML-% IV SOLN
20.0000 mg | Freq: Once | INTRAVENOUS | Status: AC
  Administered 2021-06-25: 20 mg via INTRAVENOUS
  Filled 2021-06-25: qty 50

## 2021-06-25 MED ORDER — IOHEXOL 350 MG/ML SOLN
80.0000 mL | Freq: Once | INTRAVENOUS | Status: AC | PRN
  Administered 2021-06-25: 80 mL via INTRAVENOUS

## 2021-06-25 MED ORDER — SODIUM CHLORIDE 0.9 % IV BOLUS
1000.0000 mL | Freq: Once | INTRAVENOUS | Status: AC
  Administered 2021-06-25: 1000 mL via INTRAVENOUS

## 2021-06-25 MED ORDER — ONDANSETRON 4 MG PO TBDP: ORAL_TABLET | ORAL | 0 refills | Status: DC

## 2021-06-25 MED ORDER — DIPHENHYDRAMINE HCL 50 MG/ML IJ SOLN
12.5000 mg | Freq: Once | INTRAMUSCULAR | Status: AC
  Administered 2021-06-25: 12.5 mg via INTRAVENOUS
  Filled 2021-06-25: qty 1

## 2021-06-25 MED ORDER — HYDROCODONE-ACETAMINOPHEN 5-325 MG PO TABS: 1.0000 | ORAL_TABLET | Freq: Four times a day (QID) | ORAL | 0 refills | Status: DC | PRN
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
## 2021-06-25 NOTE — ED Notes (Signed)
Patient transported to X-ray 

## 2021-06-25 NOTE — Discharge Instructions (Signed)
You likely have gallstones causing your pain  Avoid fatty food.  Take Zofran for nausea and take Vicodin for pain.  You need to call surgery for follow-up to plan for gallbladder removal   Return to ER if you have worse abdominal pain, vomiting, fever

## 2021-06-25 NOTE — ED Notes (Signed)
Pt was able to ambulate with minimal assistance to restroom and back.

## 2021-06-25 NOTE — ED Triage Notes (Signed)
Pt presents with c/o vomiting and headache for approx 3 months, progressively getting worse. Pt also reports a cough.

## 2021-06-25 NOTE — ED Notes (Signed)
Pt was able to ambulate with minimal assistance to restroom and back. 

## 2021-06-26 ENCOUNTER — Ambulatory Visit: Payer: Medicaid Other

## 2021-06-26 NOTE — Progress Notes (Deleted)
    SUBJECTIVE:   CHIEF COMPLAINT / HPI:   Emesis/stomach pains: 40 year old female presenting for the above.  She states***.  She was seen in urgent care and was tested for COVID-19 which was negative.***  PERTINENT  PMH / PSH: ***  OBJECTIVE:   LMP 06/19/2021    General: NAD, pleasant, able to participate in exam HEENT: No pharyngeal erythema,*** Cardiac: RRR, no murmurs. Respiratory: CTAB, normal effort, No wheezes, rales or rhonchi Abdomen: Bowel sounds present, nontender Skin: warm and dry, no rashes noted  ASSESSMENT/PLAN:   No problem-specific Assessment & Plan notes found for this encounter.    Assessment: 40 y.o. female with symptoms consistent with a viral upper respiratory tract infection.  Patient does not have any concerning symptoms.  No shortness of breath***.  Overall differential can include seasonal allergies with postnasal drip leading to the cough and runny nose versus viral URI which can include common cold, influenza, COVID-19, or number of other respiratory viruses.  Unlikely bacterial at this time though the patient could potentially develop sinus infection due to congestion. Plan: -We will send for COVID-19 testing -Discussed return precautions -Discussed symptomatic treatment and the lack of need for antibiotics.  Jackelyn Poling, DO [facility]

## 2021-07-04 ENCOUNTER — Ambulatory Visit (HOSPITAL_COMMUNITY)
Admission: EM | Admit: 2021-07-04 | Discharge: 2021-07-04 | Disposition: A | Discharge status: Home / Self Care | Payer: Self-pay | Attending: Emergency Medicine | Admitting: Emergency Medicine

## 2021-07-04 ENCOUNTER — Other Ambulatory Visit: Payer: Self-pay

## 2021-07-04 ENCOUNTER — Encounter (HOSPITAL_COMMUNITY): Payer: Self-pay

## 2021-07-04 ENCOUNTER — Ambulatory Visit (INDEPENDENT_AMBULATORY_CARE_PROVIDER_SITE_OTHER): Payer: Self-pay

## 2021-07-04 DIAGNOSIS — K5900 Constipation, unspecified: Secondary | ICD-10-CM

## 2021-07-04 DIAGNOSIS — K5904 Chronic idiopathic constipation: Secondary | ICD-10-CM

## 2021-07-04 MED ORDER — LINACLOTIDE 145 MCG PO CAPS: 145.0000 ug | ORAL_CAPSULE | Freq: Every day | ORAL | 0 refills | Status: DC
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
## 2021-07-04 NOTE — ED Triage Notes (Signed)
Pt presents with constipation X 4 days. 

## 2021-07-04 NOTE — Discharge Instructions (Addendum)
Please begin Linzess, 1 capsule daily for constipation.  These follow-up with your primary care provider next week to discuss whether you should continue taking his medication at this dose or should be increased to high-dose

## 2021-07-13 ENCOUNTER — Ambulatory Visit: Payer: Medicaid Other | Admitting: Student

## 2021-07-13 NOTE — Progress Notes (Deleted)
    SUBJECTIVE:   CHIEF COMPLAINT / HPI:  Pt seen in ED on 06/25/21 for epigastric pain and vomiting. Was found to have cholelithiasis on U/S and recommended to follow up with out patient sx to schedule cholecystectomy. *** Pt was seen again in ED on 07/04/21 for constipation for 4 days and was prescribed Linzess daily ***   PERTINENT  PMH / PSH: ***  OBJECTIVE:   LMP 06/19/2021  ***  General: NAD, pleasant, able to participate in exam Cardiac: RRR, no murmurs. Respiratory: CTAB, normal effort, No wheezes, rales or rhonchi Abdomen: Bowel sounds present, nontender, nondistended, no hepatosplenomegaly. Extremities: no edema or cyanosis. Skin: warm and dry, no rashes noted Neuro: alert, no obvious focal deficits Psych: Normal affect and mood  ASSESSMENT/PLAN:   No problem-specific Assessment & Plan notes found for this encounter.     Dr. Erick Alley, DO [facility]    {    This will disappear when note is signed, click to select method of visit    :1}

## 2021-07-14 ENCOUNTER — Other Ambulatory Visit: Payer: Self-pay

## 2021-07-14 ENCOUNTER — Other Ambulatory Visit (HOSPITAL_COMMUNITY)
Admission: RE | Admit: 2021-07-14 | Discharge: 2021-07-14 | Disposition: A | Discharge status: Home / Self Care | Payer: Medicaid Other | Source: Ambulatory Visit | Attending: Family Medicine | Admitting: Family Medicine

## 2021-07-14 ENCOUNTER — Encounter: Payer: Self-pay | Admitting: Family Medicine

## 2021-07-14 ENCOUNTER — Ambulatory Visit (INDEPENDENT_AMBULATORY_CARE_PROVIDER_SITE_OTHER): Payer: Self-pay | Admitting: Family Medicine

## 2021-07-14 VITALS — BP 152/95 | HR 89 | Ht 60.63 in | Wt 235.5 lb

## 2021-07-14 DIAGNOSIS — N898 Other specified noninflammatory disorders of vagina: Secondary | ICD-10-CM

## 2021-07-14 DIAGNOSIS — N941 Unspecified dyspareunia: Secondary | ICD-10-CM | POA: Diagnosis present

## 2021-07-14 DIAGNOSIS — R300 Dysuria: Secondary | ICD-10-CM

## 2021-07-14 LAB — POCT URINALYSIS DIP (DEVICE)
Bilirubin Urine: NEGATIVE
Glucose, UA: NEGATIVE mg/dL
Hgb urine dipstick: NEGATIVE
Ketones, ur: NEGATIVE mg/dL
Leukocytes,Ua: NEGATIVE
Nitrite: NEGATIVE
Protein, ur: NEGATIVE mg/dL
Specific Gravity, Urine: >=1.03 (ref 1.005–1.030)
Urobilinogen, UA: 0.2 mg/dL (ref 0.0–1.0)
pH: 6 (ref 5.0–8.0)

## 2021-07-14 MED ORDER — METRONIDAZOLE 0.75 % VA GEL
1.0000 | Freq: Every day | VAGINAL | 0 refills | Status: AC
  Filled 2021-07-14: qty 70, 7d supply, fill #0
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
## 2021-07-15 LAB — CERVICOVAGINAL ANCILLARY ONLY
Bacterial Vaginitis (gardnerella): POSITIVE — AB
Candida Glabrata: NEGATIVE
Candida Vaginitis: NEGATIVE
Chlamydia: NEGATIVE
Comment: NEGATIVE
Comment: NEGATIVE
Comment: NEGATIVE
Comment: NEGATIVE
Comment: NEGATIVE
Comment: NORMAL
Neisseria Gonorrhea: NEGATIVE
Trichomonas: NEGATIVE

## 2021-07-16 ENCOUNTER — Other Ambulatory Visit: Payer: Self-pay

## 2021-07-16 ENCOUNTER — Telehealth: Payer: Self-pay

## 2021-07-16 MED ORDER — METRONIDAZOLE 500 MG PO TABS
500.0000 mg | ORAL_TABLET | Freq: Two times a day (BID) | ORAL | 0 refills | Status: AC
  Filled 2021-07-16: qty 14, 7d supply, fill #0

## 2021-07-16 NOTE — Telephone Encounter (Signed)
Call placed to pt with interpreter Raquel. Spoke with pt. Pt given results and recommendations per Dr Crissie Reese. Pt verbalized understanding and agreeable to plan of care.  Judeth Cornfield, RN

## 2021-07-16 NOTE — Telephone Encounter (Signed)
-----   Message from Venora Maples, MD sent at 07/16/2021  8:50 AM EST ----- Will send rx to pharmacy on file Non english speaking, please call to notify and let her know I have sent flagyl to pharmacy

## 2021-07-16 NOTE — Addendum Note (Signed)
Addended by: Merian Capron on: 07/16/2021 08:51 AM   Modules accepted: Orders

## 2021-07-18 LAB — URINE CULTURE

## 2021-07-23 ENCOUNTER — Other Ambulatory Visit: Payer: Self-pay

## 2021-09-01 ENCOUNTER — Ambulatory Visit: Payer: Medicaid Other | Admitting: Student

## 2021-09-01 ENCOUNTER — Ambulatory Visit: Payer: Medicaid Other

## 2021-09-01 NOTE — Patient Instructions (Incomplete)
It was wonderful to see you today. ? ?Please bring ALL of your medications with you to every visit.  ? ?Today we talked about: ? ?** ? ? ?Thank you for choosing [facility].  ? ?Please call [phone] with any questions about today's appointment. ? ?Please be sure to schedule follow up at the front  desk before you leave today.  ? ?Britteny Fiebelkorn, DO ?PGY-2 Family Medicine   ?
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
## 2021-09-06 DIAGNOSIS — N939 Abnormal uterine and vaginal bleeding, unspecified: Secondary | ICD-10-CM

## 2021-09-06 HISTORY — DX: Abnormal uterine and vaginal bleeding, unspecified: N93.9

## 2021-09-10 ENCOUNTER — Ambulatory Visit (INDEPENDENT_AMBULATORY_CARE_PROVIDER_SITE_OTHER): Payer: Self-pay | Admitting: Student

## 2021-09-10 ENCOUNTER — Encounter: Payer: Self-pay | Admitting: Student

## 2021-09-10 ENCOUNTER — Other Ambulatory Visit: Payer: Self-pay

## 2021-09-10 VITALS — BP 146/67 | HR 92 | Wt 244.0 lb

## 2021-09-10 DIAGNOSIS — K8020 Calculus of gallbladder without cholecystitis without obstruction: Secondary | ICD-10-CM

## 2021-09-10 HISTORY — DX: Calculus of gallbladder without cholecystitis without obstruction: K80.20

## 2021-09-10 MED ORDER — PROMETHAZINE HCL 12.5 MG PO TABS
12.5000 mg | ORAL_TABLET | Freq: Three times a day (TID) | ORAL | 0 refills | Status: DC | PRN
  Filled 2021-09-10: qty 20, 7d supply, fill #0

## 2021-09-10 MED ORDER — DICYCLOMINE HCL 10 MG PO CAPS
10.0000 mg | ORAL_CAPSULE | Freq: Three times a day (TID) | ORAL | 0 refills | Status: DC
  Filled 2021-09-10: qty 28, 7d supply, fill #0

## 2021-09-10 NOTE — Progress Notes (Signed)
° ° °  SUBJECTIVE:   CHIEF COMPLAINT / HPI:   Per chart review, pt was seen on 06/25/21 for epigastric and RUQ pain with vomiting and headaches. US showed cholelithiasis and she was referred to out patient surgery for cholecystectomy for possible symptomatic cholelithiasis.  After that visit, Pt states she was out of work for about 3 weeks due to vomiting, it stopped for a short time and came back. She has not eaten today d/t stomach pain that feels like "electrical shocks" and nausea. Pain is located in RUQ and epigastric area. She has also had some mild abdominal cramping that feels like she needs to have a BM but when she tries, does not have one. She  has vomited twice today and saw a little blood the first time but not the second. She is unsure how much blood and color of blood as she was a little dizzy and didn't pay much attention to it.   Daughter states medicaid is "family planning" and insurance will not cover the cholecystectomy. Daughter states she needs a letter stating mother needs the surgery.   PERTINENT  PMH / PSH: Obesity, GERD, IBS, nausea and vomiting  OBJECTIVE:   Vitals:   09/10/21 1622  BP: (!) 146/67  Pulse: 92  SpO2: 97%     General: NAD, pleasant, able to participate in exam Cardiac: RRR, no murmurs. Respiratory: CTAB, normal effort, No wheezes, rales or rhonchi Abdomen: Bowel sounds present, soft, nondistended, tender to palpation of RUG and epigastric area. Negative murphy sign  Neuro: alert, no obvious focal deficits Psych: Normal affect and mood  ASSESSMENT/PLAN:   Abdominal pain   Nausea and vomiting It is possible pts ongoing nausea and vomiting is related to cholelithiasis although there is no evidence of cholecystitis on previous US and murphy sign is negative. Pt has had CT abdomen in October to explore possible explanations for her pain and vomiting but no acute abnormality was seen. This nausea and vomiting has been persistent for several months with  no clear explanation. I believe it is reasonable to pt to have cholecystectomy as suggested by ED provider on 06/25/21 and see if this leads to resolution of her symptoms as they are greatly impacting her life and ability to work. Pt would like to proceed with surgery.  -work excuse given for 2 days -referral to general surgery for cholecystectomy -Letter written as requested by pt for insurance purposes stating that pt may benefit from cholecystectomy      Dr. Erick Alley, DO [facility]

## 2021-09-10 NOTE — Patient Instructions (Addendum)
¡  Estuvo muy bien verte! Gracias por permitirme participar en su cuidado!  Nuestros planes para hoy: -He enviado recetas para un medicamento contra las nuseas llamado Phenergan y para un medicamento llamado Bentyl para Counsellor. -He enviado una nueva referencia para ciruga general, te llamarn para una cita.  Cudese y busque atencin inmediata antes si tiene alguna inquietud.  Dr. Erick Alley, DO [facility]

## 2021-09-11 ENCOUNTER — Other Ambulatory Visit: Payer: Self-pay

## 2021-09-14 ENCOUNTER — Other Ambulatory Visit: Payer: Self-pay

## 2021-10-05 ENCOUNTER — Ambulatory Visit (INDEPENDENT_AMBULATORY_CARE_PROVIDER_SITE_OTHER): Payer: Self-pay | Admitting: Family Medicine

## 2021-10-05 ENCOUNTER — Other Ambulatory Visit: Payer: Self-pay

## 2021-10-05 VITALS — BP 117/84 | HR 110 | Temp 99.4°F | Ht 63.0 in | Wt 241.0 lb

## 2021-10-05 DIAGNOSIS — R059 Cough, unspecified: Secondary | ICD-10-CM | POA: Diagnosis not present

## 2021-10-05 DIAGNOSIS — B349 Viral infection, unspecified: Secondary | ICD-10-CM

## 2021-10-05 DIAGNOSIS — R1010 Upper abdominal pain, unspecified: Secondary | ICD-10-CM

## 2021-10-05 DIAGNOSIS — R1013 Epigastric pain: Secondary | ICD-10-CM

## 2021-10-05 DIAGNOSIS — G8929 Other chronic pain: Secondary | ICD-10-CM

## 2021-10-05 LAB — POCT INFLUENZA A/B
Influenza A, POC: NEGATIVE
Influenza B, POC: NEGATIVE

## 2021-10-05 MED ORDER — ACETAMINOPHEN 500 MG PO TABS
500.0000 mg | ORAL_TABLET | Freq: Four times a day (QID) | ORAL | 0 refills | Status: DC | PRN
  Filled 2021-10-05: qty 30, 8d supply, fill #0

## 2021-10-05 NOTE — Progress Notes (Signed)
SUBJECTIVE:   CHIEF COMPLAINT / HPI:  Chief Complaint  Patient presents with   Cough   Sore Throat    Here with daughter who is translating.  Previously seen for epigastric/RUQ pain found to have cholelithiasis.  She was previously referred to general surgery for possible cholecystectomy.  She has not yet had an appointment with the general surgeon as she is still working on obtaining insurance.  She started feeling ill yesterday with symptoms of abdominal pain, nausea, vomiting, cough, sore throat, subjective fever, chills, myalgias, headache.  She had 3 episodes of vomiting yesterday and one episode during this encounter.  Abdominal pain is not worse with eating.  Pain is similar to pain she has had in the past.  Denies constipation, diarrhea, urinary symptoms.  No sick contacts at home.  She states she has been eating and drinking fine.  PERTINENT  PMH / PSH: IBS, AUB, GERD  Patient Care Team: Erick Alley, DO as PCP - General (Family Medicine)   OBJECTIVE:   BP 117/84    Pulse (!) 110    Temp 99.4 F (37.4 C) (Oral)    Ht 5\' 3"  (1.6 m)    Wt 241 lb (109.3 kg)    SpO2 99%    BMI 42.69 kg/m   Physical Exam Constitutional:      General: She is not in acute distress. HENT:     Head: Normocephalic and atraumatic.     Mouth/Throat:     Mouth: Mucous membranes are moist.     Pharynx: Oropharynx is clear. No oropharyngeal exudate or posterior oropharyngeal erythema.  Eyes:     General: No scleral icterus. Cardiovascular:     Rate and Rhythm: Normal rate and regular rhythm.     Heart sounds: Normal heart sounds.  Pulmonary:     Effort: Pulmonary effort is normal. No respiratory distress.     Breath sounds: Normal breath sounds.  Abdominal:     Palpations: Abdomen is soft.     Comments: Mild diffuse tenderness primarily epigastric and RUQ as well as suprapubic.  Musculoskeletal:     Cervical back: Neck supple.  Skin:    General: Skin is warm and dry.  Neurological:      Mental Status: She is alert.     Depression screen [facility] 2/9 10/05/2021  Decreased Interest 0  Down, Depressed, Hopeless 0  PHQ - 2 Score 0  Altered sleeping 0  Tired, decreased energy 0  Change in appetite 0  Feeling bad or failure about yourself  0  Trouble concentrating 0  Moving slowly or fidgety/restless 0  Suicidal thoughts 0  PHQ-9 Score 0  Difficult doing work/chores -  Some recent data might be hidden     {Show previous vital signs (optional):23777}    ASSESSMENT/PLAN:   Viral illness Constellation of symptoms consistent with viral illness.  Flu negative.  Low suspicion for bacterial infection. - Covid pending - Tylenol prn  - honey for cough  Abdominal pain, chronic, epigastric She has some mild tenderness on exam but overall exam is benign.  Pain possibly from viral illness or possibly separate from current illness as abdominal pain has been a recurrent issue for her.  We will check labs to rule out other causes such as acute cholecystitis, pancreatitis.  Plan is for possible cholecystectomy once insurance issues are figured out. - CBC, CMP, lipase   Note written out of work until 2/2.  Return if symptoms worsen or fail to improve.  Littie Deeds, MD [facility]

## 2021-10-05 NOTE — Assessment & Plan Note (Addendum)
She has some mild tenderness on exam but overall exam is benign.  Pain possibly from viral illness or possibly separate from current illness as abdominal pain has been a recurrent issue for her.  We will check labs to rule out other causes such as acute cholecystitis, pancreatitis.  Plan is for possible cholecystectomy once insurance issues are figured out. - CBC, CMP, lipase

## 2021-10-05 NOTE — Patient Instructions (Addendum)
It was nice seeing you today!  Take Tylenol 500 mg every 6 hours as needed.  Try honey for cough.  Stay well, Deborah Button, MD [place] [phone]  --  Make sure to check out at the front desk before you leave today.  Please arrive at least 15 minutes prior to your scheduled appointments.  If you had blood work today, I will send you a MyChart message or a letter if results are normal. Otherwise, I will give you a call.  If you had a referral placed, they will call you to set up an appointment. Please give [place] a call if you don't hear back in the next 2 weeks.  If you need additional refills before your next appointment, please call your pharmacy first.

## 2021-10-06 LAB — COMPREHENSIVE METABOLIC PANEL
ALT: 12 IU/L (ref 0–32)
AST: 10 IU/L (ref 0–40)
Albumin/Globulin Ratio: 1.3 (ref 1.2–2.2)
Albumin: 3.9 g/dL (ref 3.8–4.8)
Alkaline Phosphatase: 67 IU/L (ref 44–121)
BUN/Creatinine Ratio: 15 (ref 9–23)
BUN: 10 mg/dL (ref 6–20)
Bilirubin Total: 0.8 mg/dL (ref 0.0–1.2)
CO2: 23 mmol/L (ref 20–29)
Calcium: 8.6 mg/dL — ABNORMAL LOW (ref 8.7–10.2)
Chloride: 100 mmol/L (ref 96–106)
Creatinine, Ser: 0.68 mg/dL (ref 0.57–1.00)
Globulin, Total: 3 g/dL (ref 1.5–4.5)
Glucose: 84 mg/dL (ref 70–99)
Potassium: 4.6 mmol/L (ref 3.5–5.2)
Sodium: 136 mmol/L (ref 134–144)
Total Protein: 6.9 g/dL (ref 6.0–8.5)
eGFR: 114 mL/min/{1.73_m2} (ref >59)

## 2021-10-06 LAB — CBC
Hematocrit: 39.3 % (ref 34.0–46.6)
Hemoglobin: 12.6 g/dL (ref 11.1–15.9)
MCH: 24.6 pg — ABNORMAL LOW (ref 26.6–33.0)
MCHC: 32.1 g/dL (ref 31.5–35.7)
MCV: 77 fL — ABNORMAL LOW (ref 79–97)
Platelets: 243 10*3/uL (ref 150–450)
RBC: 5.12 x10E6/uL (ref 3.77–5.28)
RDW: 13.1 % (ref 11.7–15.4)
WBC: 5.4 10*3/uL (ref 3.4–10.8)

## 2021-10-06 LAB — LIPASE: Lipase: 27 U/L (ref 14–72)

## 2021-10-06 LAB — NOVEL CORONAVIRUS, NAA: SARS-CoV-2, NAA: NOT DETECTED

## 2021-10-06 LAB — SARS-COV-2, NAA 2 DAY TAT

## 2021-10-08 ENCOUNTER — Ambulatory Visit: Payer: Medicaid Other

## 2021-10-08 ENCOUNTER — Telehealth: Payer: Self-pay

## 2021-10-08 NOTE — Telephone Encounter (Signed)
Patients daughter calls nurse line requesting an extension on work note. . Daughter reports symptoms have not improved. When asked what symptoms she is still experiencing daughter stated, "the same ones." Daughter reports she was supposed to go back today, however did not, and is requesting an extra day tomorrow to rest.  Will forward to provider who patient.

## 2021-10-08 NOTE — Telephone Encounter (Signed)
OK to extend work note. Can you draft a note in my behalf?

## 2021-10-09 NOTE — Telephone Encounter (Signed)
New letter created. Daughter informed and will access via mychart.

## 2021-10-15 ENCOUNTER — Ambulatory Visit (INDEPENDENT_AMBULATORY_CARE_PROVIDER_SITE_OTHER): Payer: Medicaid Other | Admitting: Family Medicine

## 2021-10-15 ENCOUNTER — Other Ambulatory Visit: Payer: Self-pay

## 2021-10-15 VITALS — BP 105/82 | HR 99 | Temp 98.2°F | Ht 63.0 in | Wt 239.4 lb

## 2021-10-15 DIAGNOSIS — R112 Nausea with vomiting, unspecified: Secondary | ICD-10-CM | POA: Diagnosis not present

## 2021-10-15 DIAGNOSIS — G8929 Other chronic pain: Secondary | ICD-10-CM | POA: Diagnosis not present

## 2021-10-15 DIAGNOSIS — R1013 Epigastric pain: Secondary | ICD-10-CM | POA: Diagnosis not present

## 2021-10-15 DIAGNOSIS — J019 Acute sinusitis, unspecified: Secondary | ICD-10-CM | POA: Diagnosis not present

## 2021-10-15 MED ORDER — CETIRIZINE HCL 10 MG PO TABS
10.0000 mg | ORAL_TABLET | Freq: Every day | ORAL | 11 refills | Status: DC
  Filled 2021-10-15 – 2021-10-16 (×2): qty 30, 30d supply, fill #0

## 2021-10-15 MED ORDER — AMOXICILLIN-POT CLAVULANATE 875-125 MG PO TABS
1.0000 | ORAL_TABLET | Freq: Two times a day (BID) | ORAL | 0 refills | Status: AC
  Filled 2021-10-15: qty 10, 5d supply, fill #0

## 2021-10-15 MED ORDER — OMEPRAZOLE 40 MG PO CPDR
40.0000 mg | DELAYED_RELEASE_CAPSULE | Freq: Every day | ORAL | 0 refills | Status: DC
  Filled 2021-10-15: qty 30, 30d supply, fill #0

## 2021-10-15 NOTE — Patient Instructions (Signed)
It can take several weeks for your cough to go away, typically if you are having a cough for longer than 2 months may become a little bit more concerning.  Your lungs sound good when I am listening to them today and I am not concerned about a pneumonia.  Given you have had the symptoms for several weeks, this could be a possible sinus infection picture and we will treat with a short course of antibiotics.  If this does not improve the symptoms and it likely is not a bacterial infection and we will have to just do symptom control with things like Zyrtec/allergy medications (these can help dry up some of the congestion).

## 2021-10-15 NOTE — Progress Notes (Signed)
° ° °  SUBJECTIVE:   CHIEF COMPLAINT / HPI:   Patient presents with her daughter, who acts as her interpreter.  Family was seen a few weeks ago due to respiratory symptoms and was negative for flu/COVID at that time.  Patient presents again for 2 weeks of cough with congestion, headaches, stomach pains, intermittent body aches (that have not improved).  She reports that her cough is worse at night and in the morning and that she has some phlegm that comes up with it.  She has not been having any fevers or vomiting.  She was taking Robitussin, honey, Tylenol but has not taken anything since Tuesday 2/7. She and daughter are concerned as I do not think that she should be having the symptoms for this long.  PERTINENT  PMH / PSH: Reviewed  OBJECTIVE:   BP 105/82    Pulse 99    Temp 98.2 F (36.8 C) (Oral)    Ht 5\' 3"  (1.6 m)    Wt 239 lb 6 oz (108.6 kg)    LMP 09/24/2021    SpO2 98%    BMI 42.40 kg/m   Gen: well-appearing, NAD CV: RRR, no m/r/g appreciated, no peripheral edema Pulm: CTAB, no wheezes/crackles, transmitted upper airway sounds secondary to congestion GI: soft, non-tender, non-distended HEENT: Pharynx nonerythematous, no oral lesions noted, TMs clear bilaterally  ASSESSMENT/PLAN:   Continued cough and congestion, consider sinusitis Patient with continued signs for 2 weeks with persistent congestion and cough.  Did consider this is likely secondary to resolving URI.  Counseled family on expectations for cough to take several weeks for resolution and anticipate improvement.  Did consider antibiotic initiation to treat for possible sinusitis given duration of congestive symptoms, after extensive discussion (discussed this with Dr. Andria Frames) family will proceed with antihistamine treatment and symptom control.  Did also consider that uncontrolled reflux may be contributing to patient's symptoms. - Zyrtec 10 mg nightly - Refill omeprazole - Prescribed Augmentin twice daily x5 days if  patient's symptoms persist and there is concern for sinusitis  Expired Nexplanon Patient with Nexplanon placed in 2019, recommend removal.   Rise Patience, [place]

## 2021-10-16 ENCOUNTER — Other Ambulatory Visit: Payer: Self-pay

## 2021-10-19 ENCOUNTER — Ambulatory Visit: Payer: Medicaid Other

## 2021-10-22 ENCOUNTER — Ambulatory Visit: Payer: Self-pay | Admitting: General Surgery

## 2021-10-22 DIAGNOSIS — K8020 Calculus of gallbladder without cholecystitis without obstruction: Secondary | ICD-10-CM | POA: Diagnosis not present

## 2021-10-22 NOTE — H&P (Signed)
Chief Complaint: Abdominal Pain ?  ?  ?  ?History of Present Illness: ?Deborah Chandler is a 40 y.o. female who is seen today as an office consultation at the request of Dr. Yao for evaluation of Abdominal Pain ?.   ?  ?Patient is a 40-year-old female, Spanish-speaking, who comes in secondary to right upper quadrant/epigastric pain.  Patient was previously in the ER secondary to pain.  She underwent ultrasound and CT scan.  CT scan was significant for gallstones however no signs of cholecystitis.  Patient had normal LFTs. ?  ?Patient states that the pain usually is with eating.  She states that the pain last several hours thereafter.  States this is associated with nausea, vomiting and dizziness. ?  ?  ?  ?  ?Review of Systems: ?A complete review of systems was obtained from the patient.  I have reviewed this information and discussed as appropriate with the patient.  See HPI as well for other ROS. ?  ?Review of Systems  ?Constitutional: Negative for fever.  ?HENT: Negative for congestion.   ?Eyes: Negative for blurred vision.  ?Respiratory: Negative for cough, shortness of breath and wheezing.   ?Cardiovascular: Negative for chest pain and palpitations.  ?Gastrointestinal: Positive for abdominal pain, heartburn, nausea and vomiting.  ?Genitourinary: Negative for dysuria.  ?Musculoskeletal: Negative for myalgias.  ?Skin: Negative for rash.  ?Neurological: Negative for dizziness and headaches.  ?Psychiatric/Behavioral: Negative for depression and suicidal ideas.  ?All other systems reviewed and are negative. ?  ?  ?  ?Medical History: ?Past Medical History ?History reviewed. No pertinent past medical history. ?  ?  ?There is no problem list on file for this patient. ?  ?  ?Past Surgical History ?History reviewed. No pertinent surgical history. ?  ?  ?Allergies ?Allergies ?Allergen           Reactions ?           Metronidazole Nausea ?                          Dizziness ?  ?  ?  ?No current outpatient medications  on file prior to visit. ?  ?No current facility-administered medications on file prior to visit. ?  ?  ?Family History ?History reviewed. No pertinent family history. ?  ?  ?Social History ?  ?Tobacco Use ?Smoking Status           Never ?Smokeless Tobacco   Never ?  ?  ?Social History ?Social History ?  ?  ?Socioeconomic History ?           Marital status:  Married ?Tobacco Use ?           Smoking status:          Never ?           Smokeless tobacco:    Never ?Vaping Use ?           Vaping Use:    Never used ?Substance and Sexual Activity ?           Alcohol use:    Yes ?           Drug use:        Never ?  ?  ?  ?Objective: ?  ?  ?Vitals: ?             10/22/21 1121 ?BP:      (!) 142/90 ?Pulse:  76 ?Temp:    36.7 ?C (98 ?F) ?SpO2:  98% ?Weight:            (!) 109.2 kg (240 lb 12.8 oz) ?Height: 160 cm (5' 3") ?  ?Body mass index is 42.66 kg/m?. ?  ?Physical Exam ?Constitutional:   ?   Appearance: Normal appearance.  ?HENT:  ?   Head: Normocephalic and atraumatic.  ?   Mouth/Throat:  ?   Mouth: Mucous membranes are moist.  ?   Pharynx: Oropharynx is clear.  ?Eyes:  ?   General: No scleral icterus. ?   Pupils: Pupils are equal, round, and reactive to light.  ?Cardiovascular:  ?   Rate and Rhythm: Normal rate and regular rhythm.  ?   Pulses: Normal pulses.  ?   Heart sounds: No murmur heard. ?  No friction rub. No gallop.  ?Pulmonary:  ?   Effort: Pulmonary effort is normal. No respiratory distress.  ?   Breath sounds: Normal breath sounds. No stridor.  ?Abdominal:  ?   General: Abdomen is flat.  ?Musculoskeletal:     ?   General: No swelling.  ?Skin: ?   General: Skin is warm.  ?Neurological:  ?   General: No focal deficit present.  ?   Mental Status: She is alert and oriented to person, place, and time. Mental status is at baseline.  ?Psychiatric:     ?   Mood and Affect: Mood normal.     ?   Thought Content: Thought content normal.     ?   Judgment: Judgment normal.  ?  ?  ?  ?Assessment and Plan: ?Diagnoses and all  orders for this visit: ?  ?Symptomatic cholelithiasis ?  ?  ?Deborah Chandler is a 40 y.o. female  ?  ?1.          We will proceed to the OR for a lap cholecystectomy. ?2.         All risks and benefits were discussed with the patient to generally include: infection, bleeding, possible need for post op ERCP, damage to the bile ducts, and bile leak. Alternatives were offered and described.  All questions were answered and the patient voiced understanding of the procedure and wishes to proceed at this point with a laparoscopic cholecystectomy ?  ?  ?  ?  ?  ?No follow-ups on file. ?  ?Alliana Mcauliff, MD, FACS ?[facility] ?General & Minimally Invasive Surgery ? ?

## 2021-10-30 ENCOUNTER — Encounter: Payer: Self-pay | Admitting: Family Medicine

## 2021-10-30 NOTE — Progress Notes (Signed)
Patient has no-showed to multiple appointments in a 6-month period. Per our no-show policy, a letter has been routed to [facility] Admin to be mailed to patient regarding likely dismissal for repeat no show. Will CC to PCP.  ° °

## 2021-12-09 ENCOUNTER — Ambulatory Visit (INDEPENDENT_AMBULATORY_CARE_PROVIDER_SITE_OTHER): Payer: Medicaid Other | Admitting: Student

## 2021-12-09 ENCOUNTER — Other Ambulatory Visit: Payer: Self-pay

## 2021-12-09 VITALS — BP 130/90 | HR 80 | Temp 98.2°F | Ht 63.0 in | Wt 235.2 lb

## 2021-12-09 DIAGNOSIS — R059 Cough, unspecified: Secondary | ICD-10-CM

## 2021-12-09 DIAGNOSIS — J069 Acute upper respiratory infection, unspecified: Secondary | ICD-10-CM | POA: Insufficient documentation

## 2021-12-09 MED ORDER — BENZONATATE 100 MG PO CAPS
100.0000 mg | ORAL_CAPSULE | Freq: Three times a day (TID) | ORAL | 0 refills | Status: DC | PRN
  Filled 2021-12-09: qty 20, 7d supply, fill #0

## 2021-12-09 MED ORDER — FLUTICASONE PROPIONATE 50 MCG/ACT NA SUSP
1.0000 | Freq: Every day | NASAL | 12 refills | Status: DC
  Filled 2021-12-09: qty 16, 25d supply, fill #0

## 2021-12-09 NOTE — Assessment & Plan Note (Signed)
Symptoms most consistent with viral URI. Pt requests medication to help with cough. Will also prescribe Flonase  to help with postnasal drip ?-Supportive care ?-tessalon pearls 100 mg TID prn ?-Flonase 1 spray into each nostril daily ?

## 2021-12-09 NOTE — Progress Notes (Signed)
? ? ?  SUBJECTIVE:  ? ?CHIEF COMPLAINT / HPI: Cold like symptoms ? ?Cold like symptoms ?Pt complains of worsening cough for past couple weeks. Sometimes coughs up phlegm, can be pink, white or green. Has vomited three times in past few days d/t coughing. Admits to some nausea. Felt warm a few days ago but unsure if she has had a fever. Admits to headache, chest pain, body aches, congestion and chills. Denies SOB. Has taken ibuprofen every 8 hours for past 4 days. Cannot sleep d/t coughing. Admits to burning of eyes for past 2 weeks, denies any drainage.  ? ?PERTINENT  PMH / PSH: GERD ? ?OBJECTIVE:  ? ?Vitals:  ? 12/09/21 1337  ?BP: 130/90  ?Pulse: 80  ?Temp: 98.2 ?F (36.8 ?C)  ?SpO2: 98%  ? ? ? ?General: NAD, pleasant, able to participate in exam ?HEENT: Very slight erythema of bilateral conjunctiva, unable to view bilateral TMs due to cerumen, MMM, erythematous nasal turbinates, no erythema or exudate of nasopharynx ?Cardiac: RRR, no murmurs. ?Respiratory: CTAB, normal effort, No wheezes, rales or rhonchi ?Skin: warm and dry ?Neuro: alert, no obvious focal deficits ?Psych: Normal affect and mood ? ?ASSESSMENT/PLAN:  ? ?  ?Viral URI ?Symptoms most consistent with viral URI. Pt requests medication to help with cough. Will also prescribe Flonase to help with postnasal drip and hopefully cough. ?-COVID and flu testing ?-Supportive care ?-tessalon pearls 100 mg TID prn ?-Flonase 1 spray into each nostril daily ? ?Dr. Erick Alley, DO ?[facility]  ? ? ? ? ?

## 2021-12-09 NOTE — Patient Instructions (Signed)
It was great to see you! Thank you for allowing me to participate in your care! ? ?I recommend that you always bring your medications to each appointment as this makes it easy to ensure you are on the correct medications and helps [place] not miss when refills are needed. ? ?Our plans for today:  ?- I prescribed you a medication called Tessalon Pearls. You can take this up to three times a day as needed for your cough. ?- If the tessalon pearls do not help, you can try over the counter mucinex or Flonase nasal spray ? ?Nuestros planes para hoy:  ?- Te recet? un medicamento llamado Perlas de [place]. Puede tomar esto hasta tres veces al d?a seg?n sea necesario para su tos. ?- Si las perlas de tessalon no [place], puede probar el aerosol nasal mucinex o Flonase de venta [place] ? ?We are checking some labs today, I will call you if they are abnormal will send you a MyChart message or a letter if they are normal.  If you do not hear about your labs in the next 2 weeks please let us know. ? ?Take care and seek immediate care sooner if you develop any concerns.  ? ?Dr. Precious Gilding, DO ?[facility] ? ?

## 2021-12-10 ENCOUNTER — Ambulatory Visit (INDEPENDENT_AMBULATORY_CARE_PROVIDER_SITE_OTHER): Payer: Medicaid Other | Admitting: Student

## 2021-12-10 ENCOUNTER — Encounter: Payer: Self-pay | Admitting: Student

## 2021-12-10 ENCOUNTER — Other Ambulatory Visit: Payer: Self-pay

## 2021-12-10 VITALS — BP 128/91 | HR 78 | Ht 61.0 in | Wt 237.5 lb

## 2021-12-10 DIAGNOSIS — N939 Abnormal uterine and vaginal bleeding, unspecified: Secondary | ICD-10-CM | POA: Diagnosis not present

## 2021-12-10 MED ORDER — IBUPROFEN 800 MG PO TABS
ORAL_TABLET | ORAL | 1 refills | Status: DC
  Filled 2021-12-10: qty 60, 20d supply, fill #0

## 2021-12-10 NOTE — Progress Notes (Signed)
Pt states during her cycle she is having heavy bleeding & clotting, she currently has a Nexplanon which was inserted in 02/06/2018. ?

## 2021-12-10 NOTE — Progress Notes (Signed)
?  History:  ?[name] is a 41 y.o. G2P2002 who presents to clinic today to discuss her periods. She reports that her bleeding is sometimes 15 days long; she does not remember when this happened. She reports that sometimes it is 7-8 days. She always has clots. She has heavy cramps. She has had this problem for 9 years. She has tried Insurance risk surveyor; she has tried Designer, industrial/product. She does not want IUD. She has a Nexplanon in place which expires in 2023; she is planning to go to [facility] medicine center for removal.  ? ?She is using BTL for birth control.  ? ?The following portions of the patient's history were reviewed and updated as appropriate: allergies, current medications, family history, past medical history, social history, past surgical history and problem list. ? ?Review of Systems:  ?Review of Systems  ?Constitutional: Negative.   ?HENT: Negative.    ?Respiratory: Negative.    ?Cardiovascular: Negative.   ?Genitourinary: Negative.   ?Musculoskeletal: Negative.   ?Skin: Negative.   ? ?  ?Objective:  ?Physical Exam ?BP (!) 128/91   Pulse 78   Ht 5\' 1"  (1.549 m)   Wt 237 lb 8 oz (107.7 kg)   LMP 12/05/2021   BMI 44.88 kg/m?  ?Physical Exam ?Constitutional:   ?   Appearance: Normal appearance.  ?HENT:  ?   Head: Normocephalic.  ?Cardiovascular:  ?   Rate and Rhythm: Normal rate.  ?Pulmonary:  ?   Effort: Pulmonary effort is normal.  ?Abdominal:  ?   General: Abdomen is flat.  ?Skin: ?   General: Skin is warm.  ?Neurological:  ?   Mental Status: She is alert.  ? ? ? ? ?Labs and Imaging ?No results found for this or any previous visit (from the past 24 hour(s)). ? ?No results found. ? ?Health Maintenance Due  ?Topic Date Due  ? COVID-19 Vaccine (1) Never done  ? Hepatitis C Screening  Never done  ? ? ? ?Assessment & Plan:  ? ?1. Abnormal uterine bleeding   ?-patient recommended to try NSAIDS as she does not want to do IUD, OCPs or surgery. She would like to go to the [facility] in order to have Nexplanon  removed. Long discussion with patient about better options that NSAIDS but she does not want IUD or surgery at this time.  ?-patient with slightly elevated blood pressure today; she will follow up at [facility] ?-she has appt scheduled for cholesytecotmy; she is planning to keep that appt.  ?-reviewed that she had normal pap in 2021; not due until August 2024 ?Approximately 25 minutes of total time was spent with this patient on counseling and care.  ? ?September 2024, CNM ?12/10/2021 ?3:50 PM ? ?

## 2021-12-11 LAB — COVID-19, FLU A+B NAA
Influenza A, NAA: NOT DETECTED
Influenza B, NAA: NOT DETECTED
SARS-CoV-2, NAA: NOT DETECTED

## 2021-12-11 NOTE — Pre-Procedure Instructions (Signed)
Surgical Instructions ? ? ? Your procedure is scheduled on Monday 12/21/21. ? ? Report to [facility] Main Entrance "A" at 10:15 A.M., then check in with the Admitting office. ? Call this number if you have problems the morning of surgery: ? [phone] ? ? If you have any questions prior to your surgery date call [phone]: Open Monday-Friday 8am-4pm ? ? ? Remember: ? Do not eat after midnight the night before your surgery ? ?You may drink clear liquids until 09:15 A.M. the morning of your surgery.   ?Clear liquids allowed are: Water, Non-Citrus Juices (without pulp), Carbonated Beverages, Clear Tea, Black Coffee ONLY (NO MILK, CREAM OR POWDERED CREAMER of any kind), and Gatorade ? ?Patient Instructions ? ?The night before surgery:  ?No food after midnight. ONLY clear liquids after midnight ? ?The day of surgery (if you do NOT have diabetes):  ?Drink ONE (1) Pre-Surgery Clear Ensure by 09:15 A.M. the morning of surgery. Drink in one sitting. Do not sip.  ?This drink was given to you during your hospital  ?pre-op appointment visit. ? ?Nothing else to drink after completing the  ?Pre-Surgery Clear Ensure. ? ?       If you have questions, please contact your surgeon?s office. ? ?  ? Take these medicines the morning of surgery with A SIP OF WATER:  ? fluticasone (FLONASE) 50 MCG/ACT nasal spray ? ? Take these medicines if needed:  ? acetaminophen (TYLENOL) ? ? ?As of today, STOP taking any Aspirin (unless otherwise instructed by your surgeon) Aleve, Naproxen, Ibuprofen, Motrin, Advil, Goody's, BC's, all herbal medications, fish oil, and all vitamins. ? ?         ?Do not wear jewelry or makeup ?Do not wear lotions, powders, perfumes/colognes, or deodorant. ?Do not shave 48 hours prior to surgery.  Men may shave face and neck. ?Do not bring valuables to the hospital. ?Do not wear nail polish, gel polish, artificial nails, or any other type of covering on natural nails (fingers and toes) ?If you have artificial nails or  gel coating that need to be removed by a nail salon, please have this removed prior to surgery. Artificial nails or gel coating may interfere with anesthesia's ability to adequately monitor your vital signs. ? ?[facility] is not responsible for any belongings or valuables. .  ? ?Do NOT Smoke (Tobacco/Vaping)  24 hours prior to your procedure ? ?If you use a CPAP at night, you may bring your mask for your overnight stay. ?  ?Contacts, glasses, hearing aids, dentures or partials may not be worn into surgery, please bring cases for these belongings ?  ?For patients admitted to the hospital, discharge time will be determined by your treatment team. ?  ?Patients discharged the day of surgery will not be allowed to drive home, and someone needs to stay with them for 24 hours. ? ? ?SURGICAL WAITING ROOM VISITATION ?Patients having surgery or a procedure in a hospital may have two support people. ?Children under the age of 43 must have an adult with them who is not the patient. ?They may stay in the waiting area during the procedure and may switch out with other visitors. If the patient needs to stay at the hospital during part of their recovery, the visitor guidelines for inpatient rooms apply. ? ?Please refer to the [facility] website for the visitor guidelines for Inpatients (after your surgery is over and you are in a regular room).  ? ? ? ? ? ?Special instructions:   ? ?  Oral Hygiene is also important to reduce your risk of infection.  Remember - BRUSH YOUR TEETH THE MORNING OF SURGERY WITH YOUR REGULAR TOOTHPASTE ? ? ?[place]- Preparing For Surgery ? ?Before surgery, you can play an important role. Because skin is not sterile, your skin needs to be as free of germs as possible. You can reduce the number of germs on your skin by washing with CHG (chlorahexidine gluconate) Soap before surgery.  CHG is an antiseptic cleaner which kills germs and bonds with the skin to continue killing germs even after washing.    ? ? ?Please do not use if you have an allergy to CHG or antibacterial soaps. If your skin becomes reddened/irritated stop using the CHG.  ?Do not shave (including legs and underarms) for at least 48 hours prior to first CHG shower. It is OK to shave your face. ? ?Please follow these instructions carefully. ?  ? ? Shower the NIGHT BEFORE SURGERY and the MORNING OF SURGERY with CHG Soap.  ? If you chose to wash your hair, wash your hair first as usual with your normal shampoo. After you shampoo, rinse your hair and body thoroughly to remove the shampoo.  Then [employer] and genitals (private parts) with your normal soap and rinse thoroughly to remove soap. ? ?After that Use CHG Soap as you would any other liquid soap. You can apply CHG directly to the skin and wash gently with a scrungie or a clean washcloth.  ? ?Apply the CHG Soap to your body ONLY FROM THE NECK DOWN.  Do not use on open wounds or open sores. Avoid contact with your eyes, ears, mouth and genitals (private parts). Wash Face and genitals (private parts)  with your normal soap.  ? ?Wash thoroughly, paying special attention to the area where your surgery will be performed. ? ?Thoroughly rinse your body with warm water from the neck down. ? ?DO NOT shower/wash with your normal soap after using and rinsing off the CHG Soap. ? ?Pat yourself dry with a CLEAN TOWEL. ? ?Wear CLEAN PAJAMAS to bed the night before surgery ? ?Place CLEAN SHEETS on your bed the night before your surgery ? ?DO NOT SLEEP WITH PETS. ? ? ?Day of Surgery: ? ?Take a shower with CHG soap. ?Wear Clean/Comfortable clothing the morning of surgery ?Do not apply any deodorants/lotions.   ?Remember to brush your teeth WITH YOUR REGULAR TOOTHPASTE. ? ? ? ?If you received a COVID test during your pre-op visit  it is requested that you wear a mask when out in public, stay away from anyone that may not be feeling well and notify your surgeon if you develop symptoms. If you have been in contact  with anyone that has tested positive in the last 10 days please notify you surgeon. ? ?  ?Please read over the following fact sheets that you were given.  ? ?

## 2021-12-13 ENCOUNTER — Encounter: Payer: Self-pay | Admitting: Student

## 2021-12-14 ENCOUNTER — Encounter (HOSPITAL_COMMUNITY): Payer: Self-pay

## 2021-12-14 ENCOUNTER — Encounter (HOSPITAL_COMMUNITY)
Admission: RE | Admit: 2021-12-14 | Discharge: 2021-12-14 | Disposition: A | Discharge status: Home / Self Care | Payer: Medicaid Other | Source: Ambulatory Visit | Attending: General Surgery | Admitting: General Surgery

## 2021-12-14 ENCOUNTER — Other Ambulatory Visit: Payer: Self-pay

## 2021-12-14 VITALS — BP 135/86 | HR 73 | Temp 98.2°F | Resp 18 | Ht 61.0 in | Wt 234.8 lb

## 2021-12-14 DIAGNOSIS — Z01818 Encounter for other preprocedural examination: Secondary | ICD-10-CM

## 2021-12-14 DIAGNOSIS — Z01812 Encounter for preprocedural laboratory examination: Secondary | ICD-10-CM | POA: Insufficient documentation

## 2021-12-14 LAB — CBC
HCT: 43.6 % (ref 36.0–46.0)
Hemoglobin: 13.1 g/dL (ref 12.0–15.0)
MCH: 24.4 pg — ABNORMAL LOW (ref 26.0–34.0)
MCHC: 30 g/dL (ref 30.0–36.0)
MCV: 81.2 fL (ref 80.0–100.0)
Platelets: 309 10*3/uL (ref 150–400)
RBC: 5.37 MIL/uL — ABNORMAL HIGH (ref 3.87–5.11)
RDW: 14.3 % (ref 11.5–15.5)
WBC: 8.6 10*3/uL (ref 4.0–10.5)
nRBC: 0 % (ref 0.0–0.2)

## 2021-12-14 NOTE — Progress Notes (Signed)
PCP - Precious Gilding  ?Cardiologist - none ? ?PPM/ICD - denies ?Device Orders -  ?Rep Notified -  ? ?Chest x-ray - 06/25/21 ?EKG - 06/29/21 ?Stress Test - no ?ECHO - no ?Cardiac Cath - no ? ?Sleep Study - none ?CPAP -  ? ?Fasting Blood Sugar - n/a ?Checks Blood Sugar _____ times a day ? ?Blood Thinner Instructions:n/a ?Aspirin Instructions:n/a ? ?ERAS Protcol - yes-clear liquids until 0915 ?PRE-SURGERY Ensure or G2- Ensure ? ?COVID TEST- pt had negative Covid test 12/10/21 ? ? ?Anesthesia review: no ? ?Patient denies shortness of breath, fever, cough and chest pain at PAT appointment ? ? ?All instructions explained to the patient, with a verbal understanding of the material. Patient agrees to go over the instructions while at home for a better understanding. Patient also instructed to wear a mask when out in public prior to surgery. The opportunity to ask questions was provided. ?  ?

## 2021-12-18 NOTE — Progress Notes (Signed)
Pt updated of new arrival time 53. Shanda Bumps with interpreter services emailed to change interpreter arrival time.  ?

## 2021-12-20 NOTE — Anesthesia Preprocedure Evaluation (Addendum)
Anesthesia Evaluation  ?Patient identified by MRN, date of birth, ID band ?Patient awake ? ? ? ?Reviewed: ?Allergy & Precautions, NPO status , Patient's Chart, lab work & pertinent test results ? ?Airway ?Mallampati: II ? ?TM Distance: >3 FB ?Neck ROM: Full ? ? ? Dental ? ?(+) Chipped, Dental Advisory Given,  ?  ?Pulmonary ?neg pulmonary ROS,  ?  ?Pulmonary exam normal ?breath sounds clear to auscultation ? ? ? ? ? ? Cardiovascular ?negative cardio ROS ?Normal cardiovascular exam ?Rhythm:Regular Rate:Normal ? ? ?  ?Neuro/Psych ?negative neurological ROS ? negative psych ROS  ? GI/Hepatic ?Neg liver ROS, GERD  Medicated and Controlled,  ?Endo/Other  ?Morbid obesity (BMI 44) ? Renal/GU ?negative Renal ROS  ?negative genitourinary ?  ?Musculoskeletal ?negative musculoskeletal ROS ?(+)  ? Abdominal ?  ?Peds ? Hematology ?negative hematology ROS ?(+)   ?Anesthesia Other Findings ? ? Reproductive/Obstetrics ? ?  ? ? ? ? ? ? ? ? ? ? ? ? ? ?  ?  ? ? ? ? ? ? ?Anesthesia Physical ?Anesthesia Plan ? ?ASA: 3 ? ?Anesthesia Plan: General  ? ?Post-op Pain Management: Tylenol PO (pre-op)* and Toradol IV (intra-op)*  ? ?Induction: Intravenous ? ?PONV Risk Score and Plan: 3 and Midazolam, Dexamethasone and Ondansetron ? ?Airway Management Planned: Oral ETT ? ?Additional Equipment:  ? ?Intra-op Plan:  ? ?Post-operative Plan: Extubation in OR ? ?Informed Consent: I have reviewed the patients History and Physical, chart, labs and discussed the procedure including the risks, benefits and alternatives for the proposed anesthesia with the patient or authorized representative who has indicated his/her understanding and acceptance.  ? ? ? ?Dental advisory given ? ?Plan Discussed with: CRNA ? ?Anesthesia Plan Comments:   ? ? ? ? ? ? ?Anesthesia Quick Evaluation ? ?

## 2021-12-21 ENCOUNTER — Ambulatory Visit (HOSPITAL_COMMUNITY)
Admission: RE | Admit: 2021-12-21 | Discharge: 2021-12-21 | Disposition: A | Discharge status: Home / Self Care | Payer: Medicaid Other | Source: Ambulatory Visit | Attending: General Surgery | Admitting: General Surgery

## 2021-12-21 ENCOUNTER — Encounter (HOSPITAL_COMMUNITY)
Admission: RE | Disposition: A | Discharge status: Home / Self Care | Payer: Self-pay | Source: Ambulatory Visit | Attending: General Surgery

## 2021-12-21 ENCOUNTER — Ambulatory Visit (HOSPITAL_BASED_OUTPATIENT_CLINIC_OR_DEPARTMENT_OTHER): Payer: Medicaid Other | Admitting: Anesthesiology

## 2021-12-21 ENCOUNTER — Encounter (HOSPITAL_COMMUNITY): Payer: Self-pay | Admitting: General Surgery

## 2021-12-21 ENCOUNTER — Other Ambulatory Visit: Payer: Self-pay

## 2021-12-21 ENCOUNTER — Ambulatory Visit (HOSPITAL_COMMUNITY): Payer: Medicaid Other | Admitting: Anesthesiology

## 2021-12-21 DIAGNOSIS — K219 Gastro-esophageal reflux disease without esophagitis: Secondary | ICD-10-CM | POA: Diagnosis not present

## 2021-12-21 DIAGNOSIS — K8020 Calculus of gallbladder without cholecystitis without obstruction: Secondary | ICD-10-CM

## 2021-12-21 DIAGNOSIS — Z6841 Body Mass Index (BMI) 40.0 and over, adult: Secondary | ICD-10-CM | POA: Insufficient documentation

## 2021-12-21 DIAGNOSIS — K8010 Calculus of gallbladder with chronic cholecystitis without obstruction: Secondary | ICD-10-CM | POA: Insufficient documentation

## 2021-12-21 HISTORY — PX: CHOLECYSTECTOMY: SHX55

## 2021-12-21 LAB — POCT PREGNANCY, URINE: Preg Test, Ur: NEGATIVE

## 2021-12-21 SURGERY — LAPAROSCOPIC CHOLECYSTECTOMY
Anesthesia: General | Site: Abdomen

## 2021-12-21 MED ORDER — ROCURONIUM BROMIDE 10 MG/ML (PF) SYRINGE
PREFILLED_SYRINGE | INTRAVENOUS | Status: DC | PRN
  Administered 2021-12-21: 100 mg via INTRAVENOUS

## 2021-12-21 MED ORDER — LIDOCAINE 2% (20 MG/ML) 5 ML SYRINGE
INTRAMUSCULAR | Status: DC | PRN
  Administered 2021-12-21: 80 mg via INTRAVENOUS

## 2021-12-21 MED ORDER — ACETAMINOPHEN 500 MG PO TABS
1000.0000 mg | ORAL_TABLET | ORAL | Status: AC
  Administered 2021-12-21: 1000 mg via ORAL
  Filled 2021-12-21: qty 2

## 2021-12-21 MED ORDER — LACTATED RINGERS IV SOLN: INTRAVENOUS | Status: DC

## 2021-12-21 MED ORDER — LIDOCAINE 2% (20 MG/ML) 5 ML SYRINGE
INTRAMUSCULAR | Status: AC
  Filled 2021-12-21: qty 5

## 2021-12-21 MED ORDER — BUPIVACAINE-EPINEPHRINE (PF) 0.25% -1:200000 IJ SOLN
INTRAMUSCULAR | Status: AC
  Filled 2021-12-21: qty 30

## 2021-12-21 MED ORDER — TRAMADOL HCL 50 MG PO TABS
ORAL_TABLET | ORAL | Status: DC
Start: 2021-12-21 — End: 2021-12-21
  Filled 2021-12-21: qty 1

## 2021-12-21 MED ORDER — SUGAMMADEX SODIUM 200 MG/2ML IV SOLN
INTRAVENOUS | Status: DC | PRN
  Administered 2021-12-21: 400 mg via INTRAVENOUS

## 2021-12-21 MED ORDER — DEXMEDETOMIDINE (PRECEDEX) IN NS 20 MCG/5ML (4 MCG/ML) IV SYRINGE
PREFILLED_SYRINGE | INTRAVENOUS | Status: AC
  Filled 2021-12-21: qty 10

## 2021-12-21 MED ORDER — PROPOFOL 10 MG/ML IV BOLUS
INTRAVENOUS | Status: DC | PRN
Start: 2021-12-21 — End: 2021-12-21
  Administered 2021-12-21: 150 mg via INTRAVENOUS

## 2021-12-21 MED ORDER — SUGAMMADEX SODIUM 500 MG/5ML IV SOLN
INTRAVENOUS | Status: AC
  Filled 2021-12-21: qty 5

## 2021-12-21 MED ORDER — FENTANYL CITRATE (PF) 250 MCG/5ML IJ SOLN
INTRAMUSCULAR | Status: DC | PRN
  Administered 2021-12-21 (×3): 50 ug via INTRAVENOUS
  Administered 2021-12-21: 100 ug via INTRAVENOUS

## 2021-12-21 MED ORDER — MIDAZOLAM HCL 2 MG/2ML IJ SOLN
INTRAMUSCULAR | Status: AC
  Filled 2021-12-21: qty 2

## 2021-12-21 MED ORDER — 0.9 % SODIUM CHLORIDE (POUR BTL) OPTIME
TOPICAL | Status: DC | PRN
  Administered 2021-12-21: 1000 mL

## 2021-12-21 MED ORDER — CHLORHEXIDINE GLUCONATE CLOTH 2 % EX PADS: 6.0000 | MEDICATED_PAD | Freq: Once | CUTANEOUS | Status: DC

## 2021-12-21 MED ORDER — ENSURE PRE-SURGERY PO LIQD: 296.0000 mL | Freq: Once | ORAL | Status: DC

## 2021-12-21 MED ORDER — TRAMADOL HCL 50 MG PO TABS
50.0000 mg | ORAL_TABLET | Freq: Four times a day (QID) | ORAL | 0 refills | Status: DC | PRN
  Filled 2021-12-21: qty 20, 5d supply, fill #0

## 2021-12-21 MED ORDER — FENTANYL CITRATE (PF) 250 MCG/5ML IJ SOLN
INTRAMUSCULAR | Status: AC
  Filled 2021-12-21: qty 5

## 2021-12-21 MED ORDER — SODIUM CHLORIDE 0.9 % IR SOLN
Status: DC | PRN
  Administered 2021-12-21: 1000 mL

## 2021-12-21 MED ORDER — ONDANSETRON HCL 4 MG/2ML IJ SOLN
INTRAMUSCULAR | Status: DC | PRN
  Administered 2021-12-21: 4 mg via INTRAVENOUS

## 2021-12-21 MED ORDER — DEXAMETHASONE SODIUM PHOSPHATE 10 MG/ML IJ SOLN
INTRAMUSCULAR | Status: DC | PRN
Start: 2021-12-21 — End: 2021-12-21
  Administered 2021-12-21: 10 mg via INTRAVENOUS

## 2021-12-21 MED ORDER — ORAL CARE MOUTH RINSE: 15.0000 mL | Freq: Once | OROMUCOSAL | Status: AC

## 2021-12-21 MED ORDER — KETOROLAC TROMETHAMINE 30 MG/ML IJ SOLN
INTRAMUSCULAR | Status: AC
  Filled 2021-12-21: qty 1

## 2021-12-21 MED ORDER — DEXAMETHASONE SODIUM PHOSPHATE 10 MG/ML IJ SOLN
INTRAMUSCULAR | Status: AC
  Filled 2021-12-21: qty 1

## 2021-12-21 MED ORDER — FENTANYL CITRATE (PF) 100 MCG/2ML IJ SOLN
INTRAMUSCULAR | Status: AC
  Filled 2021-12-21: qty 2

## 2021-12-21 MED ORDER — ROCURONIUM BROMIDE 10 MG/ML (PF) SYRINGE
PREFILLED_SYRINGE | INTRAVENOUS | Status: AC
  Filled 2021-12-21: qty 10

## 2021-12-21 MED ORDER — MIDAZOLAM HCL 2 MG/2ML IJ SOLN
INTRAMUSCULAR | Status: DC | PRN
  Administered 2021-12-21: 2 mg via INTRAVENOUS

## 2021-12-21 MED ORDER — KETOROLAC TROMETHAMINE 30 MG/ML IJ SOLN
INTRAMUSCULAR | Status: DC | PRN
  Administered 2021-12-21: 30 mg via INTRAVENOUS

## 2021-12-21 MED ORDER — ACETAMINOPHEN 500 MG PO TABS: 1000.0000 mg | ORAL_TABLET | Freq: Once | ORAL | Status: DC

## 2021-12-21 MED ORDER — CHLORHEXIDINE GLUCONATE 0.12 % MT SOLN
15.0000 mL | Freq: Once | OROMUCOSAL | Status: AC
  Administered 2021-12-21: 15 mL via OROMUCOSAL
  Filled 2021-12-21: qty 15

## 2021-12-21 MED ORDER — DEXMEDETOMIDINE (PRECEDEX) IN NS 20 MCG/5ML (4 MCG/ML) IV SYRINGE
PREFILLED_SYRINGE | INTRAVENOUS | Status: DC | PRN
  Administered 2021-12-21: 8 ug via INTRAVENOUS

## 2021-12-21 MED ORDER — FENTANYL CITRATE (PF) 100 MCG/2ML IJ SOLN
25.0000 ug | INTRAMUSCULAR | Status: DC | PRN
  Administered 2021-12-21 (×4): 25 ug via INTRAVENOUS

## 2021-12-21 MED ORDER — TRAMADOL HCL 50 MG PO TABS
50.0000 mg | ORAL_TABLET | Freq: Once | ORAL | Status: AC
  Administered 2021-12-21: 50 mg via ORAL

## 2021-12-21 MED ORDER — CEFAZOLIN SODIUM-DEXTROSE 2-4 GM/100ML-% IV SOLN
2.0000 g | INTRAVENOUS | Status: AC
  Administered 2021-12-21: 2 g via INTRAVENOUS
  Filled 2021-12-21: qty 100

## 2021-12-21 MED ORDER — ONDANSETRON HCL 4 MG/2ML IJ SOLN
INTRAMUSCULAR | Status: AC
  Filled 2021-12-21: qty 2

## 2021-12-21 MED ORDER — BUPIVACAINE-EPINEPHRINE (PF) 0.25% -1:200000 IJ SOLN
INTRAMUSCULAR | Status: DC | PRN
  Administered 2021-12-21: 15 mL

## 2021-12-21 SURGICAL SUPPLY — 41 items
BAG COUNTER SPONGE SURGICOUNT (BAG) ×2 IMPLANT
CANISTER SUCT 3000ML PPV (MISCELLANEOUS) ×2 IMPLANT
CHLORAPREP W/TINT 26 (MISCELLANEOUS) ×2 IMPLANT
CLIP LIGATING HEMO O LOK GREEN (MISCELLANEOUS) ×2 IMPLANT
COVER SURGICAL LIGHT HANDLE (MISCELLANEOUS) ×2 IMPLANT
COVER TRANSDUCER ULTRASND (DRAPES) ×2 IMPLANT
DERMABOND ADVANCED (GAUZE/BANDAGES/DRESSINGS) ×1
DERMABOND ADVANCED .7 DNX12 (GAUZE/BANDAGES/DRESSINGS) ×1 IMPLANT
ELECT REM PT RETURN 9FT ADLT (ELECTROSURGICAL) ×2
ELECTRODE REM PT RTRN 9FT ADLT (ELECTROSURGICAL) ×1 IMPLANT
GLOVE BIO SURGEON STRL SZ7.5 (GLOVE) ×2 IMPLANT
GLOVE SURG SYN 7.5  E (GLOVE) ×1
GLOVE SURG SYN 7.5 E (GLOVE) ×1 IMPLANT
GLOVE SURG SYN 7.5 PF PI (GLOVE) ×1 IMPLANT
GOWN STRL REUS W/ TWL LRG LVL3 (GOWN DISPOSABLE) ×2 IMPLANT
GOWN STRL REUS W/ TWL XL LVL3 (GOWN DISPOSABLE) ×1 IMPLANT
GOWN STRL REUS W/TWL LRG LVL3 (GOWN DISPOSABLE) ×2
GOWN STRL REUS W/TWL XL LVL3 (GOWN DISPOSABLE) ×1
GRASPER SUT TROCAR 14GX15 (MISCELLANEOUS) ×2 IMPLANT
KIT BASIN OR (CUSTOM PROCEDURE TRAY) ×2 IMPLANT
KIT TURNOVER KIT B (KITS) ×2 IMPLANT
NDL INSUFFLATION 14GA 120MM (NEEDLE) ×1 IMPLANT
NEEDLE INSUFFLATION 14GA 120MM (NEEDLE) ×2 IMPLANT
NS IRRIG 1000ML POUR BTL (IV SOLUTION) ×2 IMPLANT
PAD ARMBOARD 7.5X6 YLW CONV (MISCELLANEOUS) ×2 IMPLANT
POUCH LAPAROSCOPIC INSTRUMENT (MISCELLANEOUS) ×2 IMPLANT
POUCH RETRIEVAL ECOSAC 10 (ENDOMECHANICALS) IMPLANT
POUCH RETRIEVAL ECOSAC 10MM (ENDOMECHANICALS) ×1
SCISSORS LAP 5X35 DISP (ENDOMECHANICALS) ×2 IMPLANT
SET IRRIG TUBING LAPAROSCOPIC (IRRIGATION / IRRIGATOR) ×2 IMPLANT
SET TUBE SMOKE EVAC HIGH FLOW (TUBING) ×2 IMPLANT
SLEEVE ENDOPATH XCEL 5M (ENDOMECHANICALS) ×3 IMPLANT
SPECIMEN JAR SMALL (MISCELLANEOUS) ×2 IMPLANT
SUT MNCRL AB 4-0 PS2 18 (SUTURE) ×2 IMPLANT
TOWEL GREEN STERILE (TOWEL DISPOSABLE) ×2 IMPLANT
TOWEL GREEN STERILE FF (TOWEL DISPOSABLE) ×2 IMPLANT
TRAY LAPAROSCOPIC MC (CUSTOM PROCEDURE TRAY) ×2 IMPLANT
TROCAR XCEL NON-BLD 11X100MML (ENDOMECHANICALS) ×2 IMPLANT
TROCAR XCEL NON-BLD 5MMX100MML (ENDOMECHANICALS) ×2 IMPLANT
WARMER LAPAROSCOPE (MISCELLANEOUS) ×2 IMPLANT
WATER STERILE IRR 1000ML POUR (IV SOLUTION) ×2 IMPLANT

## 2021-12-21 NOTE — Discharge Instructions (Signed)
[facility] ______[facility] LAPAROSCOPIC SURGERY: POST OP INSTRUCTIONS Always review your discharge instruction sheet given to you by the facility where your surgery was performed. IF YOU HAVE DISABILITY OR FAMILY LEAVE FORMS, YOU MUST BRING THEM TO THE OFFICE FOR PROCESSING.   DO NOT GIVE THEM TO YOUR DOCTOR.  A prescription for pain medication may be given to you upon discharge.  Take your pain medication as prescribed, if needed.  If narcotic pain medicine is not needed, then you may take acetaminophen (Tylenol) or ibuprofen (Advil) as needed. Take your usually prescribed medications unless otherwise directed. If you need a refill on your pain medication, please contact your pharmacy.  They will contact our office to request authorization. Prescriptions will not be filled after 5pm or on week-ends. You should follow a light diet the first few days after arrival home, such as soup and crackers, etc.  Be sure to include lots of fluids daily. Most patients will experience some swelling and bruising in the area of the incisions.  Ice packs will help.  Swelling and bruising can take several days to resolve.  It is common to experience some constipation if taking pain medication after surgery.  Increasing fluid intake and taking a stool softener (such as Colace) will usually help or prevent this problem from occurring.  A mild laxative (Milk of Magnesia or Miralax) should be taken according to package instructions if there are no bowel movements after 48 hours. Unless discharge instructions indicate otherwise, you may remove your bandages 24-48 hours after surgery, and you may shower at that time.  You may have steri-strips (small skin tapes) in place directly over the incision.  These strips should be left on the skin for 7-10 days.  If your surgeon used skin glue on the incision, you may shower in 24 hours.  The glue will flake off over the next 2-3 weeks.  Any sutures or staples will be  removed at the office during your follow-up visit. ACTIVITIES:  You may resume regular (light) daily activities beginning the next day--such as daily self-care, walking, climbing stairs--gradually increasing activities as tolerated.  You may have sexual intercourse when it is comfortable.  Refrain from any heavy lifting or straining until approved by your doctor. You may drive when you are no longer taking prescription pain medication, you can comfortably wear a seatbelt, and you can safely maneuver your car and apply brakes. RETURN TO WORK:  __________________________________________________________ You should see your doctor in the office for a follow-up appointment approximately 2-3 weeks after your surgery.  Make sure that you call for this appointment within a day or two after you arrive home to insure a convenient appointment time. OTHER INSTRUCTIONS: __________________________________________________________________________________________________________________________ __________________________________________________________________________________________________________________________ WHEN TO CALL YOUR DOCTOR: Fever over 101.0 Inability to urinate Continued bleeding from incision. Increased pain, redness, or drainage from the incision. Increasing abdominal pain  The clinic staff is available to answer your questions during regular business hours.  Please don't hesitate to call and ask to speak to one of the nurses for clinical concerns.  If you have a medical emergency, go to the nearest emergency room or call 911.  A surgeon from [facility] is always on call at the hospital. [address], [address], [place], [place]  [address] ? [address] [address], , [place]   [address] [phone] ? [phone] ? FAX [phone] Web site: [email or web address]  

## 2021-12-21 NOTE — Anesthesia Procedure Notes (Signed)
Procedure Name: Intubation ?Date/Time: 12/21/2021 10:31 AM ?Performed by: Dorann Lodge, CRNA ?Pre-anesthesia Checklist: Patient identified, Emergency Drugs available, Suction available and Patient being monitored ?Patient Re-evaluated:Patient Re-evaluated prior to induction ?Oxygen Delivery Method: Circle System Utilized ?Preoxygenation: Pre-oxygenation with 100% oxygen ?Induction Type: IV induction ?Ventilation: Mask ventilation without difficulty ?Laryngoscope Size: Mac and 3 ?Grade View: Grade I ?Tube type: Oral ?Tube size: 7.0 mm ?Number of attempts: 1 ?Airway Equipment and Method: Stylet ?Placement Confirmation: ETT inserted through vocal cords under direct vision, positive ETCO2 and breath sounds checked- equal and bilateral ?Secured at: 21 cm ?Tube secured with: Tape ?Dental Injury: Teeth and Oropharynx as per pre-operative assessment  ? ? ? ? ?

## 2021-12-21 NOTE — H&P (Signed)
Chief Complaint: Abdominal Pain ?  ?  ?  ?History of Present Illness: ?Deborah Chandler is a 41 y.o. female who is seen today as an office consultation at the request of Dr. Darl Householder for evaluation of Abdominal Pain ?.   ?  ?Patient is a 41 year old female, Spanish-speaking, who comes in secondary to right upper quadrant/epigastric pain.  Patient was previously in the ER secondary to pain.  She underwent ultrasound and CT scan.  CT scan was significant for gallstones however no signs of cholecystitis.  Patient had normal LFTs. ?  ?Patient states that the pain usually is with eating.  She states that the pain last several hours thereafter.  States this is associated with nausea, vomiting and dizziness. ?  ?  ?  ?  ?Review of Systems: ?A complete review of systems was obtained from the patient.  I have reviewed this information and discussed as appropriate with the patient.  See HPI as well for other ROS. ?  ?Review of Systems  ?Constitutional: Negative for fever.  ?HENT: Negative for congestion.   ?Eyes: Negative for blurred vision.  ?Respiratory: Negative for cough, shortness of breath and wheezing.   ?Cardiovascular: Negative for chest pain and palpitations.  ?Gastrointestinal: Positive for abdominal pain, heartburn, nausea and vomiting.  ?Genitourinary: Negative for dysuria.  ?Musculoskeletal: Negative for myalgias.  ?Skin: Negative for rash.  ?Neurological: Negative for dizziness and headaches.  ?Psychiatric/Behavioral: Negative for depression and suicidal ideas.  ?All other systems reviewed and are negative. ?  ?  ?  ?Medical History: ?Past Medical History ?History reviewed. No pertinent past medical history. ?  ?  ?There is no problem list on file for this patient. ?  ?  ?Past Surgical History ?History reviewed. No pertinent surgical history. ?  ?  ?Allergies ?Allergies ?Allergen           Reactions ?           Metronidazole Nausea ?                          Dizziness ?  ?  ?  ?No current outpatient medications  on file prior to visit. ?  ?No current facility-administered medications on file prior to visit. ?  ?  ?Family History ?History reviewed. No pertinent family history. ?  ?  ?Social History ?  ?Tobacco Use ?Smoking Status           Never ?Smokeless Tobacco   Never ?  ?  ?Social History ?Social History ?  ?  ?Socioeconomic History ?           Marital status:  Married ?Tobacco Use ?           Smoking status:          Never ?           Smokeless tobacco:    Never ?Vaping Use ?           Vaping Use:    Never used ?Substance and Sexual Activity ?           Alcohol use:    Yes ?           Drug use:        Never ?  ?  ?  ?Objective: ?  ?  ?Vitals: ?             10/22/21 1121 ?BP:      (!) 142/90 ?Pulse:  76 ?Temp:  36.7 ?C (98 ?F) ?SpO2:  98% ?Weight:            (!) 109.2 kg (240 lb 12.8 oz) ?Height: 160 cm (5\' 3" ) ?  ?Body mass index is 42.66 kg/m?. ?  ?Physical Exam ?Constitutional:   ?   Appearance: Normal appearance.  ?HENT:  ?   Head: Normocephalic and atraumatic.  ?   Mouth/Throat:  ?   Mouth: Mucous membranes are moist.  ?   Pharynx: Oropharynx is clear.  ?Eyes:  ?   General: No scleral icterus. ?   Pupils: Pupils are equal, round, and reactive to light.  ?Cardiovascular:  ?   Rate and Rhythm: Normal rate and regular rhythm.  ?   Pulses: Normal pulses.  ?   Heart sounds: No murmur heard. ?  No friction rub. No gallop.  ?Pulmonary:  ?   Effort: Pulmonary effort is normal. No respiratory distress.  ?   Breath sounds: Normal breath sounds. No stridor.  ?Abdominal:  ?   General: Abdomen is flat.  ?Musculoskeletal:     ?   General: No swelling.  ?Skin: ?   General: Skin is warm.  ?Neurological:  ?   General: No focal deficit present.  ?   Mental Status: She is alert and oriented to person, place, and time. Mental status is at baseline.  ?Psychiatric:     ?   Mood and Affect: Mood normal.     ?   Thought Content: Thought content normal.     ?   Judgment: Judgment normal.  ?  ?  ?  ?Assessment and Plan: ?Diagnoses and all  orders for this visit: ?  ?Symptomatic cholelithiasis ?  ?  ?Deborah Chandler is a 42 y.o. female  ?  ?1.          We will proceed to the OR for a lap cholecystectomy. ?2.         All risks and benefits were discussed with the patient to generally include: infection, bleeding, possible need for post op ERCP, damage to the bile ducts, and bile leak. Alternatives were offered and described.  All questions were answered and the patient voiced understanding of the procedure and wishes to proceed at this point with a laparoscopic cholecystectomy ?  ?  ?  ?  ?  ?No follow-ups on file. ?  ?Ralene Ok, MD, FACS ?[facility], [place] ?General & Minimally Invasive Surgery ? ?

## 2021-12-21 NOTE — Transfer of Care (Signed)
Immediate Anesthesia Transfer of Care Note ? ?Patient: Deborah Chandler ? ?Procedure(s) Performed: LAPAROSCOPIC CHOLECYSTECTOMY (Abdomen) ? ?Patient Location: PACU ? ?Anesthesia Type:General ? ?Level of Consciousness: awake and drowsy ? ?Airway & Oxygen Therapy: Patient Spontanous Breathing and Patient connected to nasal cannula oxygen ? ?Post-op Assessment: Report given to RN and Post -op Vital signs reviewed and stable ? ?Post vital signs: Reviewed and stable ? ?Last Vitals:  ?Vitals Value Taken Time  ?BP    ?Temp 36.6 ?C 12/21/21 1145  ?Pulse 82 12/21/21 1147  ?Resp 20 12/21/21 1147  ?SpO2 92 % 12/21/21 1147  ?Vitals shown include unvalidated device data. ? ?Last Pain:  ?Vitals:  ? 12/21/21 0845  ?TempSrc:   ?PainSc: 0-No pain  ?   ? ?  ? ?Complications: No notable events documented. ?

## 2021-12-21 NOTE — Op Note (Signed)
12/21/2021 ? ?11:24 AM ? ?PATIENT:  Deborah Chandler  41 y.o. female ? ?PRE-OPERATIVE DIAGNOSIS:  GALLSTONES ? ?POST-OPERATIVE DIAGNOSIS:  GALLSTONES ? ?PROCEDURE:  Procedure(s): ?LAPAROSCOPIC CHOLECYSTECTOMY (N/A) ? ?SURGEON:  Surgeon(s) and Role: ?   Ralene Ok, MD - Primary ? ? ?ASSISTANTS: Dr. Cameron Sprang, PGY-5  ? ?ANESTHESIA:   local and general ? ?EBL:  minimal  ? ?BLOOD ADMINISTERED:none ? ?DRAINS: none  ? ?LOCAL MEDICATIONS USED:  BUPIVICAINE  ? ?SPECIMEN:  Source of Specimen:  gallbladder ? ?DISPOSITION OF SPECIMEN:  PATHOLOGY ? ?COUNTS:  YES ? ?TOURNIQUET:  * No tourniquets in log * ? ?DICTATION: .Dragon Dictation ?The patient was taken to the operating and placed in the supine position with bilateral SCDs in place.  The patient was prepped and draped in the usual sterile fashion. A time out was called and all facts were verified. A pneumoperitoneum was obtained via A Veress needle technique to a pressure of 62mm of mercury. ? ?A 86mm trochar was then placed in the right upper quadrant under visualization, and there were no injuries to any abdominal organs. A 11 mm port was then placed in the umbilical region after infiltrating with local anesthesia under direct visualization. A second and third epigastric port and right lower quadrant port placement under direct visualization, respectively.   ? ?The gallbladder was identified and retracted, the peritoneum was then sharply dissected from the gallbladder and this dissection was carried down to Calot's triangle. The cystic duct was identified and stripped away circumferentially and seen going into the gallbladder 360?, the critical angle was obtained.  2 clips were placed proximally one distally and the cystic duct transected. The cystic artery was identified and 2 clips placed proximally and one distally and transected.  We then proceeded to remove the gallbladder off the hepatic fossa with Bovie cautery. A retrieval bag was then placed in the  abdomen and gallbladder placed in the bag. The hepatic fossa was then reexamined and hemostasis was achieved with Bovie cautery and was excellent at the end of the case.  ? ?The subhepatic fossa and perihepatic fossa was then irrigated until the effluent was clear.  The gallbladder and bag were removed from the abdominal cavity. The 11 mm trocar fascia was reapproximated with the Endo Close #1 Vicryl x3.  The pneumoperitoneum was evacuated and all trochars removed under direct visulalization.  The skin was then closed with 4-0 Monocryl and the skin dressed with Dermabond.   ? ?The patient was awaken from general anesthesia and taken to the recovery room in stable condition. ? ?I was personally present during the key and critical portions of this procedure and immediately available throughout the entire procedure, as documented in my operative note. ? ? ?PLAN OF CARE: Discharge to home after PACU ? ?PATIENT DISPOSITION:  PACU - hemodynamically stable. ?  ?Delay start of Pharmacological VTE agent (>24hrs) due to surgical blood loss or risk of bleeding: not applicable ? ?

## 2021-12-21 NOTE — Anesthesia Postprocedure Evaluation (Signed)
Anesthesia Post Note ? ?Patient: Deborah Chandler ? ?Procedure(s) Performed: LAPAROSCOPIC CHOLECYSTECTOMY (Abdomen) ? ?  ? ?Patient location during evaluation: PACU ?Anesthesia Type: General ?Level of consciousness: awake and alert ?Pain management: pain level controlled ?Vital Signs Assessment: post-procedure vital signs reviewed and stable ?Respiratory status: spontaneous breathing, nonlabored ventilation, respiratory function stable and patient connected to nasal cannula oxygen ?Cardiovascular status: blood pressure returned to baseline and stable ?Postop Assessment: no apparent nausea or vomiting ?Anesthetic complications: no ? ? ?No notable events documented. ? ?Last Vitals:  ?Vitals:  ? 12/21/21 1215 12/21/21 1230  ?BP: (!) 155/102 (!) 156/94  ?Pulse: 78 60  ?Resp: 18 20  ?Temp:    ?SpO2: 96% 93%  ?  ?Last Pain:  ?Vitals:  ? 12/21/21 1226  ?TempSrc:   ?PainSc: 7   ? ? ?  ?  ?  ?  ?  ?  ? ?[name] ? ? ? ? ?

## 2021-12-22 ENCOUNTER — Encounter (HOSPITAL_COMMUNITY): Payer: Self-pay | Admitting: General Surgery

## 2021-12-22 LAB — SURGICAL PATHOLOGY

## 2021-12-30 DIAGNOSIS — R109 Unspecified abdominal pain: Secondary | ICD-10-CM | POA: Diagnosis not present

## 2022-02-02 ENCOUNTER — Ambulatory Visit: Payer: Medicaid Other | Admitting: Family Medicine

## 2022-02-02 ENCOUNTER — Encounter: Payer: Self-pay | Admitting: Family Medicine

## 2022-02-02 VITALS — BP 148/82 | HR 82 | Ht 61.0 in | Wt 234.5 lb

## 2022-02-02 DIAGNOSIS — Z975 Presence of (intrauterine) contraceptive device: Secondary | ICD-10-CM | POA: Insufficient documentation

## 2022-02-02 DIAGNOSIS — I10 Essential (primary) hypertension: Secondary | ICD-10-CM | POA: Diagnosis not present

## 2022-02-02 DIAGNOSIS — N921 Excessive and frequent menstruation with irregular cycle: Secondary | ICD-10-CM | POA: Diagnosis not present

## 2022-02-02 DIAGNOSIS — N939 Abnormal uterine and vaginal bleeding, unspecified: Secondary | ICD-10-CM | POA: Diagnosis not present

## 2022-02-02 MED ORDER — AMLODIPINE BESYLATE 5 MG PO TABS: 5.0000 mg | ORAL_TABLET | Freq: Every day | ORAL | 3 refills | Status: DC

## 2022-02-02 NOTE — Assessment & Plan Note (Signed)
Discussed options with patient at length.  Patient is not interested in IUD, ibuprofen and OCPs have not worked for her.  I recommend for neck steps patient to go back to gynecology and discuss surgical options when she is ready to do so.  Her hemoglobin is stable.

## 2022-02-02 NOTE — Progress Notes (Signed)
    SUBJECTIVE:   CHIEF COMPLAINT / HPI:   Encounter to discuss birth control: 41 year old patient is interested in removal of Nexplanon.  It was placed on February 06, 2018.  Patient has had BTL for birth control.  She has a history of menorrhagia with her periods, can sometimes last 15 days long with heavy cramping.  She has tried OCPs, and Nexplanon, as well as ibuprofen 800 mg q6h x4 days which did not stop the bleeding.  She is not interested in IUD. Her hgb was stable on 12/27/20, her LMP was 5/18-23.  She is up-to-date on Pap smear.  Discussed options for bleeding control at length with patient.  She remains uninterested in IUD.  She just recently had cholecystectomy last month, wishes to recover from this of a bit more before considering surgical ablation.   Blood pressure: BP not at goal today, 148/72. Chart review shows elevated bps to 140s-150s/80s-90s on 3 occasions this year. Discussed medication options versus lifestyle changes. Discussed causes and natural progression of disease at length.  Patient is asymptomatic.  Spanish interpreter used throughout visit K sounds good I do not find will survive  PERTINENT  PMH / PSH: Hypertension, class III obesity, abnormal uterine bleeding, status post cholecystectomy  OBJECTIVE:   BP (!) 148/82   Pulse 82   Ht 5\' 1"  (1.549 m)   Wt 234 lb 8 oz (106.4 kg)   SpO2 98%   BMI 44.31 kg/m   Nursing note and vitals reviewed GEN: Age-appropriate, Latina woman, resting comfortably in chair, NAD, class III obesity Cardiac: Regular rate and rhythm. Normal S1/S2. No murmurs, rubs, or gallops appreciated. 2+ radial pulses. Lungs: Clear bilaterally to ascultation. No increased WOB, no accessory muscle usage. No w/r/r. Neuro: AOx3  Ext: no edema Psych: Pleasant and appropriate  ASSESSMENT/PLAN:   Abnormal uterine bleeding (AUB) Discussed options with patient at length.  Patient is not interested in IUD, ibuprofen and OCPs have not worked for her.  I  recommend for neck steps patient to go back to gynecology and discuss surgical options when she is ready to do so.  Her hemoglobin is stable.  Breakthrough bleeding on Nexplanon Nexplanon expired 1 year ago as it was placed in June 2019.  Time for removal.  We discussed options, see AUB problem above.  Patient has had BTL.  Scheduled for 40-minute procedure slot on June 5.  Hypertension Patient has class I hypertension with multiple readings in the 140s to 150s since 2023.  Discussed treatment options and natural course of disease at length with patient.  Encouraged lifestyle changes.  Patient is interested in starting medication, will start amlodipine 5 mg.  Discussed side effects that are common such as headache and leg swelling.  Patient to call if any of these problems arise and can follow-up sooner if needed.  Please check blood pressure at upcoming appointment.     2024, MD [facility]

## 2022-02-02 NOTE — Patient Instructions (Addendum)
It was a pleasure to see you today!  We have scheduled you an appointment for removal of your nexplanon: June 5th at 2:15 PM For heavy bleeding: consider going back to talk to the gynecologist about surgical options to decrease bleeding For blood pressure: start taking amlodipine 5 mg daily. Please call us [phone] if you have head ache and leg swelling.  Be Well,  Dr. Ruthe Mannan un placer verte hoy!  1. Te hemos agendado cita para retiro de tu nexplanon: 5 de Junio a las 2:15 PM 2. Para sangrado abundante: considere volver a hablar con el gineclogo sobre las opciones quirrgicas para disminuir el sangrado 3. Para la presin arterial: comience a tomar amlodipina 5 mg al da. Llmenos al [phone] si tiene dolor de cabeza e hinchazn en las piernas.  [place],  [employer]

## 2022-02-02 NOTE — Assessment & Plan Note (Signed)
Patient has class I hypertension with multiple readings in the 140s to 150s since 2023.  Discussed treatment options and natural course of disease at length with patient.  Encouraged lifestyle changes.  Patient is interested in starting medication, will start amlodipine 5 mg.  Discussed side effects that are common such as headache and leg swelling.  Patient to call if any of these problems arise and can follow-up sooner if needed.  Please check blood pressure at upcoming appointment.

## 2022-02-02 NOTE — Assessment & Plan Note (Signed)
Nexplanon expired 1 year ago as it was placed in June 2019.  Time for removal.  We discussed options, see AUB problem above.  Patient has had BTL.  Scheduled for 40-minute procedure slot on June 5.

## 2022-02-08 ENCOUNTER — Ambulatory Visit: Payer: Medicaid Other | Admitting: Student

## 2022-02-16 NOTE — Progress Notes (Deleted)
     SUBJECTIVE:   CHIEF COMPLAINT / HPI:   Deborah Chandler is a 40 y.o. female presents for nexplanon removal    ***  Flowsheet Row Office Visit from 10/15/2021 in [facility]  PHQ-9 Total Score 2        Health Maintenance Due  Topic   COVID-19 Vaccine (1)   Hepatitis C Screening       PERTINENT  PMH / PSH:   OBJECTIVE:   There were no vitals taken for this visit.   General: Alert, no acute distress Cardio: Normal S1 and S2, RRR, no r/m/g Pulm: CTAB, normal work of breathing Abdomen: Bowel sounds normal. Abdomen soft and non-tender.  Extremities: No peripheral edema.  Neuro: Cranial nerves grossly intact   ASSESSMENT/PLAN:   No problem-specific Assessment & Plan notes found for this encounter.    Ragan Reale, MD PGY-3 [facility]  

## 2022-02-17 ENCOUNTER — Ambulatory Visit: Payer: Medicaid Other | Admitting: Family Medicine

## 2022-03-05 ENCOUNTER — Ambulatory Visit: Payer: Medicaid Other | Admitting: Family Medicine

## 2022-03-05 DIAGNOSIS — Z3046 Encounter for surveillance of implantable subdermal contraceptive: Secondary | ICD-10-CM | POA: Insufficient documentation

## 2022-03-05 NOTE — Assessment & Plan Note (Deleted)
PROCEDURE NOTE: NEXPLANON  REMOVAL Patient given informed consent and signed copy in the chart.  arm area prepped and draped in the usual sterile fashion. Three cc of lidocaine without epinephrine 1% used for local anesthesia. A small stab incision was made close to the nexplanon with scalpel. Hemostats were used to withdraw the nexplanon. A small bandage was applied over a steri strip  No complications.Patient given follow up instructions should she experience redness, swelling at sight or fever in the next 24 hours. Patient was reminded this totally removes her nexplanon contraceptive devise. (she can now potentially conceive)

## 2022-03-05 NOTE — Progress Notes (Deleted)
     SUBJECTIVE:   CHIEF COMPLAINT / HPI:   Deborah Chandler is a 41 y.o. female presents for nexplanon removal    ***  Flowsheet Row Office Visit from 10/15/2021 in [facility]  PHQ-9 Total Score 2        Health Maintenance Due  Topic   COVID-19 Vaccine (1)   Hepatitis C Screening       PERTINENT  PMH / PSH:   OBJECTIVE:   There were no vitals taken for this visit.   General: Alert, no acute distress Cardio: Normal S1 and S2, RRR, no r/m/g Pulm: CTAB, normal work of breathing Abdomen: Bowel sounds normal. Abdomen soft and non-tender.  Extremities: No peripheral edema.  Neuro: Cranial nerves grossly intact   ASSESSMENT/PLAN:   No problem-specific Assessment & Plan notes found for this encounter.    Towanda Octave, MD PGY-3 [facility]

## 2022-03-11 NOTE — Progress Notes (Deleted)
    SUBJECTIVE:   CHIEF COMPLAINT / HPI: nexplanon removal   Patient presents for nexplanon removal  Device was placed in ***   PERTINENT  PMH / PSH:  Nexplanon for contraception   OBJECTIVE:   There were no vitals taken for this visit.  Physical Exam Constitutional:      General: She is not in acute distress.    Appearance: Normal appearance. She is not ill-appearing, toxic-appearing or diaphoretic.  Pulmonary:     Effort: Pulmonary effort is normal.  Neurological:     Mental Status: She is alert.      ASSESSMENT/PLAN:   No problem-specific Assessment & Plan notes found for this encounter.   PROCEDURE NOTE: NEXPLANON  REMOVAL Patient given informed consent and signed copy in the chart. *** arm area prepped and draped in the usual sterile fashion. Three cc of lidocaine without epinephrine 1% used for local anesthesia. A small stab incision was made close to the nexplanon with scalpel. Hemostats were used to withdraw the nexplanon. A small bandage was applied over a steri strip  No complications.Patient given follow up instructions should she experience redness, swelling at sight or fever in the next 24 hours. Patient was reminded this totally removes her nexplanon contraceptive devise. (she can now potentially conceive)   Ronnald Ramp, MD [facility]

## 2022-03-12 ENCOUNTER — Ambulatory Visit: Payer: Medicaid Other | Admitting: Family Medicine

## 2022-03-14 NOTE — Progress Notes (Deleted)
    SUBJECTIVE:   CHIEF COMPLAINT / HPI:   Pt here today for nexplanon removal. Nexplannon was placed in 2019. She had a BTL previously for birth control.   HTN Pt started on amplodipine 5 mg on 02/02/22    PERTINENT  PMH / PSH: ***  OBJECTIVE:   There were no vitals taken for this visit. ***  General: NAD, pleasant, able to participate in exam Cardiac: RRR, no murmurs. Respiratory: CTAB, normal effort, No wheezes, rales or rhonchi Abdomen: Bowel sounds present, nontender, nondistended, no hepatosplenomegaly. Extremities: no edema or cyanosis. Skin: warm and dry, no rashes noted Neuro: alert, no obvious focal deficits Psych: Normal affect and mood  ASSESSMENT/PLAN:   No problem-specific Assessment & Plan notes found for this encounter.     Dr. Erick Alley, DO [facility]    {    This will disappear when note is signed, click to select method of visit    :1}

## 2022-03-15 ENCOUNTER — Ambulatory Visit: Payer: Medicaid Other | Admitting: Student

## 2022-04-05 ENCOUNTER — Ambulatory Visit: Payer: Medicaid Other | Admitting: Student

## 2022-04-05 ENCOUNTER — Encounter: Payer: Self-pay | Admitting: Student

## 2022-04-05 VITALS — BP 102/90 | HR 72 | Ht 61.0 in | Wt 237.2 lb

## 2022-04-05 DIAGNOSIS — N939 Abnormal uterine and vaginal bleeding, unspecified: Secondary | ICD-10-CM

## 2022-04-05 DIAGNOSIS — I10 Essential (primary) hypertension: Secondary | ICD-10-CM | POA: Diagnosis not present

## 2022-04-05 NOTE — Patient Instructions (Signed)
It was great to see you! Thank you for allowing me to participate in your care!   Our plans for today:  - We will call you to schedule future appointments and discuss the need for a pelvis ultrasound and endometrial biopsy -La llamaremos para programar citas futuras y discutir la necesidad de una ecografa plvica y Neomia Dear biopsia endometrial.  Take care and seek immediate care sooner if you develop any concerns.   Dr. Erick Alley, DO [facility]

## 2022-04-05 NOTE — Progress Notes (Unsigned)
    SUBJECTIVE:   CHIEF COMPLAINT / HPI:   AUB  Nexplanon removal  Pt here today for Nexplanon removal which was placed 02/06/2018. She has had BTL for birth control year ago in [place]. Nexplanon was placed for AUB and she was previously treated with Prometrium monthly.  Her periods still last up to 15 days at a time, or heavy per patient and painful.  Patient had pelvic ultrasound on 12/03/2019 per OB note which showed no fibroids identified, did show diffuse scattered cystic changes suggestive of adenomyosis.  CT abdomen pelvis from 06/25/2021 showed unremarkable uterus and cyst of left ovary.  Patient has had 2 referrals to OB/GYN placed since September 2022 but patient has not gone to OB/GYN.  HTN Pt started on Amlodipine 5 mg at last visit on 02/02/22 which she states she has been taking with no side effects.   PERTINENT  PMH / PSH: HTN, AUB  OBJECTIVE:   Vitals:   04/05/22 0832  BP: 102/90  Pulse: 72  SpO2: 100%     General: NAD, pleasant, able to participate in exam Cardiac: RRR, no murmurs. Respiratory: CTAB, normal effort, No wheezes, rales or rhonchi Abdomen: Bowel sounds present, nontender, nondistended, no hepatosplenomegaly. Extremities: no edema or cyanosis. Skin: warm and dry, no rashes noted Neuro: alert, no obvious focal deficits Psych: Normal affect and mood  ASSESSMENT/PLAN:   HTN -Continue amlodipine 5 mg daily   Procedure: Nexplanon Removal:  Patient given informed consent for removal of her Implanon, time out was performed.  Signed copy in the chart.  Appropriate time out taken. Implanon site identified.  Area prepped in usual sterile fashon. One cc of 1% lidocaine was used to anesthetize the area at the distal end of the implant. A small stab incision was made right beside the implant on the distal portion.  The implanon rod was grasped using hemostats and removed without difficulty.  There was less than 3 cc blood loss. There were no complications.  Steri-strips were applied over the small incision.  A pressure bandage was applied to reduce any bruising.  The patient tolerated the procedure well and was given post procedure instructions.   Dr. Erick Alley, DO [facility]    {    This will disappear when note is signed, click to select method of visit    :1}

## 2022-04-05 NOTE — Assessment & Plan Note (Signed)
She is tolerating amlodipine 5 mg well.  Continue amlodipine 5 mg daily

## 2022-04-06 ENCOUNTER — Encounter: Payer: Self-pay | Admitting: Student

## 2022-04-12 ENCOUNTER — Telehealth: Payer: Self-pay | Admitting: Student

## 2022-04-12 NOTE — Telephone Encounter (Signed)
Attempted to call patient using Spanish interpreter to discuss neck steps in care.  Patient did not pick up and did not have a voicemail box set up.

## 2022-04-29 ENCOUNTER — Encounter: Payer: Self-pay | Admitting: Family Medicine

## 2022-04-29 ENCOUNTER — Ambulatory Visit (INDEPENDENT_AMBULATORY_CARE_PROVIDER_SITE_OTHER): Payer: Medicaid Other | Admitting: Family Medicine

## 2022-04-29 DIAGNOSIS — N812 Incomplete uterovaginal prolapse: Secondary | ICD-10-CM

## 2022-04-29 NOTE — Assessment & Plan Note (Signed)
Refer back to Uro/Gyn as they were already planning surgery.

## 2022-04-29 NOTE — Progress Notes (Signed)
   Subjective:    Patient ID: Deborah Chandler is a 41 y.o. female presenting with No chief complaint on file.  on 04/29/2022  HPI: Here today to discuss possible surgery. Previously seen by Uro/GYN and was supposed to let them know which type of surgery she wanted. Last seen here with the midwife and declined surgery at this time. She walked back in here and asked to be scheduled to discuss surgery. Not much has changed. Has had her Nexplanon removed. Normal appearing CT 0/22.  Review of Systems  Constitutional:  Negative for chills and fever.  Respiratory:  Negative for shortness of breath.   Cardiovascular:  Negative for chest pain.  Gastrointestinal:  Negative for abdominal pain, nausea and vomiting.  Genitourinary:  Negative for dysuria.  Skin:  Negative for rash.      Objective:    BP (!) 146/101   Pulse 65   Wt 235 lb 1.6 oz (106.6 kg)   LMP 04/06/2022 (Exact Date)   BMI 44.42 kg/m  Physical Exam Exam conducted with a chaperone present.  Constitutional:      General: She is not in acute distress.    Appearance: She is well-developed.  HENT:     Head: Normocephalic and atraumatic.  Eyes:     General: No scleral icterus. Cardiovascular:     Rate and Rhythm: Normal rate.  Pulmonary:     Effort: Pulmonary effort is normal.  Abdominal:     Palpations: Abdomen is soft.  Musculoskeletal:     Cervical back: Neck supple.  Skin:    General: Skin is warm and dry.  Neurological:     Mental Status: She is alert and oriented to person, place, and time.         Assessment & Plan:   Problem List Items Addressed This Visit       Unprioritized   Prolapse of female pelvic organs    Refer back to Uro/Gyn as they were already planning surgery.        Return if symptoms worsen or fail to improve.  Reva Bores, MD 04/29/2022 11:12 AM

## 2022-05-17 ENCOUNTER — Encounter: Payer: Self-pay | Admitting: Obstetrics and Gynecology

## 2022-05-17 ENCOUNTER — Ambulatory Visit (INDEPENDENT_AMBULATORY_CARE_PROVIDER_SITE_OTHER): Payer: Medicaid Other | Admitting: Obstetrics and Gynecology

## 2022-05-17 VITALS — BP 141/90 | HR 79 | Ht 60.0 in | Wt 235.0 lb

## 2022-05-17 DIAGNOSIS — N8110 Cystocele, unspecified: Secondary | ICD-10-CM

## 2022-05-17 DIAGNOSIS — N816 Rectocele: Secondary | ICD-10-CM

## 2022-05-17 DIAGNOSIS — N812 Incomplete uterovaginal prolapse: Secondary | ICD-10-CM
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
## 2022-05-19 ENCOUNTER — Telehealth (HOSPITAL_BASED_OUTPATIENT_CLINIC_OR_DEPARTMENT_OTHER): Payer: Self-pay | Admitting: *Deleted

## 2022-05-19 NOTE — Telephone Encounter (Signed)
Attempted to call pt with Spanish Interpreter Arlys John # [phone] to schedule surgery. Unable to leave message.

## 2022-05-31 ENCOUNTER — Ambulatory Visit: Payer: Self-pay

## 2022-05-31 ENCOUNTER — Other Ambulatory Visit: Payer: Self-pay | Admitting: Family Medicine

## 2022-05-31 DIAGNOSIS — M542 Cervicalgia: Secondary | ICD-10-CM

## 2022-06-15 ENCOUNTER — Ambulatory Visit (INDEPENDENT_AMBULATORY_CARE_PROVIDER_SITE_OTHER): Payer: Medicaid Other

## 2022-06-15 DIAGNOSIS — Z23 Encounter for immunization: Secondary | ICD-10-CM | POA: Diagnosis not present

## 2022-06-24 ENCOUNTER — Encounter: Payer: Self-pay | Admitting: Obstetrics and Gynecology

## 2022-06-24 ENCOUNTER — Ambulatory Visit (INDEPENDENT_AMBULATORY_CARE_PROVIDER_SITE_OTHER): Payer: Medicaid Other | Admitting: Obstetrics and Gynecology

## 2022-06-24 VITALS — BP 150/95 | HR 84

## 2022-06-24 DIAGNOSIS — N393 Stress incontinence (female) (male): Secondary | ICD-10-CM

## 2022-06-24 DIAGNOSIS — N8110 Cystocele, unspecified: Secondary | ICD-10-CM | POA: Diagnosis not present

## 2022-06-24 DIAGNOSIS — N816 Rectocele: Secondary | ICD-10-CM

## 2022-06-24 DIAGNOSIS — N812 Incomplete uterovaginal prolapse: Secondary | ICD-10-CM | POA: Diagnosis not present
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
## 2022-06-30 NOTE — Telephone Encounter (Signed)
Pts daughter, Kennieth Rad, called asking if her mother's surgery could be done in November. Advised that the surgery had to be moved to December 4 because Dr. Wannetta Sender was unable to have an assist on November 6 and there are no other available dates in November.

## 2022-07-07 ENCOUNTER — Encounter: Payer: Medicaid Other | Admitting: Obstetrics and Gynecology
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
## 2022-07-13 ENCOUNTER — Ambulatory Visit: Payer: Medicaid Other | Admitting: Family Medicine

## 2022-07-13 ENCOUNTER — Encounter: Payer: Self-pay | Admitting: Family Medicine

## 2022-07-13 VITALS — BP 122/86 | HR 75 | Ht 61.0 in | Wt 228.2 lb

## 2022-07-13 DIAGNOSIS — M546 Pain in thoracic spine: Secondary | ICD-10-CM | POA: Diagnosis not present

## 2022-07-13 DIAGNOSIS — W19XXXA Unspecified fall, initial encounter: Secondary | ICD-10-CM | POA: Insufficient documentation

## 2022-07-13 DIAGNOSIS — M25571 Pain in right ankle and joints of right foot: Secondary | ICD-10-CM | POA: Diagnosis not present

## 2022-07-13 DIAGNOSIS — I10 Essential (primary) hypertension: Secondary | ICD-10-CM | POA: Diagnosis not present

## 2022-07-13 MED ORDER — DICLOFENAC SODIUM 1 % EX GEL: 4.0000 g | Freq: Four times a day (QID) | CUTANEOUS | 0 refills | Status: DC

## 2022-07-13 MED ORDER — AMLODIPINE BESYLATE 5 MG PO TABS: 5.0000 mg | ORAL_TABLET | Freq: Every day | ORAL | 3 refills | Status: DC

## 2022-07-13 MED ORDER — IBUPROFEN 600 MG PO TABS: 600.0000 mg | ORAL_TABLET | Freq: Three times a day (TID) | ORAL | 0 refills | Status: DC | PRN

## 2022-07-13 MED ORDER — ACETAMINOPHEN 500 MG PO TABS: 500.0000 mg | ORAL_TABLET | Freq: Four times a day (QID) | ORAL | 0 refills | Status: DC | PRN

## 2022-07-13 NOTE — Assessment & Plan Note (Signed)
Status post fall down approximately 6 stairs 10 days ago.  Patient has been able to mobilize independently since the fall.  She has not tried many conservative treatment options.  Her examination was remarkable for tenderness to her right Achilles however she had great strength in plantarflexion and a negative Thompson test making less suspicious for Achilles tendon rupture. She had no bony tenderness so I do not feel that x-rays would be beneficial at this time.  She did have some midline thoracic tenderness but no other deformities and she had normal ROM and gait. Her abdominal pain was notable for mild tenderness diffusely without R/G.  -Recommend Tylenol, ibuprofen, Voltaren gel, ice/heat -Return in couple weeks if no improvement, could consider imaging at that time

## 2022-07-13 NOTE — Patient Instructions (Addendum)
Fue maravilloso verte hoy.  Por favor traiga TODOS sus medicamentos a cada visita.  Hoy hablamos de:  No creo que necesitemos hacer radiografas hoy. Puedes alternar ibuprofeno y Tylenol cada 6 horas para el dolor. Tome ibuprofeno con la comida. Tambin puedes utilizar el gel [employer] zonas de Social research officer, government. Utilice una almohadilla trmica en la espalda. Puedes ponerte hielo en el tobillo. Si el dolor persiste y no mejora en un par de semanas, regrese.  He repuesto su medicamento para la presin arterial.  Gracias por venir a su visita segn lo programado. ltimamente hemos tenido un gran problema de "ausencias" y esto limita significativamente nuestra capacidad para atender y Clinical biochemist a los pacientes. Como recordatorio amistoso: si no puede asistir a su cita, llame para cancelarla. Tenemos una poltica de no presentacin para aquellos que no cancelan dentro de las 24 horas. Nuestra poltica es que si falta o no cancela una cita dentro de las 24 horas, 3 veces en un perodo de 6 meses, es posible que lo despidan de [place].  [name] por elegir Medicina familiar [place].  Llame al [phone] si tiene alguna pregunta sobre la cita de [place].  Asegrese de programar un seguimiento en la recepcin antes de irse hoy.  Sharion Settler, D.O. PGY-3 Medicina Familiar

## 2022-07-13 NOTE — Progress Notes (Signed)
    SUBJECTIVE:   CHIEF COMPLAINT / HPI:   Akiah Bauch is a 41 y.o. female who presents to the [facility] clinic today to discuss the following concerns:   Recent Fall States that she fell down about 6 stairs at her [employer] 10 days ago. Since then she has had pain to her right ankle, abdominal pain and mid to low back pain. She was able to get up with some assistance. She has been able to walk but feels herself limping. She has not tried anything for the pain.   Hypertension Reports that she ran out of her Amlodipine. She states that the Amlodipine makes her sleepy so she takes it at night.  She is scheduled for hysterectomy and bilateral salpingectomy in December.  PERTINENT  PMH / PSH: GERD, IBS, chronic cystitis   OBJECTIVE:   BP 122/86   Pulse 75   Ht 5\' 1"  (1.549 m)   Wt 228 lb 3.2 oz (103.5 kg)   LMP 07/10/2022   SpO2 97%   BMI 43.12 kg/m    General: NAD, pleasant, able to participate in exam Respiratory: normal effort Extremities: no edema  Abdomen:  Obese, soft, generalized tenderness in all quadrants without R/G Right Ankle: No ttp to posterior medial or lateral malleolus, base of 5th metatarsal, navicular. Normal strength with dorsiflexion and plantar flexion. Tenderness to achilles tendon without edema. Normal Thompson test. Neurovascularly intact. Back: Midline thoracic pain. Normal AROM in flexion, extension, rotation and side bending. No erythema, or deformities. Skin: warm and dry. No bruising Psych: Normal affect and mood  ASSESSMENT/PLAN:   Fall Status post fall down approximately 6 stairs 10 days ago.  Patient has been able to mobilize independently since the fall.  She has not tried many conservative treatment options.  Her examination was remarkable for tenderness to her right Achilles however she had great strength in plantarflexion and a negative Thompson test making less suspicious for Achilles tendon rupture. She had no bony tenderness so I do not  feel that x-rays would be beneficial at this time.  She did have some midline thoracic tenderness but no other deformities and she had normal ROM and gait. Her abdominal pain was notable for mild tenderness diffusely without R/G.  -Recommend Tylenol, ibuprofen, Voltaren gel, ice/heat -Return in couple weeks if no improvement, could consider imaging at that time    Sharion Settler, [place]

## 2022-07-16 ENCOUNTER — Ambulatory Visit (INDEPENDENT_AMBULATORY_CARE_PROVIDER_SITE_OTHER): Payer: Medicaid Other | Admitting: Obstetrics and Gynecology

## 2022-07-16 ENCOUNTER — Encounter: Payer: Self-pay | Admitting: Obstetrics and Gynecology

## 2022-07-16 VITALS — BP 143/95 | HR 60

## 2022-07-16 DIAGNOSIS — N812 Incomplete uterovaginal prolapse: Secondary | ICD-10-CM

## 2022-07-16 MED ORDER — POLYETHYLENE GLYCOL 3350 17 GM/SCOOP PO POWD
17.0000 g | Freq: Every day | ORAL | 0 refills | Status: DC
Start: 2022-07-16 — End: 2022-08-09

## 2022-07-16 MED ORDER — ACETAMINOPHEN 500 MG PO TABS: 500.0000 mg | ORAL_TABLET | Freq: Four times a day (QID) | ORAL | 0 refills | Status: DC | PRN

## 2022-07-16 MED ORDER — OXYCODONE HCL 5 MG PO TABS: 5.0000 mg | ORAL_TABLET | ORAL | 0 refills | Status: DC | PRN

## 2022-07-16 MED ORDER — IBUPROFEN 600 MG PO TABS: 600.0000 mg | ORAL_TABLET | Freq: Four times a day (QID) | ORAL | 0 refills | Status: DC | PRN
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
## 2022-07-27 ENCOUNTER — Encounter (HOSPITAL_BASED_OUTPATIENT_CLINIC_OR_DEPARTMENT_OTHER): Payer: Self-pay | Admitting: Obstetrics and Gynecology

## 2022-08-03 ENCOUNTER — Other Ambulatory Visit: Payer: Self-pay

## 2022-08-03 ENCOUNTER — Encounter (HOSPITAL_BASED_OUTPATIENT_CLINIC_OR_DEPARTMENT_OTHER): Payer: Self-pay | Admitting: Obstetrics and Gynecology

## 2022-08-03 DIAGNOSIS — Z01818 Encounter for other preprocedural examination: Secondary | ICD-10-CM | POA: Diagnosis not present

## 2022-08-03 DIAGNOSIS — N939 Abnormal uterine and vaginal bleeding, unspecified: Secondary | ICD-10-CM | POA: Diagnosis not present

## 2022-08-03 DIAGNOSIS — I498 Other specified cardiac arrhythmias: Secondary | ICD-10-CM | POA: Diagnosis not present

## 2022-08-03 NOTE — Progress Notes (Addendum)
Spoke w/ via phone for pre-op interview---[facility] Lab needs dos---- urine pregnancy              Lab results------08/04/22 lab appt for cbc, bmp, EKG, type & screen COVID test -----patient states mild cough, instructed patient to call Dr. Jari Favre office and let them know about her cough Arrive at -------0530 on Monday, 08/09/22 NPO after MN NO Solid Food.  Clear liquids from MN until---0430 Med rec completed Medications to take morning of surgery -----None Diabetic medication -----n/a Patient instructed no nail polish to be worn day of surgery Patient instructed to bring photo id and insurance card day of surgery Patient aware to have Driver (ride ) / caregiver    for 24 hours after surgery  Patient Special Instructions -----Extended / overnight instructions given. Pre-Op special Istructions -----Female Spanish Interpreter requested for day of surgery. Patient verbalized understanding of instructions that were given at this phone interview. Patient denies shortness of breath, chest pain, fever at this phone interview.

## 2022-08-03 NOTE — Progress Notes (Signed)
Spoke with patient via Spanish Interpreter ID # [id number]. Spanish (female) interpreter requested for day of surgery.

## 2022-08-03 NOTE — Progress Notes (Addendum)
Your procedure is scheduled on Monday, 08/09/2022.  Report to [facility] AT  5:30  A. M.   Call this number if you have problems the morning of surgery  :[phone].   OUR ADDRESS IS [address].  WE ARE LOCATED IN [facility].  PLEASE BRING YOUR INSURANCE CARD AND PHOTO ID DAY OF SURGERY.  ONLY 2 PEOPLE ARE ALLOWED IN  WAITING  ROOM.                                      REMEMBER:  DO NOT EAT FOOD, CANDY GUM OR MINTS  AFTER MIDNIGHT THE NIGHT BEFORE YOUR SURGERY . YOU MAY HAVE CLEAR LIQUIDS FROM MIDNIGHT THE NIGHT BEFORE YOUR SURGERY UNTIL  4:30 AM. NO CLEAR LIQUIDS AFTER   4:30 AM DAY OF SURGERY.  YOU MAY  BRUSH YOUR TEETH MORNING OF SURGERY AND RINSE YOUR MOUTH OUT, NO CHEWING GUM CANDY OR MINTS.     CLEAR LIQUID DIET   Foods Allowed                                                                     Foods Excluded  Coffee and tea, regular and decaf                             liquids that you cannot  Plain Jell-O                                                                   see through such as: Fruit ices (not with fruit pulp)                                     milk, soups, orange juice  Plain  Popsicles                                    All solid food Carbonated beverages, regular and diet                                    Cranberry, grape and apple juices Sports drinks like Gatorade _____________________________________________________________________     TAKE ONLY THESE MEDICATIONS MORNING OF SURGERY: None    UP TO 4 VISITORS  MAY VISIT IN THE EXTENDED RECOVERY ROOM UNTIL 800 PM ONLY.  ONE  VISITOR AGE 68 AND OVER MAY SPEND THE NIGHT AND MUST BE IN EXTENDED RECOVERY ROOM NO LATER THAN 800 PM . YOUR DISCHARGE TIME AFTER YOU SPEND THE NIGHT IS 900 AM THE MORNING AFTER YOUR SURGERY.  YOU MAY PACK A SMALL OVERNIGHT BAG WITH TOILETRIES FOR YOUR OVERNIGHT  STAY IF YOU WISH.  YOUR PRESCRIPTION MEDICATIONS WILL BE PROVIDED  DURING [place].                                      DO NOT WEAR JEWERLY, MAKE UP. DO NOT WEAR LOTIONS, POWDERS, PERFUMES OR NAIL POLISH ON YOUR FINGERNAILS. TOENAIL POLISH IS OK TO WEAR. DO NOT SHAVE FOR 48 HOURS PRIOR TO DAY OF SURGERY. MEN MAY SHAVE FACE AND NECK. CONTACTS, GLASSES, OR DENTURES MAY NOT BE WORN TO SURGERY.  REMEMBER: NO SMOKING, DRUGS OR ALCOHOL FOR 24 HOURS BEFORE YOUR SURGERY.                                    [facility] IS NOT RESPONSIBLE  FOR ANY BELONGINGS.                                                                    Marland Kitchen           [facility] - Preparing for Surgery Before surgery, you can play an important role.  Because skin is not sterile, your skin needs to be as free of germs as possible.  You can reduce the number of germs on your skin by washing with CHG (chlorahexidine gluconate) soap before surgery.  CHG is an antiseptic cleaner which kills germs and bonds with the skin to continue killing germs even after washing. Please DO NOT use if you have an allergy to CHG or antibacterial soaps.  If your skin becomes reddened/irritated stop using the CHG and inform your nurse when you arrive at Short Stay. Do not shave (including legs and underarms) for at least 48 hours prior to the first CHG shower.  You may shave your face/neck. Please follow these instructions carefully:  1.  Shower with CHG Soap the night before surgery and the  morning of Surgery.  2.  If you choose to wash your hair, wash your hair first as usual with your  normal  shampoo.  3.  After you shampoo, rinse your hair and body thoroughly to remove the  shampoo.                                        4.  Use CHG as you would any other liquid soap.  You can apply chg directly  to the skin and wash , chg soap provided, night before and morning of your surgery.  5.  Apply the CHG Soap to your body ONLY FROM THE NECK DOWN.   Do not use on face/ open                           Wound or open  sores. Avoid contact with eyes, ears mouth and genitals (private parts).                       Wash face,  Genitals (private parts) with your normal soap.  6.  Wash thoroughly, paying special attention to the area where your surgery  will be performed.  7.  Thoroughly rinse your body with warm water from the neck down.  8.  DO NOT shower/wash with your normal soap after using and rinsing off  the CHG Soap.             9.  Pat yourself dry with a clean towel.            10.  Wear clean pajamas.            11.  Place clean sheets on your bed the night of your first shower and do not  sleep with pets. Day of Surgery : Do not apply any lotions/deodorants the morning of surgery.  Please wear clean clothes to the hospital/surgery center.  IF YOU HAVE ANY SKIN IRRITATION OR PROBLEMS WITH THE SURGICAL SOAP, PLEASE GET A BAR OF GOLD DIAL SOAP AND SHOWER THE NIGHT BEFORE YOUR SURGERY AND THE MORNING OF YOUR SURGERY. PLEASE LET THE NURSE KNOW MORNING OF YOUR SURGERY IF YOU HAD ANY PROBLEMS WITH THE SURGICAL SOAP.   ________________________________________________________________________                                                        QUESTIONS [facility] PRE OP NURSE PHONE [phone].

## 2022-08-04 ENCOUNTER — Encounter (HOSPITAL_COMMUNITY)
Admission: RE | Admit: 2022-08-04 | Discharge: 2022-08-04 | Disposition: A | Discharge status: Home / Self Care | Payer: Medicaid Other | Source: Ambulatory Visit | Attending: Obstetrics and Gynecology | Admitting: Obstetrics and Gynecology

## 2022-08-04 DIAGNOSIS — N939 Abnormal uterine and vaginal bleeding, unspecified: Secondary | ICD-10-CM | POA: Insufficient documentation

## 2022-08-04 DIAGNOSIS — Z01818 Encounter for other preprocedural examination: Secondary | ICD-10-CM | POA: Insufficient documentation

## 2022-08-04 DIAGNOSIS — I498 Other specified cardiac arrhythmias: Secondary | ICD-10-CM | POA: Insufficient documentation

## 2022-08-04 LAB — BASIC METABOLIC PANEL
Anion gap: 6 (ref 5–15)
BUN: 10 mg/dL (ref 6–20)
CO2: 24 mmol/L (ref 22–32)
Calcium: 8.6 mg/dL — ABNORMAL LOW (ref 8.9–10.3)
Chloride: 108 mmol/L (ref 98–111)
Creatinine, Ser: 0.67 mg/dL (ref 0.44–1.00)
GFR, Estimated: >60 mL/min (ref >60)
Glucose, Bld: 97 mg/dL (ref 70–99)
Potassium: 4.2 mmol/L (ref 3.5–5.1)
Sodium: 138 mmol/L (ref 135–145)

## 2022-08-04 LAB — CBC
HCT: 40.6 % (ref 36.0–46.0)
Hemoglobin: 12.5 g/dL (ref 12.0–15.0)
MCH: 25.7 pg — ABNORMAL LOW (ref 26.0–34.0)
MCHC: 30.8 g/dL (ref 30.0–36.0)
MCV: 83.4 fL (ref 80.0–100.0)
Platelets: 268 10*3/uL (ref 150–400)
RBC: 4.87 MIL/uL (ref 3.87–5.11)
RDW: 14.6 % (ref 11.5–15.5)
WBC: 8.3 10*3/uL (ref 4.0–10.5)
nRBC: 0 % (ref 0.0–0.2)

## 2022-08-05 LAB — TYPE AND SCREEN
ABO/RH(D): A POS
Antibody Screen: NEGATIVE

## 2022-08-09 ENCOUNTER — Encounter (HOSPITAL_BASED_OUTPATIENT_CLINIC_OR_DEPARTMENT_OTHER)
Admission: RE | Disposition: A | Discharge status: Home / Self Care | Payer: Self-pay | Source: Ambulatory Visit | Attending: Obstetrics and Gynecology

## 2022-08-09 ENCOUNTER — Ambulatory Visit (HOSPITAL_BASED_OUTPATIENT_CLINIC_OR_DEPARTMENT_OTHER): Payer: Medicaid Other | Admitting: Anesthesiology

## 2022-08-09 ENCOUNTER — Other Ambulatory Visit: Payer: Self-pay

## 2022-08-09 ENCOUNTER — Ambulatory Visit (HOSPITAL_BASED_OUTPATIENT_CLINIC_OR_DEPARTMENT_OTHER)
Admission: RE | Admit: 2022-08-09 | Discharge: 2022-08-10 | Disposition: A | Discharge status: Home / Self Care | Payer: Medicaid Other | Source: Ambulatory Visit | Attending: Obstetrics and Gynecology | Admitting: Obstetrics and Gynecology

## 2022-08-09 ENCOUNTER — Encounter (HOSPITAL_BASED_OUTPATIENT_CLINIC_OR_DEPARTMENT_OTHER): Payer: Self-pay | Admitting: Obstetrics and Gynecology

## 2022-08-09 DIAGNOSIS — I10 Essential (primary) hypertension: Secondary | ICD-10-CM

## 2022-08-09 DIAGNOSIS — N812 Incomplete uterovaginal prolapse: Secondary | ICD-10-CM | POA: Diagnosis not present

## 2022-08-09 DIAGNOSIS — N816 Rectocele: Secondary | ICD-10-CM | POA: Diagnosis not present

## 2022-08-09 DIAGNOSIS — Z6841 Body Mass Index (BMI) 40.0 and over, adult: Secondary | ICD-10-CM | POA: Diagnosis not present

## 2022-08-09 DIAGNOSIS — N393 Stress incontinence (female) (male): Secondary | ICD-10-CM | POA: Diagnosis not present

## 2022-08-09 DIAGNOSIS — Z01818 Encounter for other preprocedural examination: Secondary | ICD-10-CM

## 2022-08-09 DIAGNOSIS — N8111 Cystocele, midline: Secondary | ICD-10-CM | POA: Diagnosis not present

## 2022-08-09 DIAGNOSIS — N8003 Adenomyosis of the uterus: Secondary | ICD-10-CM | POA: Diagnosis not present

## 2022-08-09 DIAGNOSIS — N939 Abnormal uterine and vaginal bleeding, unspecified: Secondary | ICD-10-CM

## 2022-08-09 DIAGNOSIS — N880 Leukoplakia of cervix uteri: Secondary | ICD-10-CM | POA: Diagnosis not present

## 2022-08-09 HISTORY — PX: CYSTOSCOPY: SHX5120

## 2022-08-09 HISTORY — PX: BLADDER SUSPENSION: SHX72

## 2022-08-09 HISTORY — PX: VAGINAL HYSTERECTOMY: SHX2639

## 2022-08-09 HISTORY — DX: Essential (primary) hypertension: I10

## 2022-08-09 HISTORY — PX: ANTERIOR AND POSTERIOR REPAIR: SHX5121

## 2022-08-09 HISTORY — PX: VAGINAL PROLAPSE REPAIR: SHX830

## 2022-08-09 HISTORY — DX: Uterovaginal prolapse, unspecified: N81.4

## 2022-08-09 LAB — CBC
HCT: 37.6 % (ref 36.0–46.0)
Hemoglobin: 11.4 g/dL — ABNORMAL LOW (ref 12.0–15.0)
MCH: 24.5 pg — ABNORMAL LOW (ref 26.0–34.0)
MCHC: 30.3 g/dL (ref 30.0–36.0)
MCV: 80.9 fL (ref 80.0–100.0)
Platelets: 293 10*3/uL (ref 150–400)
RBC: 4.65 MIL/uL (ref 3.87–5.11)
RDW: 14.1 % (ref 11.5–15.5)
WBC: 13.3 10*3/uL — ABNORMAL HIGH (ref 4.0–10.5)
nRBC: 0 % (ref 0.0–0.2)

## 2022-08-09 LAB — ABO/RH: ABO/RH(D): A POS

## 2022-08-09 LAB — POCT PREGNANCY, URINE: Preg Test, Ur: NEGATIVE

## 2022-08-09 SURGERY — HYSTERECTOMY, VAGINAL
Anesthesia: General | Site: Vagina

## 2022-08-09 MED ORDER — IBUPROFEN 200 MG PO TABS
600.0000 mg | ORAL_TABLET | Freq: Four times a day (QID) | ORAL | Status: DC
  Administered 2022-08-09 – 2022-08-10 (×4): 600 mg via ORAL

## 2022-08-09 MED ORDER — LIDOCAINE HCL (CARDIAC) PF 100 MG/5ML IV SOSY
PREFILLED_SYRINGE | INTRAVENOUS | Status: DC | PRN
  Administered 2022-08-09: 60 mg via INTRAVENOUS

## 2022-08-09 MED ORDER — ROCURONIUM BROMIDE 100 MG/10ML IV SOLN
INTRAVENOUS | Status: DC | PRN
  Administered 2022-08-09: 70 mg via INTRAVENOUS
  Administered 2022-08-09: 5 mg via INTRAVENOUS
  Administered 2022-08-09: 20 mg via INTRAVENOUS
  Administered 2022-08-09: 5 mg via INTRAVENOUS

## 2022-08-09 MED ORDER — POLYETHYLENE GLYCOL 3350 17 GM/SCOOP PO POWD: 17.0000 g | Freq: Every day | ORAL | 0 refills | Status: DC

## 2022-08-09 MED ORDER — FENTANYL CITRATE (PF) 100 MCG/2ML IJ SOLN
INTRAMUSCULAR | Status: AC
  Filled 2022-08-09: qty 2

## 2022-08-09 MED ORDER — PHENAZOPYRIDINE HCL 100 MG PO TABS
ORAL_TABLET | ORAL | Status: AC
  Filled 2022-08-09: qty 2

## 2022-08-09 MED ORDER — PHENAZOPYRIDINE HCL 100 MG PO TABS
200.0000 mg | ORAL_TABLET | ORAL | Status: AC
  Administered 2022-08-09: 200 mg via ORAL

## 2022-08-09 MED ORDER — OXYCODONE HCL 5 MG/5ML PO SOLN: 5.0000 mg | Freq: Once | ORAL | Status: DC | PRN

## 2022-08-09 MED ORDER — OXYCODONE HCL 5 MG PO TABS: 5.0000 mg | ORAL_TABLET | ORAL | 0 refills | Status: DC | PRN

## 2022-08-09 MED ORDER — OXYCODONE HCL 5 MG PO TABS
ORAL_TABLET | ORAL | Status: AC
  Filled 2022-08-09: qty 1

## 2022-08-09 MED ORDER — MORPHINE SULFATE (PF) 2 MG/ML IV SOLN
INTRAVENOUS | Status: AC
  Filled 2022-08-09: qty 1

## 2022-08-09 MED ORDER — CEFAZOLIN SODIUM-DEXTROSE 2-4 GM/100ML-% IV SOLN
2.0000 g | INTRAVENOUS | Status: AC
  Administered 2022-08-09: 2 g via INTRAVENOUS

## 2022-08-09 MED ORDER — SIMETHICONE 80 MG PO CHEW: 80.0000 mg | CHEWABLE_TABLET | Freq: Four times a day (QID) | ORAL | Status: DC | PRN

## 2022-08-09 MED ORDER — GABAPENTIN 300 MG PO CAPS
300.0000 mg | ORAL_CAPSULE | ORAL | Status: AC
  Administered 2022-08-09: 300 mg via ORAL

## 2022-08-09 MED ORDER — GLYCOPYRROLATE PF 0.2 MG/ML IJ SOSY
PREFILLED_SYRINGE | INTRAMUSCULAR | Status: AC
  Filled 2022-08-09: qty 2

## 2022-08-09 MED ORDER — ACETAMINOPHEN 500 MG PO TABS
ORAL_TABLET | ORAL | Status: AC
  Filled 2022-08-09: qty 2

## 2022-08-09 MED ORDER — OXYCODONE HCL 5 MG PO TABS
ORAL_TABLET | ORAL | Status: AC
  Filled 2022-08-09: qty 2

## 2022-08-09 MED ORDER — MORPHINE SULFATE (PF) 4 MG/ML IV SOLN
2.0000 mg | INTRAVENOUS | Status: DC | PRN
  Administered 2022-08-09 – 2022-08-10 (×2): 2 mg via INTRAVENOUS

## 2022-08-09 MED ORDER — ONDANSETRON HCL 4 MG/2ML IJ SOLN: 4.0000 mg | Freq: Four times a day (QID) | INTRAMUSCULAR | Status: DC | PRN

## 2022-08-09 MED ORDER — IBUPROFEN 200 MG PO TABS
ORAL_TABLET | ORAL | Status: AC
  Filled 2022-08-09: qty 3

## 2022-08-09 MED ORDER — SODIUM CHLORIDE 0.9 % IR SOLN
Status: DC | PRN
  Administered 2022-08-09: 1000 mL
  Administered 2022-08-09: 500 mL

## 2022-08-09 MED ORDER — ACETAMINOPHEN 325 MG PO TABS
650.0000 mg | ORAL_TABLET | Freq: Four times a day (QID) | ORAL | Status: DC
  Administered 2022-08-09 – 2022-08-10 (×2): 650 mg via ORAL

## 2022-08-09 MED ORDER — LACTATED RINGERS IV SOLN: INTRAVENOUS | Status: DC

## 2022-08-09 MED ORDER — OXYCODONE HCL 5 MG PO TABS: 5.0000 mg | ORAL_TABLET | Freq: Once | ORAL | Status: DC | PRN

## 2022-08-09 MED ORDER — PROPOFOL 10 MG/ML IV BOLUS
INTRAVENOUS | Status: DC | PRN
  Administered 2022-08-09: 200 mg via INTRAVENOUS
  Administered 2022-08-09 (×3): 50 mg via INTRAVENOUS

## 2022-08-09 MED ORDER — SUGAMMADEX SODIUM 200 MG/2ML IV SOLN
INTRAVENOUS | Status: DC | PRN
  Administered 2022-08-09: 200 mg via INTRAVENOUS

## 2022-08-09 MED ORDER — FENTANYL CITRATE (PF) 100 MCG/2ML IJ SOLN
INTRAMUSCULAR | Status: DC | PRN
  Administered 2022-08-09 (×2): 25 ug via INTRAVENOUS
  Administered 2022-08-09: 50 ug via INTRAVENOUS
  Administered 2022-08-09: 25 ug via INTRAVENOUS
  Administered 2022-08-09: 50 ug via INTRAVENOUS
  Administered 2022-08-09 (×3): 25 ug via INTRAVENOUS

## 2022-08-09 MED ORDER — AMISULPRIDE (ANTIEMETIC) 5 MG/2ML IV SOLN: 10.0000 mg | Freq: Once | INTRAVENOUS | Status: DC | PRN

## 2022-08-09 MED ORDER — ONDANSETRON HCL 4 MG PO TABS: 4.0000 mg | ORAL_TABLET | Freq: Four times a day (QID) | ORAL | Status: DC | PRN

## 2022-08-09 MED ORDER — ACETAMINOPHEN 325 MG PO TABS
ORAL_TABLET | ORAL | Status: AC
  Filled 2022-08-09: qty 2

## 2022-08-09 MED ORDER — OXYCODONE HCL 5 MG PO TABS
5.0000 mg | ORAL_TABLET | ORAL | Status: DC | PRN
  Administered 2022-08-09: 10 mg via ORAL
  Administered 2022-08-09: 5 mg via ORAL
  Administered 2022-08-09 – 2022-08-10 (×2): 10 mg via ORAL

## 2022-08-09 MED ORDER — ACETAMINOPHEN 500 MG PO TABS
1000.0000 mg | ORAL_TABLET | ORAL | Status: AC
  Administered 2022-08-09: 1000 mg via ORAL

## 2022-08-09 MED ORDER — FENTANYL CITRATE (PF) 100 MCG/2ML IJ SOLN
25.0000 ug | INTRAMUSCULAR | Status: DC | PRN
  Administered 2022-08-09 (×3): 25 ug via INTRAVENOUS

## 2022-08-09 MED ORDER — IBUPROFEN 600 MG PO TABS: 600.0000 mg | ORAL_TABLET | Freq: Four times a day (QID) | ORAL | 0 refills | Status: DC | PRN

## 2022-08-09 MED ORDER — ONDANSETRON HCL 4 MG/2ML IJ SOLN
INTRAMUSCULAR | Status: DC | PRN
  Administered 2022-08-09: 4 mg via INTRAVENOUS

## 2022-08-09 MED ORDER — LIDOCAINE-EPINEPHRINE 1 %-1:100000 IJ SOLN
INTRAMUSCULAR | Status: DC | PRN
  Administered 2022-08-09: 35 mL

## 2022-08-09 MED ORDER — GABAPENTIN 300 MG PO CAPS
ORAL_CAPSULE | ORAL | Status: AC
  Filled 2022-08-09: qty 1

## 2022-08-09 MED ORDER — CEFAZOLIN SODIUM-DEXTROSE 2-4 GM/100ML-% IV SOLN
INTRAVENOUS | Status: AC
  Filled 2022-08-09: qty 100

## 2022-08-09 MED ORDER — POVIDONE-IODINE 10 % EX SWAB: 2.0000 | Freq: Once | CUTANEOUS | Status: DC

## 2022-08-09 MED ORDER — ACETAMINOPHEN 500 MG PO TABS: 500.0000 mg | ORAL_TABLET | Freq: Four times a day (QID) | ORAL | 0 refills | Status: DC | PRN

## 2022-08-09 MED ORDER — PROPOFOL 1000 MG/100ML IV EMUL
INTRAVENOUS | Status: AC
  Filled 2022-08-09: qty 100

## 2022-08-09 MED ORDER — AMLODIPINE BESYLATE 5 MG PO TABS
5.0000 mg | ORAL_TABLET | Freq: Every day | ORAL | Status: DC
  Administered 2022-08-09: 5 mg via ORAL
  Filled 2022-08-09: qty 1

## 2022-08-09 MED ORDER — MIDAZOLAM HCL 2 MG/2ML IJ SOLN
INTRAMUSCULAR | Status: DC | PRN
  Administered 2022-08-09: 2 mg via INTRAVENOUS

## 2022-08-09 MED ORDER — DEXAMETHASONE SODIUM PHOSPHATE 4 MG/ML IJ SOLN
INTRAMUSCULAR | Status: DC | PRN
  Administered 2022-08-09: 10 mg via INTRAVENOUS

## 2022-08-09 MED ORDER — HEMOSTATIC AGENTS (NO CHARGE) OPTIME
TOPICAL | Status: DC | PRN
  Administered 2022-08-09: 1

## 2022-08-09 MED ORDER — MIDAZOLAM HCL 2 MG/2ML IJ SOLN
INTRAMUSCULAR | Status: AC
  Filled 2022-08-09: qty 2

## 2022-08-09 MED ORDER — POLYETHYLENE GLYCOL 3350 17 G PO PACK: 17.0000 g | PACK | Freq: Every day | ORAL | Status: DC

## 2022-08-09 SURGICAL SUPPLY — 60 items
ADH SKN CLS APL DERMABOND .7 (GAUZE/BANDAGES/DRESSINGS) ×2
AGENT HMST KT MTR STRL THRMB (HEMOSTASIS) ×2
APL ESCP 34 STRL LF DISP (HEMOSTASIS)
APL PRP STRL LF DISP 70% ISPRP (MISCELLANEOUS) ×2
APPLICATOR SURGIFLO ENDO (HEMOSTASIS) IMPLANT
BLADE CLIPPER SENSICLIP SURGIC (BLADE) ×2 IMPLANT
BLADE SURG 15 STRL LF DISP TIS (BLADE) ×2 IMPLANT
BLADE SURG 15 STRL SS (BLADE) ×2
CHLORAPREP W/TINT 26 (MISCELLANEOUS) IMPLANT
COVER MAYO STAND STRL (DRAPES) ×2 IMPLANT
COVER SURGICAL LIGHT HANDLE (MISCELLANEOUS) IMPLANT
DERMABOND ADVANCED .7 DNX12 (GAUZE/BANDAGES/DRESSINGS) ×2 IMPLANT
DEVICE CAPIO SLIM SINGLE (INSTRUMENTS) IMPLANT
ELECT REM PT RETURN 9FT ADLT (ELECTROSURGICAL) ×2
ELECTRODE REM PT RTRN 9FT ADLT (ELECTROSURGICAL) IMPLANT
GAUZE 4X4 16PLY ~~LOC~~+RFID DBL (SPONGE) ×4 IMPLANT
GLOVE BIOGEL PI IND STRL 6.5 (GLOVE) ×2 IMPLANT
GLOVE BIOGEL PI IND STRL 7.0 (GLOVE) ×2 IMPLANT
GLOVE BIOGEL PI IND STRL 7.5 (GLOVE) IMPLANT
GLOVE SURG SS PI 7.5 STRL IVOR (GLOVE) IMPLANT
GOWN STRL REUS W/TWL LRG LVL3 (GOWN DISPOSABLE) ×2 IMPLANT
HIBICLENS CHG 4% 4OZ BTL (MISCELLANEOUS) ×2 IMPLANT
HOLDER FOLEY CATH W/STRAP (MISCELLANEOUS) ×2 IMPLANT
IV NS 1000ML (IV SOLUTION) ×2
IV NS 1000ML BAXH (IV SOLUTION) IMPLANT
KIT TURNOVER CYSTO (KITS) ×2 IMPLANT
LIGASURE IMPACT 36 18CM CVD LR (INSTRUMENTS) IMPLANT
MANIFOLD NEPTUNE II (INSTRUMENTS) ×2 IMPLANT
NDL MAYO 6 CRC TAPER PT (NEEDLE) ×2 IMPLANT
NEEDLE HYPO 22GX1.5 SAFETY (NEEDLE) ×2 IMPLANT
NEEDLE MAYO 6 CRC TAPER PT (NEEDLE) ×2 IMPLANT
NS IRRIG 500ML POUR BTL (IV SOLUTION) IMPLANT
PACK CYSTO (CUSTOM PROCEDURE TRAY) ×2 IMPLANT
PACK VAGINAL WOMENS (CUSTOM PROCEDURE TRAY) ×2 IMPLANT
PAD OB MATERNITY 4.3X12.25 (PERSONAL CARE ITEMS) ×2 IMPLANT
RETRACTOR LONE STAR DISPOSABLE (INSTRUMENTS) ×2 IMPLANT
RETRACTOR STAY HOOK 5MM (MISCELLANEOUS) ×8 IMPLANT
SET IRRIG Y TYPE TUR BLADDER L (SET/KITS/TRAYS/PACK) ×2 IMPLANT
SPIKE FLUID TRANSFER (MISCELLANEOUS) ×2 IMPLANT
SPONGE T-LAP 18X36 ~~LOC~~+RFID STR (SPONGE) ×2 IMPLANT
SUCTION FRAZIER HANDLE 10FR (MISCELLANEOUS) ×2
SUCTION TUBE FRAZIER 10FR DISP (MISCELLANEOUS) ×2 IMPLANT
SURGIFLO W/THROMBIN 8M KIT (HEMOSTASIS) IMPLANT
SUT ABS MONO DBL WITH NDL 48IN (SUTURE) IMPLANT
SUT MON AB 2-0 SH 27 (SUTURE) IMPLANT
SUT PDS PLUS 0 (SUTURE) ×8
SUT PDS PLUS AB 0 CT-2 (SUTURE) ×8 IMPLANT
SUT VIC AB 0 CT1 27 (SUTURE) ×10
SUT VIC AB 0 CT1 27XBRD ANBCTR (SUTURE) ×2 IMPLANT
SUT VIC AB 0 CT1 27XBRD ANTBC (SUTURE) IMPLANT
SUT VIC AB 0 CT1 27XCR 8 STRN (SUTURE) ×4 IMPLANT
SUT VIC AB 2-0 SH 27 (SUTURE) ×2
SUT VIC AB 2-0 SH 27XBRD (SUTURE) ×2 IMPLANT
SUT VICRYL 2-0 SH 8X27 (SUTURE) ×2 IMPLANT
SYR 10ML LL (SYRINGE) ×2 IMPLANT
SYR BULB EAR ULCER 3OZ GRN STR (SYRINGE) ×2 IMPLANT
SYS SLING ADV FIT BLUE TRNSVAG (Sling) IMPLANT
TOWEL OR 17X26 10 PK STRL BLUE (TOWEL DISPOSABLE) ×2 IMPLANT
TRAY FOLEY W/BAG SLVR 14FR LF (SET/KITS/TRAYS/PACK) ×2 IMPLANT
UNDERPAD 30X36 HEAVY ABSORB (UNDERPADS AND DIAPERS) ×2 IMPLANT

## 2022-08-09 NOTE — Interval H&P Note (Signed)
History and Physical Interval Note:  08/09/2022 7:15 AM  Deborah Chandler  has presented today for surgery, with the diagnosis of anterior vaginal prolapse; uterovaginal prolapse incomplete; prolapse of posterior vagina.  The various methods of treatment have been discussed with the patient and family. After consideration of risks, benefits and other options for treatment, the patient has consented to  Procedure(s) with comments: HYSTERECTOMY VAGINAL WITH BILATERAL SALPINGECTOMY (N/A)  VAGINAL VAULT SUSPENSION (N/A) ANTERIOR (CYSTOCELE) AND POSTERIOR REPAIR (RECTOCELE) (N/A) TRANSVAGINAL TAPE (TVT) PROCEDURE (POSSIBLE) (N/A) CYSTOSCOPY (N/A) as a surgical intervention.  The patient's history has been reviewed, patient examined, no change in status, stable for surgery.  I have reviewed the patient's chart and labs.  Questions were answered to the patient's satisfaction.     Marguerita Beards

## 2022-08-09 NOTE — Op Note (Signed)
Operative Note  Preoperative Diagnosis: anterior vaginal prolapse, posterior vaginal prolapse, uterovaginal prolapse, incomplete, and stress urinary incontinence  Postoperative Diagnosis: same  Procedures performed:  Total vaginal hysterectomy, right salpingectomy, uterosacral ligament suspension, posterior repair and perineorrhaphy, midurethral sling, cystoscopy  Implants:  Implant Name Type Inv. Item Serial No. Manufacturer Lot No. LRB No. Used Action  SYS SLING ADV FIT BLUE TRNSVAG - [id number] Sling SYS SLING ADV FIT BLUE TRNSVAG [id number] Norvel Richards [id number] Bilateral 1 Implanted    Attending Surgeon: Lanetta Inch, MD  Anesthesia: General endotracheal  Findings: 1. On vaginal exam, stage II prolapse noted with good reduction at the end of the case.   2. Normal appearing uterus and cervix, right fallopian tube with simple appearing paratubal cysts, normal ovaries bilaterally, left fallopian tube not visualized.    3. On cystoscopy, normal bladder and urethra without injury, lesion or foreign body. Brisk bilateral ureteral efflux noted.   Specimens:  ID Type Source Tests Collected by Time Destination  1 : cervix, uterus, and right fallopian tubes Tissue PATH Gyn benign resection SURGICAL PATHOLOGY Marguerita Beards, MD 08/09/2022 [phone]     Estimated blood loss: 450 mL  IV fluids: 1700 mL  Urine output: 200 mL  Complications: none  Procedure in Detail:  The patient was taken to the operating room where she was placed under anesthesia.  She was then placed in the dorsal lithotomy position with Allen stirrups and prepped and draped in the usual sterile fashion.  Care was taken to avoid hyperflexion or hyperextension of her lower extremities.    A self-retaining retractor was placed, and a foley catheter was placed. The cervix was grasped with two single-tooth tenacula.  The cervix was injected circumferentially with 1% lidocaine with epinephrine. A 10 blade  was used to incise circumferentially around the cervix.  The posterior vagina was grasped with a Kocher clamp. The Mayo scissors were used to enter the posterior cul-de-sac. Palpation confirmed peritoneal entry and no adhesions. The posterior peritoneum was affixed to the vaginal cuff with 0-Vicryl (this suture was used throughout unless otherwise specified) at the midline. A right angle retractor was placed through the posterior colpotomy. A Heaney clamp was then used to clamp the uterosacral ligaments on each side. These were cut and suture ligated using 0-Vicryl in a Heaney fashion, and tagged with hemostats. Anteriorly, the bladder was dissected off the pubocervical fascia using Metzenbaum scissors. A Deaver retractor was placed anteriorly to protect the bladder. The anterior peritoneal reflection was then identified, tented up and incised with Metzenbaum scissors to create an anterior colpotomy. Palpation confirmed peritoneal entry and no adhesions. The Deaver was placed anteriorly to protect the bladder. The cardinal ligaments were cut and ligated with a ligasure. The uterine arteries were also ligated and cut with the ligasure. The cornua were clamped, cut, free-tied and suture ligated. The uterus and cervix were handed off the field.  Inspection of the pedicles revealed excellent hemostasis.  The right fallopian tube was grapsed with a Babcock clamp. It was noted to also have a paratubal cyst. The mesosalpinx was cauterized and incised with the ligasure. The fimbriated end was not noted. The left fallopian tube was not identifiable. The ovaries bilaterally appeared normal.  For the uterosacral ligament suspension (USLS), the bowel was packed away with a moistened lap pad. The posterior cuff edge was grasped with an Allis clamp.  The right and left uterosacral ligaments were identified visually and digitally. Two stitches of 0 PDS was placed through each  uterosacral ligament towards its insertion site at  the sacrum. These were tagged.. The packing was removed.  A 70-degree cystoscope was introduced, and 360-degree inspection revealed no trauma in the bladder, with bilateral ureteral efflux with tension on the uterosacral sutures.  The bladder was drained and the cystoscope was removed.  The Foley catheter was reinserted.    The anterior vaginal wall was short and appeared well supported with apical suspension, therefore an anterior repair was not performed.  The uterosacral stitches were then attached to the posterior and anterior edges of the vaginal cuff on the ipsilateral sides, in a through and through fashion, using a free needle. Figure of eight sutures of 0-Vicryl were placed through the vaginal cuff and tied down. The lateral uterosacral stitches were then tied down with good apical support noted. The Foley catheter was removed. A 70-degree cystoscope was introduced, and 360-degree inspection revealed no trauma in the bladder, with bilateral ureteral efflux. The cystoscope was removed. The medial uterosacral sutures were then tied down. Cystoscopy was repeated and brisk bilateral ureteral efflux was noted. The bladder was drained and the cystoscope was removed.  The Foley catheter was reinserted.  The mid urethral area was located on the anterior vaginal wall.  Two Allis clamps were placed at the level of the midurethra. 1% lidocaine with epinephrine was injected into the vaginal mucosa. A vertical incision was made between the two clamps using a 15-blade scalpel.  Using sharp dissection, Metzenbaum scissors were used to make a periurethral tunnel from the vaginal incision towards the pubic rami bilaterally for the future sling tracts. The bladder was ensured to be empty. The trocar and attached sling were introduced into the right side of the periurethral vaginal incision, just inferior to the pubic symphysis on the right side. The trocar was guided through the endopelvic fascia and directly vertically.   While hugging the cephalad surface of the pubic bone, the trocar was guided out through the abdomen 2 fingerbreadths lateral to midline at the level of the pubic symphysis on the ipsilateral side. The trocar was placed on the left side in a similar fashion.  A 70-degree cystoscope was introduced, and 360-degree inspection revealed no trauma or trocars in the bladder, with brisk bilateral ureteral efflux.  The bladder was drained and the cystoscope was removed.  The Foley catheter was reinserted.  The sling was brought to lie beneath the mid-urethra.  A needle driver was placed behind the sling to ensure no tension.   The plastic sheath was removed from the sling and the distal ends of the sling were trimmed just below the level of the skin incisions.  Tension-free positioning of the sling was confirmed. Vaginal inspection revealed no vaginotomy or sling perforations of the mucosa.  Surgiflo was used to obtain hemostasis. The vaginal mucosal edges were reapproximated using 2-0 Vicryl.  The vagina was copiously irrigated.  Hemostasis was again noted.  The suprapubic sling incisions were closed with Dermabond.  Attention was then turned to the posterior vagina.  Two Allis clamps were in the midline of the posterior vaginal wall defect and two allis clamps were placed at the introitus.  1% lidocaine with epinephrine was injected into the vaginal mucosa. A vertical incision was made on the posterior vaginal wall with a 15 blade scalpel and a diamond shaped area of epithelium was cut at the introitus. Perineal skin was removed. The rectovaginal septum was then dissected off the vaginal mucosa bilaterally. The rectovaginal septum was then plicated with  vertical mattress sutures of 2-0 Vicryl.  After placement of the first plication stitch two fingers were inserted into the vaginal to confirm adequate caliber.  The last distal stitch incorporated the perineal body in a U stitch fashion.  The bulbocavernosus muscles were  reapproximated using three interrupted stitches of 0-vicrl Vicryl and the hymenal ring was redeveloped. Surgiflo was placed on the surgical bed to assist with hemostasis. After plication, the excess vaginal mucosa was trimmed and the vaginal mucosa was reapproximated using 2-0 Vicryl sutures.  The vagina was copiously irrigated.  Hemostasis was noted. A rectal examination was normal and confirmed no sutures within the rectum.  The patient tolerated the procedure well.  She was awakened from anesthesia and transferred to the recovery room in stable condition. All counts were correct x 2.    [name], MD

## 2022-08-09 NOTE — Anesthesia Procedure Notes (Signed)
Procedure Name: Intubation Date/Time: 08/09/2022 7:50 AM  Performed by: Georgeanne Nim, CRNAPre-anesthesia Checklist: Patient identified, Emergency Drugs available, Suction available, Patient being monitored and Timeout performed Patient Re-evaluated:Patient Re-evaluated prior to induction Oxygen Delivery Method: Circle system utilized Preoxygenation: Pre-oxygenation with 100% oxygen Induction Type: IV induction Ventilation: Mask ventilation without difficulty Laryngoscope Size: Mac and 4 Grade View: Grade I Tube type: Oral Tube size: 7.0 mm Number of attempts: 1 Airway Equipment and Method: Stylet Placement Confirmation: ETT inserted through vocal cords under direct vision, positive ETCO2, CO2 detector and breath sounds checked- equal and bilateral Secured at: 22 cm Dental Injury: Teeth and Oropharynx as per pre-operative assessment

## 2022-08-09 NOTE — Anesthesia Preprocedure Evaluation (Signed)
Anesthesia Evaluation  Patient identified by MRN, date of birth, ID band Patient awake    Reviewed: Allergy & Precautions, NPO status , Patient's Chart, lab work & pertinent test results  Airway Mallampati: III       Dental  (+) Dental Advisory Given   Pulmonary neg pulmonary ROS   breath sounds clear to auscultation       Cardiovascular hypertension, Pt. on medications  Rhythm:Regular Rate:Normal     Neuro/Psych negative neurological ROS     GI/Hepatic Neg liver ROS,GERD  ,,  Endo/Other    Morbid obesity  Renal/GU negative Renal ROS     Musculoskeletal   Abdominal   Peds  Hematology negative hematology ROS (+)   Anesthesia Other Findings   Reproductive/Obstetrics                              Lab Results  Component Value Date   WBC 8.3 08/04/2022   HGB 12.5 08/04/2022   HCT 40.6 08/04/2022   MCV 83.4 08/04/2022   PLT 268 08/04/2022   Lab Results  Component Value Date   CREATININE 0.67 08/04/2022   BUN 10 08/04/2022   NA 138 08/04/2022   K 4.2 08/04/2022   CL 108 08/04/2022   CO2 24 08/04/2022    Anesthesia Physical Anesthesia Plan  ASA: 3  Anesthesia Plan: General   Post-op Pain Management: Toradol IV (intra-op)*, Ketamine IV*, Tylenol PO (pre-op)* and Gabapentin PO (pre-op)*   Induction: Intravenous  PONV Risk Score and Plan: 4 or greater and Scopolamine patch - Pre-op, Midazolam, Dexamethasone, Ondansetron and Treatment may vary due to age or medical condition  Airway Management Planned: Oral ETT  Additional Equipment: None  Intra-op Plan:   Post-operative Plan: Extubation in OR  Informed Consent: I have reviewed the patients History and Physical, chart, labs and discussed the procedure including the risks, benefits and alternatives for the proposed anesthesia with the patient or authorized representative who has indicated his/her understanding and acceptance.      Dental advisory given  Plan Discussed with:   Anesthesia Plan Comments:          Anesthesia Quick Evaluation

## 2022-08-09 NOTE — Progress Notes (Signed)
Spoke with Dr. Florian Buff via phone regarding hesitancy of patient to ambulate and C/O continued discomfort with no relief. Explained that when left alone she appears to be sleeping soundly. States she is not getting any relief at anytime.Discussed reluctance to send her home due to the poor communication among son, daughter, and patient.  The explanations and references to discharge home do not seem to be clear to either party.  Decision made to have her stay overnight.

## 2022-08-09 NOTE — Progress Notes (Signed)
Patient stating her pain is a 9/10. She had been getting oxycodone every 4 hours and it wasn't working well. Dr. Truitt Merle and new orders given for IV morphine. Medicine given to patient. Will continue to monitor.

## 2022-08-09 NOTE — Progress Notes (Signed)
RN used Nurse, learning disability, Mikle Bosworth, on the translator IPAD to talk to patient about her plan of care for the whole night, talk with her about her pain and that the MD was notified and new pain medicine was ordered. Also, made sure to ask patient if she had questions or concerns.

## 2022-08-09 NOTE — Discharge Instructions (Signed)
POST OPERATIVE INSTRUCTIONS  General Instructions Recovery (not bed rest) will last approximately 6 weeks Walking is encouraged, but refrain from strenuous exercise/ housework/ heavy lifting. No lifting >10lbs  Nothing in the vagina- NO intercourse, tampons or douching No intercourse for 12 weeks if you had a hysterectomy Bathing:  Do not submerge in water (NO swimming, bath, hot tub, etc) until after your postop visit. You can shower starting the day after surgery.  No driving until you are not taking narcotic pain medicine and until your pain is well enough controlled that you can slam on the breaks or make sudden movements if needed.   Taking your medications Please take your acetaminophen and ibuprofen on a schedule for the first 48 hours. Take 600mg ibuprofen, then take 500mg acetaminophen 3 hours later, then continue to alternate ibuprofen and acetaminophen. That way you are taking each type of medication every 6 hours. Take the prescribed narcotic (oxycodone, tramadol, etc) as needed, with a maximum being every 4 hours.  Take a stool softener daily to keep your stools soft and preventing you from straining. If you have diarrhea, you decrease your stool softener. This is explained more below. We have prescribed you Miralax.  Reasons to Call the Nurse (see last page for phone numbers) Heavy Bleeding (changing your pad every 1-2 hours) Persistent nausea/vomiting Fever (100.4 degrees or more) Incision problems (pus or other fluid coming out, redness, warmth, increased pain)  Things to Expect After Surgery Mild to Moderate pain is normal during the first day or two after surgery. If prescribed, take Ibuprofen or Tylenol first and use the stronger medicine for "break-through" pain. You can overlap these medicines because they work differently.   Constipation   To Prevent Constipation:  Eat a well-balanced diet including protein, grains, fresh fruit and vegetables.  Drink plenty of fluids.  Walk regularly.  Depending on specific instructions from your physician: take Miralax daily and additionally you can add a stool softener (colace/ docusate) and fiber supplement. Continue as long as you're on pain medications.   To Treat Constipation:  If you do not have a bowel movement in 2 days after surgery, you can take 2 Tbs of Milk of Magnesia 1-2 times a day until you have a bowel movement. If diarrhea occurs, decrease the amount or stop the laxative. If no results with Milk of Magnesia, you can drink a bottle of magnesium citrate which you can purchase over the counter.  Fatigue:  This is a normal response to surgery and will improve with time.  Plan frequent rest periods throughout the day.  Gas Pain:  This is very common but can also be very painful! Drink warm liquids such as herbal teas, bouillon or soup. Walking will help you pass more gas.  Mylicon or Gas-X can be taken over the counter.  Leaking Urine:  Varying amounts of leakage may occur after surgery.  This should improve with time. Your bladder needs at least 3 months to recover from surgery. If you leak after surgery, be sure to mention this to your doctor at your post-op visit. If you were taking medications for overactive bladder prior to surgery, be sure to restart the medications immediately after surgery.  Incisions: If you have incisions on your abdomen, the skin glue will dissolve on its own over time. It is ok to gently rinse with soap and water over these incisions but do not scrub.  Catheter Approximately 50% of patients are unable to urinate after surgery and need to   go home with a catheter. This allows your bladder to rest so it can return to full function. If you go home with a catheter, the office will call to set up a voiding trial a few days after surgery. For most patients, by this visit, they are able to urinate on their own. Long term catheter use is rare.   Return to Work  As work demands and recovery times  vary widely, it is hard to predict when you will want to return to work. If you have a desk job with no strenuous physical activity, and if you would like to return sooner than generally recommended, discuss this with your provider or call our office.   Post op concerns  For non-emergent issues, please call the Urogynecology Nurse. Please leave a message and someone will contact you within one business day.  You can also send a message through MyChart.   AFTER HOURS (After 5:00 PM and on weekends):  For urgent matters that cannot wait until the next business day. Call our office [phone] and connect to the doctor on call.  Please reserve this for important issues.   **FOR ANY TRUE EMERGENCY ISSUES CALL 911 OR GO TO THE NEAREST EMERGENCY ROOM.** Please inform our office or the doctor on call of any emergency.     APPOINTMENTS: Call [phone]    

## 2022-08-09 NOTE — Anesthesia Postprocedure Evaluation (Signed)
Anesthesia Post Note  Patient: Deborah Chandler  Procedure(s) Performed: HYSTERECTOMY VAGINAL WITH RIGHT SALPINGECTOMY (Vagina ) VAGINAL VAULT SUSPENSION (Vagina ) POSTERIOR REPAIR (RECTOCELE) (Vagina ) TRANSVAGINAL TAPE (TVT) PROCEDURE (Vagina ) CYSTOSCOPY (Urethra)     Patient location during evaluation: PACU Anesthesia Type: General Level of consciousness: awake and alert Pain management: pain level controlled Vital Signs Assessment: post-procedure vital signs reviewed and stable Respiratory status: spontaneous breathing, nonlabored ventilation, respiratory function stable and patient connected to nasal cannula oxygen Cardiovascular status: blood pressure returned to baseline and stable Postop Assessment: no apparent nausea or vomiting Anesthetic complications: no  No notable events documented.  Last Vitals:  Vitals:   08/09/22 1230 08/09/22 1242  BP: (!) 143/105 (!) 130/108  Pulse: 86 100  Resp: 14 20  Temp: 36.6 C 36.7 C  SpO2: 96% 98%    Last Pain:  Vitals:   08/09/22 1242  TempSrc:   PainSc: 8                  Kennieth Rad

## 2022-08-09 NOTE — Transfer of Care (Signed)
Immediate Anesthesia Transfer of Care Note  Patient: Deborah Chandler  Procedure(s) Performed: HYSTERECTOMY VAGINAL WITH RIGHT SALPINGECTOMY (Vagina ) VAGINAL VAULT SUSPENSION (Vagina ) POSTERIOR REPAIR (RECTOCELE) (Vagina ) TRANSVAGINAL TAPE (TVT) PROCEDURE (Vagina ) CYSTOSCOPY (Urethra)  Patient Location: PACU  Anesthesia Type:General  Level of Consciousness: drowsy and patient cooperative  Airway & Oxygen Therapy: Patient Spontanous Breathing and Patient connected to nasal cannula oxygen  Post-op Assessment: Report given to RN and Post -op Vital signs reviewed and stable  Post vital signs: Reviewed and stable  Last Vitals:  Vitals Value Taken Time  BP 149/99 08/09/22 1120  Temp    Pulse 98 08/09/22 1124  Resp 16 08/09/22 1124  SpO2 98 % 08/09/22 1124  Vitals shown include unvalidated device data.  Last Pain:  Vitals:   08/09/22 0621  TempSrc: Oral  PainSc: 8       Patients Stated Pain Goal: 3 (08/09/22 3875)  Complications: No notable events documented.

## 2022-08-10 ENCOUNTER — Encounter (HOSPITAL_BASED_OUTPATIENT_CLINIC_OR_DEPARTMENT_OTHER): Payer: Self-pay | Admitting: Obstetrics and Gynecology

## 2022-08-10 DIAGNOSIS — N812 Incomplete uterovaginal prolapse: Secondary | ICD-10-CM | POA: Diagnosis not present

## 2022-08-10 LAB — SURGICAL PATHOLOGY

## 2022-08-10 MED ORDER — IBUPROFEN 200 MG PO TABS
ORAL_TABLET | ORAL | Status: AC
  Filled 2022-08-10: qty 3

## 2022-08-10 MED ORDER — MORPHINE SULFATE (PF) 4 MG/ML IV SOLN
INTRAVENOUS | Status: AC
  Filled 2022-08-10: qty 1

## 2022-08-10 MED ORDER — OXYCODONE HCL 5 MG PO TABS
ORAL_TABLET | ORAL | Status: AC
  Filled 2022-08-10: qty 2

## 2022-08-10 MED ORDER — ACETAMINOPHEN 325 MG PO TABS
ORAL_TABLET | ORAL | Status: AC
  Filled 2022-08-10: qty 2

## 2022-08-10 NOTE — Discharge Summary (Signed)
Physician Discharge Summary   Patient ID: Deborah Chandler [id number] 40 y.o. 10/04/1980  Admit date: 08/09/2022  Discharge date and time: No discharge date for patient encounter.   Admitting Physician: Marguerita Beards, MD   Discharge Physician: Marguerita Beards   Admission Diagnoses: Uterovaginal prolapse, incomplete [N81.2]  Discharge Diagnoses: same  Admission Condition: good  Discharged Condition: good  Indication for Admission: surgery  Hospital Course: 41yo F presented for surgery: Total vaginal hysterectomy, right salpingectomy, uterosacral ligament suspension, posterior repair and perineorrhaphy, midurethral sling, cystoscopy  on 08/09/22. Post-operatively, pain was controlled, she tolerated regular diet and was ambulating. She passed her voiding trial. She was deemed stable for discharge on POD #1.   Consults: None  Significant Diagnostic Studies: none  Treatments: surgery: see above  Discharge Exam: BP (!) 100/55 (BP Location: Left Arm)   Pulse 78   Temp 97.9 F (36.6 C)   Resp 18   Ht 5\' 1"  (1.549 m)   Wt 104.1 kg   LMP 08/07/2022 (Exact Date)   SpO2 96%   BMI 43.38 kg/m   General Appearance:    Alert, cooperative, no distress, appears stated age  Head:    Normocephalic, without obvious abnormality, atraumatic  Lungs:     Clear to auscultation bilaterally, respirations unlabored   Heart:    Regular rate and rhythm, S1 and S2 normal, no murmur, rub   or gallop  Abdomen:     Soft, non-tender, bowel sounds active all four quadrants,    no masses, no organomegaly  Extremities:   Extremities normal, atraumatic, no cyanosis or edema    Disposition: Discharge disposition: 01-Home or Self Care       Patient Instructions:   Activity: activity as tolerated Diet: regular diet Wound Care: none needed  Signed: 14/10/2021 08/10/2022 9:00 AM

## 2022-08-18 ENCOUNTER — Encounter: Payer: Medicaid Other | Admitting: Obstetrics and Gynecology

## 2022-08-19 ENCOUNTER — Telehealth: Payer: Self-pay

## 2022-08-19 NOTE — Telephone Encounter (Signed)
Daughter of Deborah Chandler is a 41 y.o. female called in because the pt is having new coughing and its causing pain when she coughs. Pt said she has an odor in the vagina with a little discharge. Pt complains of leaking more since the surgery.  Pt was notified she should f/u with her pcp for the cough. I explained that leaking is to be expected after the surgery and it would be addressed during her next post op visit. I told the patient I would send a message to Dr. Florian Buff regarding the patients concerns

## 2022-08-19 NOTE — Telephone Encounter (Signed)
Pt's daughter was notified I she will let us know if the pt has any retention

## 2022-08-19 NOTE — Telephone Encounter (Signed)
Yes agree that vaginal discharge and leakage can be normal. If she is having any difficulty emptying her bladder then she should let us know.
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
## 2022-08-25 ENCOUNTER — Encounter: Payer: Self-pay | Admitting: Obstetrics and Gynecology

## 2022-08-25 ENCOUNTER — Ambulatory Visit (INDEPENDENT_AMBULATORY_CARE_PROVIDER_SITE_OTHER): Payer: Medicaid Other | Admitting: Obstetrics and Gynecology

## 2022-08-25 VITALS — BP 130/95 | HR 92

## 2022-08-25 DIAGNOSIS — R059 Cough, unspecified: Secondary | ICD-10-CM

## 2022-08-25 DIAGNOSIS — Z9889 Other specified postprocedural states: Secondary | ICD-10-CM

## 2022-08-25 MED ORDER — BENZONATATE 100 MG PO CAPS: 100.0000 mg | ORAL_CAPSULE | Freq: Two times a day (BID) | ORAL | 0 refills | Status: DC | PRN

## 2022-08-25 NOTE — Patient Instructions (Signed)
You will have increased vaginal discharge during the healing period. You may also have some leaking as the sling heals into place, this is normal. Take the cough medication for the next few days to help with your cough.   The market downstairs is open!

## 2022-09-21 ENCOUNTER — Encounter: Payer: Self-pay | Admitting: Obstetrics and Gynecology

## 2022-09-21 ENCOUNTER — Ambulatory Visit (INDEPENDENT_AMBULATORY_CARE_PROVIDER_SITE_OTHER): Payer: Medicaid Other | Admitting: Obstetrics and Gynecology

## 2022-09-21 VITALS — BP 130/87 | HR 83 | Wt 230.0 lb

## 2022-09-21 DIAGNOSIS — N3941 Urge incontinence: Secondary | ICD-10-CM

## 2022-09-21 DIAGNOSIS — Z9889 Other specified postprocedural states: Secondary | ICD-10-CM

## 2022-09-21 MED ORDER — SOLIFENACIN SUCCINATE 5 MG PO TABS: 5.0000 mg | ORAL_TABLET | Freq: Every day | ORAL | 5 refills | Status: DC

## 2022-09-21 NOTE — Progress Notes (Signed)
[facility]  Date of Visit: 09/21/2022  History of Present Illness: [name] is a 42 y.o. female scheduled today for a post-operative visit.   Surgery: s/p Total vaginal hysterectomy, right salpingectomy, uterosacral ligament suspension, posterior repair and perineorrhaphy, midurethral sling, cystoscopy on 08/09/22  She passed her postoperative void trial.   Postoperative course has been uncomplicated.   Translator present today Today she reports she is feeling well. Still has a smell in the vagina. She is asking about using soap internally in her vagina to clean.   Drinks: chocolate in AM, water, rarely one soda. Drinks up until bedtime.   UTI in the last 6 weeks? No  Pain? No  She has returned to her normal activity (except for postop restrictions) Vaginal bulge? No  Stress incontinence: No  Urgency/frequency: No  Urge incontinence: Yes - sometimes at night will have leakage out of nowhere Voiding dysfunction: No  Bowel issues: No   Subjective Success: Do you usually have a bulge or something falling out that you can see or feel in the vaginal area? No  Retreatment Success: Any retreatment with surgery or pessary for any compartment? No   Pathology results: UTERUS, CERVIX, RIGHT FALLOPIAN TUBE, HYSTERECTOMY AND RIGHT  SALPINGECTOMY:  -   Cervix:             No dysplasia or malignancy identified.  Focal hyperkeratosis and parakeratosis.  -    Endometrium:        Secretory endometrium.  Negative for hyperplasia, intraepithelial neoplasia (EIN) and  malignancy.  -   Myometrium:         Adenomyosis.  -    Right fallopian tube:    No significant epithelial atypia  identified.  Features of prior tubal ligation.    Medications: She has a current medication list which includes the following prescription(s): solifenacin, acetaminophen, amlodipine, benzonatate, diclofenac sodium, ibuprofen, ibuprofen, and polyethylene glycol powder.   Allergies: Patient is  allergic to flagyl [metronidazole] and latex.   Physical Exam: BP 130/87   Pulse 83   Wt 230 lb (104.3 kg)   BMI 43.46 kg/m    Suprapubic incisions: well healed Pelvic Examination: Vagina: Incisions healing well. Sutures are present at incision line at the apex. Small area of granulation tissue on posterior vaginal wall, treated with silver nitrate. No tenderness along the anterior or posterior vagina. No apical tenderness. No pelvic masses. No visible or palpable mesh.  POP-Q: POP-Q  -2                                            Aa   -2                                           Ba  -7.5                                              C   3  Gh  5                                            Pb  8                                            tvl   -3                                            Ap  -3                                            Bp                                                 D     ---------------------------------------------------------  Assessment and Plan:  1. Post-operative state   2. Urge incontinence    - Healing well. - Do not recommend cleaning inside of the vagina as this will disrupt the pH. Can clean on inner labia with vulva and external vulva with gentle soap if needed or just water.  - Can resume regular activity including exercise. Wait for intercourse for an additional 6 weeks.  - Discussed avoidance of heavy lifting and straining long term to reduce the risk of recurrence.  - For urge incontinence symptoms, discussed options of pelvic PT or medication. She would like to start a medication. Vesicare 5mg  daily prescribed. Will have her follow up in 6 weeks to assess treatment.   [name], MD

## 2022-10-25 ENCOUNTER — Encounter: Payer: Self-pay | Admitting: *Deleted

## 2022-10-28 ENCOUNTER — Ambulatory Visit: Payer: Medicaid Other | Admitting: Orthopaedic Surgery

## 2022-11-02 ENCOUNTER — Ambulatory Visit: Payer: Medicaid Other | Admitting: Obstetrics and Gynecology
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
## 2022-11-18 ENCOUNTER — Encounter: Payer: Self-pay | Admitting: Obstetrics and Gynecology

## 2022-11-18 ENCOUNTER — Ambulatory Visit (INDEPENDENT_AMBULATORY_CARE_PROVIDER_SITE_OTHER): Payer: Medicaid Other | Admitting: Obstetrics and Gynecology

## 2022-11-18 VITALS — BP 122/87 | HR 80 | Wt 229.6 lb

## 2022-11-18 DIAGNOSIS — N3281 Overactive bladder: Secondary | ICD-10-CM | POA: Diagnosis not present

## 2022-11-18 DIAGNOSIS — N3941 Urge incontinence: Secondary | ICD-10-CM

## 2022-11-18 MED ORDER — SOLIFENACIN SUCCINATE 10 MG PO TABS: 10.0000 mg | ORAL_TABLET | Freq: Every day | ORAL | 5 refills | Status: DC

## 2022-11-18 NOTE — Patient Instructions (Signed)
You can double the '5mg'$  dose until you run out and then start the '10mg'$  Vesicare for Overactive bladder.

## 2022-11-22 ENCOUNTER — Ambulatory Visit: Payer: Medicaid Other | Admitting: Obstetrics and Gynecology

## 2022-12-02 ENCOUNTER — Ambulatory Visit (INDEPENDENT_AMBULATORY_CARE_PROVIDER_SITE_OTHER): Payer: Medicaid Other | Admitting: Physical Medicine and Rehabilitation

## 2022-12-02 ENCOUNTER — Ambulatory Visit (INDEPENDENT_AMBULATORY_CARE_PROVIDER_SITE_OTHER): Payer: Medicaid Other | Admitting: Student

## 2022-12-02 VITALS — BP 144/97 | HR 84

## 2022-12-02 VITALS — BP 137/92 | HR 77 | Ht 61.0 in | Wt 236.4 lb

## 2022-12-02 DIAGNOSIS — S161XXA Strain of muscle, fascia and tendon at neck level, initial encounter: Secondary | ICD-10-CM

## 2022-12-02 DIAGNOSIS — R1110 Vomiting, unspecified: Secondary | ICD-10-CM | POA: Insufficient documentation

## 2022-12-02 DIAGNOSIS — M542 Cervicalgia: Secondary | ICD-10-CM | POA: Diagnosis not present

## 2022-12-02 DIAGNOSIS — M7918 Myalgia, other site: Secondary | ICD-10-CM

## 2022-12-02 DIAGNOSIS — R197 Diarrhea, unspecified: Secondary | ICD-10-CM | POA: Diagnosis not present

## 2022-12-02 MED ORDER — METHOCARBAMOL 500 MG PO TABS: 500.0000 mg | ORAL_TABLET | Freq: Three times a day (TID) | ORAL | 0 refills | Status: DC

## 2022-12-02 MED ORDER — ONDANSETRON HCL 4 MG PO TABS: 4.0000 mg | ORAL_TABLET | Freq: Three times a day (TID) | ORAL | 0 refills | Status: DC | PRN

## 2022-12-02 MED ORDER — MELOXICAM 15 MG PO TABS: 15.0000 mg | ORAL_TABLET | Freq: Every day | ORAL | 0 refills | Status: DC

## 2022-12-02 NOTE — Progress Notes (Signed)
  SUBJECTIVE:   CHIEF COMPLAINT / HPI:   Nausea/vomiting: Patient presents with concerns of dizziness and vomiting. These episodes were strictly on the way there and on the way back while taking the bus. Started on 3/17 as the first day and then again on 3/23 on her way back. Last night she had another episode of vomiting. She then woke up tired, dizzy, and with headache and abdominal pain but notes these were after vomiting. Endorses small amount of diarrhea as well. Denies others around her having these symptoms. She has been able to drink and eat well. When she was on the bus, she took pills for nausea which did help some, but does not know the name of the medication. Notes emesis looked white and did not have any blood.  She denies alcohol or drug use.  PERTINENT  PMH / PSH: hysterectomy, GERD, HTN  Patient Care Team: Precious Gilding, DO as PCP - General (Family Medicine) OBJECTIVE:  BP (!) 137/92   Pulse 77   Ht 5\' 1"  (1.549 m)   Wt 236 lb 6.4 oz (107.2 kg)   LMP 08/07/2022 (Exact Date)   SpO2 98%   BMI 44.67 kg/m  General: Well-appearing, NAD HEENT: Clear oropharynx, no thyromegaly appreciated CV: RRR, murmurs auscultated Pulm: CTAB, normal WOB Abdomen: Soft, obese abdomen, nontender in all quadrants with the exception of central abdomen which is worsened with sitting up straight, normoactive bowel sounds  ASSESSMENT/PLAN:  Vomiting and diarrhea Assessment & Plan: Suspecting carsickness during bus rides but most recent episode is viral gastro with presentation of nausea vomiting diarrhea.  Will symptomatically control.  May consider IBS.  Unlikely to be pancreatitis, gallbladder etiology (cholecystectomy), pregnancy (hysterectomy), SBO.  Follow-up as needed.  Should this not resolve, would consider checking A1c, TSH, BMP, lipase.  Orders: -     Ondansetron HCl; Take 1 tablet (4 mg total) by mouth every 8 (eight) hours as needed for nausea or vomiting.  Dispense: 20 tablet; Refill:  0  Return if symptoms worsen or fail to improve. Wells Guiles, DO 12/02/2022, 3:54 PM PGY-2, [place]
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
## 2022-12-02 NOTE — Assessment & Plan Note (Addendum)
Suspecting carsickness during bus rides but most recent episode is viral gastro with presentation of nausea vomiting diarrhea.  Will symptomatically control.  May consider IBS.  Unlikely to be pancreatitis, gallbladder etiology (cholecystectomy), pregnancy (hysterectomy), SBO.  Follow-up as needed.  Should this not resolve, would consider checking A1c, TSH, BMP, lipase.

## 2022-12-02 NOTE — Progress Notes (Signed)
Functional Pain Scale - descriptive words and definitions  Distracting (5)    Aware of pain/able to complete some ADL's but limited by pain/sleep is affected and active distractions are only slightly useful. Moderate range order  Average Pain 5   Pain travels down rt arm. Worse in cold weather and if she touches area. 3 months, injury at work. Box fell on neck after lifting above head. Referred by lawyer.

## 2022-12-02 NOTE — Patient Instructions (Addendum)
  Fue genial verte hoy! [name] por elegir [facility] para su atencin primaria. Deborah Chandler fue atendida por nuseas y vmitos.  Hoy abordamos: 1. Le estoy recetando un antiemtico para ayudar con los vmitos llamado Zofran. Concntrese en los esfuerzos de hidratacin. Sospecho que esto est relacionado con la exposicin viral. Por favor regrese si esto no se resuelve y Microbiologist un diario/registro de cuando tenga sntomas.  Si an no lo ha hecho, regstrese en My Chart para tener fcil acceso a los resultados de sus laboratorios y Equities trader con su mdico de Midwife.  Llame a la clnica al [phone] si sus sntomas empeoran o tiene alguna inquietud.  Debe regresar a nuestra clnica. Regresar si los sntomas empeoran o no mejoran. Llegue 15 minutos antes de su cita para garantizar un proceso de registro sin problemas. Apreciamos sus esfuerzos para que esto suceda.  Thank you for allowing me to participate in your care, Deborah Guiles, DO 12/02/2022, 2:53 PM PGY-2, [place]

## 2022-12-06 ENCOUNTER — Encounter: Payer: Self-pay | Admitting: Physical Medicine and Rehabilitation

## 2022-12-22 ENCOUNTER — Ambulatory Visit: Payer: Medicaid Other
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
## 2023-01-10 ENCOUNTER — Other Ambulatory Visit: Payer: Self-pay

## 2023-01-10 ENCOUNTER — Ambulatory Visit: Payer: Medicaid Other | Attending: Physical Medicine and Rehabilitation | Admitting: Physical Therapy

## 2023-01-10 ENCOUNTER — Encounter: Payer: Self-pay | Admitting: Physical Therapy

## 2023-01-10 DIAGNOSIS — M542 Cervicalgia: Secondary | ICD-10-CM | POA: Insufficient documentation

## 2023-01-10 DIAGNOSIS — R293 Abnormal posture: Secondary | ICD-10-CM

## 2023-01-10 DIAGNOSIS — M6281 Muscle weakness (generalized): Secondary | ICD-10-CM | POA: Diagnosis not present

## 2023-01-10 DIAGNOSIS — M7918 Myalgia, other site: Secondary | ICD-10-CM | POA: Insufficient documentation

## 2023-01-10 DIAGNOSIS — S161XXA Strain of muscle, fascia and tendon at neck level, initial encounter: Secondary | ICD-10-CM | POA: Insufficient documentation

## 2023-01-25 ENCOUNTER — Ambulatory Visit: Payer: Medicaid Other | Admitting: Orthopaedic Surgery

## 2023-01-25 ENCOUNTER — Telehealth: Payer: Self-pay | Admitting: Orthopaedic Surgery

## 2023-01-25 ENCOUNTER — Ambulatory Visit: Payer: Medicaid Other | Admitting: Physical Therapy

## 2023-01-25 DIAGNOSIS — M542 Cervicalgia: Secondary | ICD-10-CM | POA: Diagnosis not present
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
## 2023-01-25 NOTE — Telephone Encounter (Signed)
Pt's daughter Deborah Chandler came in with complaint about the interpreter. Pt and daughter said that the pt was upset because she feel interpreter did not feel she the person interpret correctly. Pt states interpreter name was Malachi Bonds with at appt with Dr Roda Shutters appt date at 4 pm at 01/25/23. Pt is asking to know the interpreter full.name and phone number of interpreter line. Pt's daughter Deborah Chandler phone number is [phone]

## 2023-01-26 NOTE — Telephone Encounter (Signed)
I sent this info to Interpreting Services at [facility] and will have them reach out.  I do not know the requested info.

## 2023-01-27 ENCOUNTER — Ambulatory Visit: Payer: Medicaid Other

## 2023-01-27 DIAGNOSIS — M542 Cervicalgia: Secondary | ICD-10-CM | POA: Diagnosis not present

## 2023-01-27 DIAGNOSIS — R293 Abnormal posture: Secondary | ICD-10-CM

## 2023-01-27 DIAGNOSIS — M6281 Muscle weakness (generalized): Secondary | ICD-10-CM | POA: Diagnosis not present

## 2023-01-27 DIAGNOSIS — S161XXA Strain of muscle, fascia and tendon at neck level, initial encounter: Secondary | ICD-10-CM | POA: Diagnosis not present

## 2023-01-27 DIAGNOSIS — M7918 Myalgia, other site: Secondary | ICD-10-CM | POA: Diagnosis not present
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
## 2023-02-01 ENCOUNTER — Ambulatory Visit: Payer: Medicaid Other | Admitting: Physical Therapy

## 2023-02-01 ENCOUNTER — Encounter: Payer: Self-pay | Admitting: Physical Therapy

## 2023-02-01 DIAGNOSIS — M6281 Muscle weakness (generalized): Secondary | ICD-10-CM | POA: Diagnosis not present

## 2023-02-01 DIAGNOSIS — M7918 Myalgia, other site: Secondary | ICD-10-CM | POA: Diagnosis not present

## 2023-02-01 DIAGNOSIS — R293 Abnormal posture: Secondary | ICD-10-CM | POA: Diagnosis not present

## 2023-02-01 DIAGNOSIS — S161XXA Strain of muscle, fascia and tendon at neck level, initial encounter: Secondary | ICD-10-CM | POA: Diagnosis not present

## 2023-02-01 DIAGNOSIS — M542 Cervicalgia: Secondary | ICD-10-CM

## 2023-02-02 ENCOUNTER — Encounter: Payer: Self-pay | Admitting: Physical Medicine and Rehabilitation

## 2023-02-02 ENCOUNTER — Ambulatory Visit (INDEPENDENT_AMBULATORY_CARE_PROVIDER_SITE_OTHER): Payer: Medicaid Other | Admitting: Physical Medicine and Rehabilitation

## 2023-02-02 DIAGNOSIS — M5412 Radiculopathy, cervical region: Secondary | ICD-10-CM

## 2023-02-02 DIAGNOSIS — M7918 Myalgia, other site: Secondary | ICD-10-CM

## 2023-02-02 DIAGNOSIS — M542 Cervicalgia: Secondary | ICD-10-CM

## 2023-02-02 NOTE — Progress Notes (Signed)
Functional Pain Scale - descriptive words and definitions  Unmanageable (7)  Pain interferes with normal ADL's/nothing seems to help/sleep is very difficult/active distractions are very difficult to concentrate on. Severe range order  Average Pain  varies  Neck pain on right side that radiates into the right arm and upper back. Pain when raising right arm
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
## 2023-02-08 ENCOUNTER — Ambulatory Visit: Payer: Medicaid Other | Attending: Physical Medicine and Rehabilitation

## 2023-02-08 DIAGNOSIS — M542 Cervicalgia: Secondary | ICD-10-CM

## 2023-02-08 DIAGNOSIS — M6281 Muscle weakness (generalized): Secondary | ICD-10-CM | POA: Diagnosis not present

## 2023-02-08 DIAGNOSIS — R293 Abnormal posture: Secondary | ICD-10-CM | POA: Diagnosis not present
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
## 2023-02-10 ENCOUNTER — Ambulatory Visit: Payer: Medicaid Other | Admitting: Physical Therapy
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
## 2023-02-14 ENCOUNTER — Ambulatory Visit: Payer: Medicaid Other | Admitting: Physical Therapy

## 2023-02-14 ENCOUNTER — Encounter: Payer: Self-pay | Admitting: Physical Therapy

## 2023-02-14 DIAGNOSIS — R293 Abnormal posture: Secondary | ICD-10-CM | POA: Diagnosis not present

## 2023-02-14 DIAGNOSIS — M542 Cervicalgia: Secondary | ICD-10-CM

## 2023-02-14 DIAGNOSIS — M6281 Muscle weakness (generalized): Secondary | ICD-10-CM

## 2023-02-16 ENCOUNTER — Ambulatory Visit: Payer: Medicaid Other

## 2023-02-16 DIAGNOSIS — M6281 Muscle weakness (generalized): Secondary | ICD-10-CM

## 2023-02-16 DIAGNOSIS — M542 Cervicalgia: Secondary | ICD-10-CM | POA: Diagnosis not present

## 2023-02-16 DIAGNOSIS — R293 Abnormal posture: Secondary | ICD-10-CM
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
## 2023-02-21 ENCOUNTER — Ambulatory Visit: Payer: Medicaid Other | Admitting: Physical Therapy

## 2023-02-24 ENCOUNTER — Ambulatory Visit: Payer: Medicaid Other

## 2023-03-01 ENCOUNTER — Telehealth: Payer: Self-pay | Admitting: Physical Medicine and Rehabilitation

## 2023-03-01 NOTE — Telephone Encounter (Signed)
Called pt 1X and left vm for pt to call for MRI Review with [facility] after 7/10

## 2023-03-05 ENCOUNTER — Encounter (HOSPITAL_COMMUNITY): Payer: Self-pay | Admitting: Emergency Medicine

## 2023-03-05 ENCOUNTER — Ambulatory Visit (HOSPITAL_COMMUNITY)
Admission: EM | Admit: 2023-03-05 | Discharge: 2023-03-05 | Disposition: A | Discharge status: Home / Self Care | Payer: Medicaid Other | Attending: Physician Assistant | Admitting: Physician Assistant

## 2023-03-05 ENCOUNTER — Other Ambulatory Visit: Payer: Self-pay

## 2023-03-05 DIAGNOSIS — M7702 Medial epicondylitis, left elbow: Secondary | ICD-10-CM | POA: Diagnosis not present

## 2023-03-05 MED ORDER — METHOCARBAMOL 500 MG PO TABS: 500.0000 mg | ORAL_TABLET | Freq: Two times a day (BID) | ORAL | 0 refills | Status: DC

## 2023-03-05 MED ORDER — PREDNISONE 10 MG (21) PO TBPK: ORAL_TABLET | ORAL | 0 refills | Status: DC

## 2023-03-05 MED ORDER — LIDOCAINE 5 % EX OINT: 1.0000 | TOPICAL_OINTMENT | CUTANEOUS | 0 refills | Status: DC | PRN

## 2023-03-05 NOTE — ED Triage Notes (Addendum)
Left elbow pain, swollen.  This started yesterday.  Denies an injury.  Patient is right handed.    Has taken tylenol Patient had an ace wrap on elbow, patient removed it.  Points to underside of left elbow as painful and unable to extend left arm

## 2023-03-05 NOTE — Discharge Instructions (Addendum)
I believe that you have inflammation of part of your elbow.  Start prednisone as prescribed.  Do not take NSAIDs with this medication including aspirin, ibuprofen/Advil, naproxen/Aleve.  You can use acetaminophen/Tylenol for breakthrough pain.  Use ice and keep this elevated.  We have given you a sling for comfort but this should only be used for 1 to 2 days as it can cause problems with your shoulder.  Do not use it for longer than 2 days.  Take methocarbamol up to twice a day.  This make you sleepy so do not drive or drink alcohol while taking it.  You can use lidocaine on specific areas that are painful.  If your symptoms or not improving please follow-up with orthopedics; call to schedule an appointment.  If anything worsens you have increasing pain, fever, numbness or tingling in your hand, difficulty moving your hand, you need to be seen immediately.  Creo que tienes inflamacin de parte del codo.  Comience con prednisona segn lo recetado.  No tome AINE con este medicamento, incluidos aspirina, ibuprofeno/Advil, naproxeno/Aleve.  Puede usar acetaminofn/Tylenol para Chief Technology Officer irruptivo.  Use hielo y mantngalo elevado.  Le hemos proporcionado un cabestrillo para mayor comodidad, pero solo debe usarlo durante 1 o 2 [place], ya que puede causar problemas en el hombro.  No lo use por ms de 2 das.  Tome metocarbamol [employer] veces al da.  Esto le produce sueo, as que no conduzca ni beba alcohol mientras lo toma.  Puede usar [employer] en reas especficas que le duelan.  Si sus sntomas no mejoran, haga un seguimiento con un ortopedista; Llame para hacer una cita.  Si algo empeora, tiene dolor cada vez mayor, fiebre, entumecimiento u hormigueo en la mano, dificultad para mover la [place], necesita que lo [place] de inmediato.
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
## 2023-03-07 ENCOUNTER — Ambulatory Visit: Payer: Medicaid Other | Attending: Physical Medicine and Rehabilitation

## 2023-03-07 DIAGNOSIS — M542 Cervicalgia: Secondary | ICD-10-CM | POA: Insufficient documentation

## 2023-03-07 DIAGNOSIS — M6281 Muscle weakness (generalized): Secondary | ICD-10-CM | POA: Insufficient documentation

## 2023-03-07 DIAGNOSIS — R293 Abnormal posture: Secondary | ICD-10-CM | POA: Insufficient documentation

## 2023-03-16 ENCOUNTER — Ambulatory Visit
Admission: RE | Admit: 2023-03-16 | Discharge: 2023-03-16 | Disposition: A | Discharge status: Home / Self Care | Payer: Medicaid Other | Source: Ambulatory Visit | Attending: Physical Medicine and Rehabilitation | Admitting: Physical Medicine and Rehabilitation

## 2023-03-16 DIAGNOSIS — M542 Cervicalgia: Secondary | ICD-10-CM | POA: Diagnosis not present

## 2023-03-16 DIAGNOSIS — G8929 Other chronic pain: Secondary | ICD-10-CM | POA: Diagnosis not present

## 2023-03-17 ENCOUNTER — Ambulatory Visit: Payer: Medicaid Other

## 2023-03-24 ENCOUNTER — Ambulatory Visit: Payer: Medicaid Other

## 2023-03-24 DIAGNOSIS — M6281 Muscle weakness (generalized): Secondary | ICD-10-CM | POA: Diagnosis present

## 2023-03-24 DIAGNOSIS — M542 Cervicalgia: Secondary | ICD-10-CM | POA: Diagnosis present

## 2023-03-24 DIAGNOSIS — R293 Abnormal posture: Secondary | ICD-10-CM | POA: Diagnosis present
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
## 2023-04-04 ENCOUNTER — Other Ambulatory Visit (INDEPENDENT_AMBULATORY_CARE_PROVIDER_SITE_OTHER): Payer: Medicaid Other

## 2023-04-04 ENCOUNTER — Ambulatory Visit: Payer: Medicaid Other | Admitting: Orthopedic Surgery

## 2023-04-04 DIAGNOSIS — M542 Cervicalgia: Secondary | ICD-10-CM
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
## 2023-04-06 ENCOUNTER — Telehealth: Payer: Self-pay

## 2023-04-06 NOTE — Telephone Encounter (Signed)
Spoke with patient through interpreter service and scheduled OV for 04/08/23.

## 2023-04-06 NOTE — Telephone Encounter (Signed)
-----   Message from [place], [place] E sent at 03/24/2023  5:02 PM EDT ----- Need to see her back for OV to discuss cervical MRI imaging. Could consider trigger point injection vs facet joint injections. ----- Message ----- From: Interface, Rad Results In Sent: 03/24/2023   4:23 PM EDT To: Juanda Chance, NP

## 2023-04-08 ENCOUNTER — Encounter: Payer: Self-pay | Admitting: Physical Medicine and Rehabilitation

## 2023-04-08 ENCOUNTER — Ambulatory Visit: Payer: Medicaid Other | Admitting: Physical Medicine and Rehabilitation

## 2023-04-08 DIAGNOSIS — G894 Chronic pain syndrome: Secondary | ICD-10-CM

## 2023-04-08 DIAGNOSIS — M7918 Myalgia, other site: Secondary | ICD-10-CM

## 2023-04-08 DIAGNOSIS — M542 Cervicalgia: Secondary | ICD-10-CM | POA: Diagnosis not present

## 2023-04-08 NOTE — Progress Notes (Unsigned)
Functional Pain Scale - descriptive words and definitions  Uncomfortable (3)  Pain is present but can complete all ADL's/sleep is slightly affected and passive distraction only gives marginal relief. Mild range order  Average Pain 8 at night when sleeping  Neck pain. MRI review
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
## 2023-04-21 ENCOUNTER — Encounter: Payer: Self-pay | Admitting: Family Medicine

## 2023-04-21 ENCOUNTER — Ambulatory Visit (INDEPENDENT_AMBULATORY_CARE_PROVIDER_SITE_OTHER): Payer: Medicaid Other | Admitting: Family Medicine

## 2023-04-21 ENCOUNTER — Other Ambulatory Visit: Payer: Self-pay

## 2023-04-21 VITALS — BP 122/77 | HR 94 | Ht 61.0 in | Wt 237.8 lb

## 2023-04-21 DIAGNOSIS — R1110 Vomiting, unspecified: Secondary | ICD-10-CM

## 2023-04-21 DIAGNOSIS — R197 Diarrhea, unspecified: Secondary | ICD-10-CM

## 2023-04-21 DIAGNOSIS — R110 Nausea: Secondary | ICD-10-CM | POA: Diagnosis not present

## 2023-04-21 MED ORDER — ONDANSETRON HCL 4 MG PO TABS
4.0000 mg | ORAL_TABLET | Freq: Three times a day (TID) | ORAL | 0 refills | Status: DC | PRN
Start: 2023-04-21 — End: 2023-10-18

## 2023-04-21 NOTE — Patient Instructions (Addendum)
It was wonderful to see you today.  Please bring ALL of your medications with you to every visit.   Today we talked about:  --Returning to discuss recurrent dizziness and nausea with headaches with your new primary physician--Dr. Erick Alley  -- We will get blood work today  I will message you with results of this and the COVID test  Drink 8 glasses of water each day Eat a bland diet  I sent in Zofran for your nausea You can take Tylenol every 8 hours for discomfort    Please follow up in 1 months   Thank you for choosing [facility].   Please call [phone] with any questions about today's appointment.  Please be sure to schedule follow up at the front  desk before you leave today.   [name], MD  Family Medicine   Fue maravilloso verte hoy.  Traiga TODOS sus medicamentos a cada visita.   Hoy hablamos de:  --Volviendo a Deborah Chandler recurrentes y las nuseas con dolores de cabeza con su nuevo mdico de atencin primaria--Dr. Jarvis Newcomer  -- Nos haremos anlisis de [place].  Te enviar un mensaje con los resultados de esta y la prueba de COVID.  Beba 8 vasos de Regulatory affairs officer. Coma una dieta blanda  Envi a Zofran por tus nuseas. Puedes tomar Tylenol cada 8 horas para las molestias.    Por favor haga un seguimiento en 1 mes.   Karl Pock por elegir [facility].   Llame al [phone] si tiene alguna pregunta sobre la cita de [place].  Asegrese de programar un seguimiento en la recepcin antes de irse hoy.   [name], MD  [facility]

## 2023-04-21 NOTE — Progress Notes (Addendum)
    SUBJECTIVE:   CHIEF COMPLAINT: multiple concerns HPI:  The patient speaks Spanish as their primary language.  An interpreter was used for the entire visit.   Deborah Chandler is a 42 y.o.  with history notable for HTN and morbid obesity  presenting for multiple concerns. She reports 18 hours of a mild headache, congestion, nausea, intermittent vomiting, cough, and sore throat. Started after church. Deborah Chandler has a sore throat as well.   In addition to above, he has associated dizziness, sensitivity to light and sound. She has had recurrent episodes of this over the past few months. She was seen  by Dr. Royal Piedra who felt this may be motion sickness. She has had a prior CT head which was negative. She has had a cholecystectomy but still has these symptoms several times a month.    PERTINENT  PMH / PSH/Family/Social History : HTN well controlled Morbid obesity   OBJECTIVE:   BP 122/77   Pulse 94   Ht 5\' 1"  (1.549 m)   Wt 237 lb 12.8 oz (107.9 kg)   LMP 08/07/2022 (Exact Date)   SpO2 98%   BMI 44.93 kg/m   Today's weight:  Last Weight  Most recent update: 04/21/2023 11:40 AM    Weight  107.9 kg (237 lb 12.8 oz)            Review of prior weights: Filed Weights   04/21/23 1140  Weight: 237 lb 12.8 oz (107.9 kg)   Overall in no distress, does seem a bit tired but alert and appropriate  HEENT: EOMI. Sclera without injection or icterus. MMM. External auditory canal examined and WNL. TM normal appearance, no erythema or bulging. Oropharynx with mild post nasal drip  Neck: Supple.  Cardiac: Regular rate and rhythm. Normal S1/S2. No murmurs, rubs, or gallops appreciated. Lungs: Clear bilaterally to ascultation.  Abdomen: Normoactive bowel sounds. No tenderness to deep or light palpation. No rebound or guarding.    Neuro:   Neuro: CN II: PERRL CN III, IV,VI: EOMI CV V: Normal sensation in V1, V2, V3 CVII: Symmetric smile and brow raise CN VIII: Normal hearing CN IX,X:  Symmetric palate raise  CN XI: 5/5 shoulder shrug CN XII: Symmetric tongue protrusion  UE and LE strength 5/5 2+ biceps reflexes  Psych: Pleasant and appropriate    ASSESSMENT/PLAN:   Viral syndrome No AOM one exam, unlikely Strep with congestion, cough and age  Less likely allergies given acute onset  Likely viral but I strongly suspect she has recurrent migraines playing a role in these episodes of nausea, vomiting and headaches  Zofran for nausea Discussed appropriate care for viral illness including nasal saline, humidified air, honey, drinking fluids including dilute juice, Tylenol, and Motrin.  COVID test, CBC and CMP given vomiting Work note given to return Monday   I am concerned she may have migraines causing these recurrent presentations and ED visits for nausea and vomiting with dizziness.  Scheduled for extended PCP visit in September. Patient's daughter wants this to be a physical---discussed that would need to be separate visit as she has not seen Dr. Yetta Barre in some time.   Obesity, morbid Patient curious about BMI Discussed imperfect marker Encouraged increased fruit and veggies--which are apparently covered by her insurance    [name], MD  Family Medicine Teaching Service  [facility]

## 2023-04-22 LAB — COMPREHENSIVE METABOLIC PANEL
ALT: 17 IU/L (ref 0–32)
AST: 14 IU/L (ref 0–40)
Albumin: 3.8 g/dL — ABNORMAL LOW (ref 3.9–4.9)
Alkaline Phosphatase: 73 IU/L (ref 44–121)
BUN/Creatinine Ratio: 20 (ref 9–23)
BUN: 12 mg/dL (ref 6–24)
Bilirubin Total: 0.5 mg/dL (ref 0.0–1.2)
CO2: 21 mmol/L (ref 20–29)
Calcium: 8.9 mg/dL (ref 8.7–10.2)
Chloride: 104 mmol/L (ref 96–106)
Creatinine, Ser: 0.6 mg/dL (ref 0.57–1.00)
Globulin, Total: 2.8 g/dL (ref 1.5–4.5)
Glucose: 127 mg/dL — ABNORMAL HIGH (ref 70–99)
Potassium: 4.3 mmol/L (ref 3.5–5.2)
Sodium: 140 mmol/L (ref 134–144)
Total Protein: 6.6 g/dL (ref 6.0–8.5)
eGFR: 116 mL/min/{1.73_m2} (ref >59)

## 2023-04-22 LAB — CBC
Hematocrit: 37.5 % (ref 34.0–46.6)
Hemoglobin: 11.7 g/dL (ref 11.1–15.9)
MCH: 23.3 pg — ABNORMAL LOW (ref 26.6–33.0)
MCHC: 31.2 g/dL — ABNORMAL LOW (ref 31.5–35.7)
MCV: 75 fL — ABNORMAL LOW (ref 79–97)
Platelets: 248 10*3/uL (ref 150–450)
RBC: 5.03 x10E6/uL (ref 3.77–5.28)
RDW: 15.2 % (ref 11.7–15.4)
WBC: 5.6 10*3/uL (ref 3.4–10.8)

## 2023-04-23 ENCOUNTER — Telehealth: Payer: Self-pay | Admitting: Family Medicine

## 2023-04-23 ENCOUNTER — Encounter: Payer: Self-pay | Admitting: Family Medicine

## 2023-04-23 LAB — NOVEL CORONAVIRUS, NAA: SARS-CoV-2, NAA: DETECTED — AB

## 2023-04-23 MED ORDER — PAXLOVID (300/100) 20 X 150 MG & 10 X 100MG PO TBPK: 3.0000 | ORAL_TABLET | Freq: Two times a day (BID) | ORAL | 0 refills | Status: AC

## 2023-04-23 NOTE — Telephone Encounter (Signed)
Attempted to call patient, daughter, and son with Spanish interpreter.  Unable to reach. Left voicemail with patient.   Will send via mychart.   If patient calls back She had COVID 19 Please give return precautions I sent in Paxlovid She should stop amlodipine while taking Paxlovid   Please let me know if she has questions.  [name], MD  Family Medicine Teaching Service

## 2023-04-25 NOTE — Telephone Encounter (Signed)
Please write letter to extend time off work through this Friday.  [name], MD  Family Medicine Teaching Service

## 2023-04-25 NOTE — Telephone Encounter (Signed)
Patient's daughter returns call to nurse line. Advised of message from Dr. Manson Passey. She states that mother has been taking Paxlovid since yesterday.   Daughter is asking if work letter can be extended due to patient still having symptoms. Patient continues to have vomiting, headache and body aches. She is still tolerating fluids at this time.   Advised of ED precautions. Supportive measures discussed.   Daughter is requesting that follow up message be sent through [facility], as they have to be connected to [facility] to receive calls.   Will forward to Dr. Manson Passey.   Veronda Prude, RN

## 2023-04-25 NOTE — Telephone Encounter (Signed)
Work letter updated and sent via [employer]. Message sent to patient informing that this has been completed.   Veronda Prude, RN

## 2023-04-26 ENCOUNTER — Ambulatory Visit: Payer: Medicaid Other | Admitting: Physical Therapy

## 2023-05-11 ENCOUNTER — Ambulatory Visit: Payer: Medicaid Other | Attending: Physical Medicine and Rehabilitation

## 2023-05-23 ENCOUNTER — Ambulatory Visit: Payer: Medicaid Other | Admitting: Obstetrics and Gynecology

## 2023-05-24 ENCOUNTER — Encounter: Payer: Self-pay | Admitting: Student

## 2023-05-24 ENCOUNTER — Ambulatory Visit (INDEPENDENT_AMBULATORY_CARE_PROVIDER_SITE_OTHER): Payer: Medicaid Other | Admitting: Student

## 2023-05-24 ENCOUNTER — Other Ambulatory Visit: Payer: Self-pay

## 2023-05-24 VITALS — BP 152/99 | HR 80 | Ht 60.5 in | Wt 241.6 lb

## 2023-05-24 DIAGNOSIS — Z1159 Encounter for screening for other viral diseases: Secondary | ICD-10-CM

## 2023-05-24 DIAGNOSIS — R7303 Prediabetes: Secondary | ICD-10-CM

## 2023-05-24 DIAGNOSIS — Z131 Encounter for screening for diabetes mellitus: Secondary | ICD-10-CM | POA: Diagnosis not present

## 2023-05-24 DIAGNOSIS — K589 Irritable bowel syndrome without diarrhea: Secondary | ICD-10-CM

## 2023-05-24 DIAGNOSIS — M79672 Pain in left foot: Secondary | ICD-10-CM

## 2023-05-24 DIAGNOSIS — I10 Essential (primary) hypertension: Secondary | ICD-10-CM | POA: Diagnosis not present

## 2023-05-24 DIAGNOSIS — Z13220 Encounter for screening for lipoid disorders: Secondary | ICD-10-CM

## 2023-05-24 DIAGNOSIS — Z23 Encounter for immunization: Secondary | ICD-10-CM

## 2023-05-24 DIAGNOSIS — Z0000 Encounter for general adult medical examination without abnormal findings: Secondary | ICD-10-CM

## 2023-05-24 DIAGNOSIS — M79671 Pain in right foot: Secondary | ICD-10-CM

## 2023-05-24 DIAGNOSIS — Z1239 Encounter for other screening for malignant neoplasm of breast: Secondary | ICD-10-CM | POA: Diagnosis not present

## 2023-05-24 LAB — POCT GLYCOSYLATED HEMOGLOBIN (HGB A1C): Hemoglobin A1C: 6.3 % — AB (ref 4.0–5.6)

## 2023-05-24 MED ORDER — AMLODIPINE BESYLATE 5 MG PO TABS
5.0000 mg | ORAL_TABLET | Freq: Every day | ORAL | 3 refills | Status: DC
Start: 2023-05-24 — End: 2023-06-24

## 2023-05-24 NOTE — Patient Instructions (Addendum)
It was great to see you! Thank you for allowing me to participate in your care!  I recommend that you always bring your medications to each appointment as this makes it easy to ensure you are on the correct medications and helps [place] not miss when refills are needed.  Our plans for today:  - I think your frequent bowel movements is most likely related to IBS. If these symptoms are concerning to you please return to further discuss. I do recommend increasing fiber in your diet (whole fruits and vegetables). See attached information on diabetic diet.  - Take your blood pressure medication every day. Return in the next month for a blood pressure check. It is possible your throbbing feet are related to your uncontrolled high blood pressure.   We are checking some labs today, I will call you if they are abnormal will send you a MyChart message or a letter if they are normal.  If you do not hear about your labs in the next 2 weeks please let us know.  Take care and seek immediate care sooner if you develop any concerns.   Dr. Erick Alley, DO [facility]

## 2023-05-24 NOTE — Progress Notes (Signed)
    SUBJECTIVE:   Chief compliant/HPI: annual examination  Deborah Chandler is a 42 y.o. who presents today for an annual exam.   History tabs reviewed and updated.   HTN Takes amlodipine a couple times a week when she thinks about it.  Bilateral foot pain For past several months feels pulsing/pressure pain in bilateral feet only at night time when trying to sleep, more on the L. Hasn't tried anything for the pain. Sometimes keeping her up at night.   Frequent bowel movements For long time patient has experienced frequent bowel movements shortly after eating.  This has increased since having her gallbladder removed.  Sometimes she has diarrhea and sometimes solid BMs.  No blood in stool.  No recent weight loss    OBJECTIVE:   BP (!) 152/99   Pulse 80   Ht 5' 0.5" (1.537 m)   Wt 241 lb 9.6 oz (109.6 kg)   LMP 08/07/2022 (Exact Date)   SpO2 95%   BMI 46.41 kg/m    General: Well-appearing 42 year-old morbidly obese female, NAD Cardio: RRR, normal S1/S2, no murmur Lungs: CTAB, normal effort Abdomen: Soft, nontender palpation, nondistended bowel sounds present Neuro: Alert, no focal deficits Extremities: No edema BLEs, posterior tibial and dorsalis pedis pulses present, sensation intact, no deformities Psych: Mood affect appropriate for situation  ASSESSMENT/PLAN:   IBS (irritable bowel syndrome) Frequent bowel movements is likely symptoms of IBS although she used to have constipation type.  No red flag symptoms at this time. -Increase fiber in diet, provided handout of diabetic diet to follow -CTM, can consider medications for IBS at future visit if desired  Hypertension Uncontrolled. -Refilled amlodipine -Patient advised to take medication every single day -Return in 2 to 4 weeks for follow-up  Pain in both feet Throbbing bilateral foot pain only at nighttime could be related to uncontrolled hypertension and/or obesity.  Nothing concerning on exam. -Continue  to monitor, will focus on controlling HTN  Pre-diabetes A1c 6.3 today.   Discussed and provided information on diabetes and nutrition Can discuss increasing physical activity and weight loss at next visit if appropriate.  Can consider EXHBZJ although I would be hesitant with the patient's PMH of stomach issues    Annual Examination  See AVS for age appropriate recommendations.   PHQ score : Patient did not complete Contraception: History of hysterectomy.  Considered the following items based upon USPSTF recommendations: Diabetes screening: ordered Screening for elevated cholesterol: ordered HIV testing:  Neg in 2018 Hepatitis C: ordered Hepatitis B: discussed Syphilis if at high risk: discussed GC/CT not at high risk and not ordered. Reviewed risk factors for latent tuberculosis and not indicated  Cervical cancer screening:  Prior pap in 2021 normal and had hysterectomy in 2021 Breast cancer screening:  Mammogram ordered    Erick Alley, DO [facility]

## 2023-05-24 NOTE — Assessment & Plan Note (Signed)
Uncontrolled. -Refilled amlodipine -Patient advised to take medication every single day -Return in 2 to 4 weeks for follow-up

## 2023-05-24 NOTE — Assessment & Plan Note (Signed)
Frequent bowel movements is likely symptoms of IBS although she used to have constipation type.  No red flag symptoms at this time. -Increase fiber in diet, provided handout of diabetic diet to follow -CTM, can consider medications for IBS at future visit if desired

## 2023-05-24 NOTE — Assessment & Plan Note (Addendum)
A1c 6.3 today.   Discussed and provided information on diabetes and nutrition Can discuss increasing physical activity and weight loss at next visit if appropriate.  Can consider Reginal Lutes although I would be hesitant with the patient's PMH of stomach issues

## 2023-05-24 NOTE — Assessment & Plan Note (Signed)
Throbbing bilateral foot pain only at nighttime could be related to uncontrolled hypertension and/or obesity.  Nothing concerning on exam. -Continue to monitor, will focus on controlling HTN

## 2023-05-28 ENCOUNTER — Telehealth (HOSPITAL_COMMUNITY): Payer: Self-pay | Admitting: Emergency Medicine

## 2023-05-28 ENCOUNTER — Ambulatory Visit (HOSPITAL_COMMUNITY)
Admission: EM | Admit: 2023-05-28 | Discharge: 2023-05-28 | Disposition: A | Discharge status: Home / Self Care | Payer: Medicaid Other | Attending: Physician Assistant | Admitting: Physician Assistant

## 2023-05-28 ENCOUNTER — Ambulatory Visit (INDEPENDENT_AMBULATORY_CARE_PROVIDER_SITE_OTHER): Payer: Medicaid Other

## 2023-05-28 ENCOUNTER — Encounter (HOSPITAL_COMMUNITY): Payer: Self-pay

## 2023-05-28 DIAGNOSIS — S63616A Unspecified sprain of right little finger, initial encounter: Secondary | ICD-10-CM | POA: Diagnosis not present

## 2023-05-28 DIAGNOSIS — R109 Unspecified abdominal pain: Secondary | ICD-10-CM | POA: Diagnosis not present

## 2023-05-28 DIAGNOSIS — S61309A Unspecified open wound of unspecified finger with damage to nail, initial encounter: Secondary | ICD-10-CM | POA: Diagnosis not present

## 2023-05-28 DIAGNOSIS — M7989 Other specified soft tissue disorders: Secondary | ICD-10-CM | POA: Diagnosis not present

## 2023-05-28 MED ORDER — MUPIROCIN 2 % EX OINT: 1.0000 | TOPICAL_OINTMENT | Freq: Every day | CUTANEOUS | 0 refills | Status: DC

## 2023-05-28 MED ORDER — IBUPROFEN 800 MG PO TABS
800.0000 mg | ORAL_TABLET | Freq: Once | ORAL | Status: AC
  Administered 2023-05-28: 800 mg via ORAL

## 2023-05-28 MED ORDER — IBUPROFEN 800 MG PO TABS: 800.0000 mg | ORAL_TABLET | Freq: Three times a day (TID) | ORAL | 0 refills | Status: DC

## 2023-05-28 MED ORDER — DICYCLOMINE HCL 20 MG PO TABS: 20.0000 mg | ORAL_TABLET | Freq: Two times a day (BID) | ORAL | 0 refills | Status: DC

## 2023-05-28 MED ORDER — IBUPROFEN 800 MG PO TABS
ORAL_TABLET | ORAL | Status: AC
  Filled 2023-05-28: qty 1

## 2023-05-28 MED ORDER — DICYCLOMINE HCL 20 MG PO TABS
20.0000 mg | ORAL_TABLET | Freq: Two times a day (BID) | ORAL | 0 refills | Status: DC
Start: 2023-05-28 — End: 2023-05-28

## 2023-05-28 NOTE — ED Notes (Signed)
Changed pharmacy .  Initial pharmacy was closed by the time patient was discharged
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
## 2023-05-28 NOTE — Discharge Instructions (Addendum)
The x-ray was normal with no evidence of a fracture.  I believe you have sprained the finger.  Take ibuprofen 800 mg for pain relief.  Do not take NSAIDs with this medication including aspirin, ibuprofen/Advil, naproxen/Aleve.  Try to keep the nail down is much as possible.  Keep it wrapped.  Go to the nail salon to have the artificial nail portion removed.  If anything worsens or changes and it becomes red, swollen, painful, drains any fluid please be seen immediately.  Take dicyclomine up to twice a day for abdominal cramping and discomfort.  Eat a bland diet and avoid spicy/acidic/fatty foods.  If your symptoms are not improving or if anything worsens and you have severe abdominal pain, fever, nausea, vomiting you need to be seen immediately.  La radiografa fue normal sin evidencia de fractura.  Creo que te has torcido Set designer.  Tome ibuprofeno 800 mg para Engineer, materials.  No tome AINE con este medicamento, incluidos aspirina, ibuprofeno/Advil, naproxeno/Aleve.  Trate de [employer] la ua hacia abajo tanto como sea posible.  Mantenlo envuelto.  Vaya al saln de uas para que le quiten la parte de ua artificial.  Si algo empeora o cambia y se enrojece, se hincha, duele o drena algn lquido, acuda inmediatamente.  Tome diciclomina [employer] veces al da para los calambres y molestias abdominales.  Consuma una dieta blanda y [employer] alimentos picantes, cidos o grasos.  Si sus sntomas no mejoran o si algo empeora y tiene dolor abdominal intenso, fiebre, nuseas o vmitos, debe ser atendido de inmediato.

## 2023-05-28 NOTE — ED Triage Notes (Signed)
Here for R-pinky finger pain and swelling.

## 2023-06-02 ENCOUNTER — Encounter: Payer: Self-pay | Admitting: Obstetrics and Gynecology

## 2023-06-02 ENCOUNTER — Ambulatory Visit: Payer: Medicaid Other | Admitting: Obstetrics and Gynecology

## 2023-06-02 VITALS — BP 142/89 | HR 85

## 2023-06-02 DIAGNOSIS — M6289 Other specified disorders of muscle: Secondary | ICD-10-CM | POA: Diagnosis not present

## 2023-06-02 DIAGNOSIS — N393 Stress incontinence (female) (male): Secondary | ICD-10-CM | POA: Diagnosis not present

## 2023-06-02 DIAGNOSIS — N3281 Overactive bladder: Secondary | ICD-10-CM

## 2023-06-02 MED ORDER — SOLIFENACIN SUCCINATE 10 MG PO TABS
10.0000 mg | ORAL_TABLET | Freq: Every day | ORAL | 2 refills | Status: DC
Start: 2023-06-02 — End: 2024-01-12
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
## 2023-06-09 ENCOUNTER — Ambulatory Visit
Admission: RE | Admit: 2023-06-09 | Discharge: 2023-06-09 | Disposition: A | Discharge status: Home / Self Care | Payer: Medicaid Other | Source: Ambulatory Visit | Attending: Family Medicine | Admitting: Family Medicine

## 2023-06-09 DIAGNOSIS — Z1231 Encounter for screening mammogram for malignant neoplasm of breast: Secondary | ICD-10-CM | POA: Diagnosis not present

## 2023-06-09 DIAGNOSIS — Z1239 Encounter for other screening for malignant neoplasm of breast: Secondary | ICD-10-CM

## 2023-06-23 NOTE — Progress Notes (Deleted)
    SUBJECTIVE:   CHIEF COMPLAINT / HPI:   HTN Currently taking amlodipine ***   PERTINENT  PMH / PSH: ***  OBJECTIVE:   LMP 08/07/2022 (Exact Date)  ***  General: NAD, pleasant, able to participate in exam Cardiac: RRR, no murmurs. Respiratory: CTAB, normal effort, No wheezes, rales or rhonchi Abdomen: Bowel sounds present, nontender, nondistended, no hepatosplenomegaly. Extremities: no edema or cyanosis. Skin: warm and dry, no rashes noted Neuro: alert, no obvious focal deficits Psych: Normal affect and mood  ASSESSMENT/PLAN:   No problem-specific Assessment & Plan notes found for this encounter.     Dr. Erick Alley, DO [facility]    {    This will disappear when note is signed, click to select method of visit    :1}

## 2023-06-24 ENCOUNTER — Ambulatory Visit (INDEPENDENT_AMBULATORY_CARE_PROVIDER_SITE_OTHER): Payer: Medicaid Other | Admitting: Family Medicine

## 2023-06-24 VITALS — BP 138/86 | HR 84 | Ht 61.0 in | Wt 240.2 lb

## 2023-06-24 DIAGNOSIS — R7303 Prediabetes: Secondary | ICD-10-CM

## 2023-06-24 DIAGNOSIS — I10 Essential (primary) hypertension: Secondary | ICD-10-CM | POA: Diagnosis not present

## 2023-06-24 MED ORDER — AMLODIPINE-OLMESARTAN 5-20 MG PO TABS: 1.0000 | ORAL_TABLET | Freq: Every day | ORAL | 1 refills | Status: DC

## 2023-06-24 NOTE — Progress Notes (Unsigned)
    SUBJECTIVE:   CHIEF COMPLAINT / HPI:   Hypertension: - Medications: Amlodipine 5mg  - Compliance: Yes - Checking BP at home: Not taking - Denies any SOB, CP, vision changes, LE edema, medication SEs, or symptoms of hypotension.   PERTINENT  PMH / PSH: IBS, hypertension, prediabetes  OBJECTIVE:   BP 138/86 (BP Location: Left Arm, Cuff Size: Large)   LMP 08/07/2022 (Exact Date)    General: NAD, pleasant, able to participate in exam Cardiac: RRR, no murmurs. Respiratory: CTAB, normal effort, No wheezes, rales or rhonchi Extremities: no edema or cyanosis. Skin: warm and dry, no rashes noted Neuro: alert, no obvious focal deficits Psych: Normal affect and mood  ASSESSMENT/PLAN:   Assessment & Plan Primary hypertension 138/86 today, borderline elevated.  Taking amlodipine 5 mg daily, likely to need additional therapy for better control.  Discussed ambulatory BP monitoring vs trialing medication increase, patient opted for combination pill for BP control. -Start Azor 5-20 daily -Scheduled for follow-up in 2 weeks with Dr. Yetta Barre for BP check and likely BMP Pre-diabetes A1c 6.3 at last visit.  Reiterated healthy lifestyle and exercise choices to prevent progression to diabetes.  Patient to follow-up with PCP for A1c in the future    Dr. Elberta Fortis, DO [facility]    {    This will disappear when note is signed, click to select method of visit    :1}

## 2023-06-24 NOTE — Assessment & Plan Note (Signed)
138/86 today, borderline elevated.  Taking amlodipine 5 mg daily, likely to need additional therapy for better control.  Discussed ambulatory BP monitoring vs trialing medication increase, patient opted for combination pill for BP control. -Start Azor 5-20 daily -Scheduled for follow-up in 2 weeks with Dr. Yetta Barre for BP check and likely BMP

## 2023-06-24 NOTE — Patient Instructions (Addendum)
  Fue maravilloso verte hoy! Karl Pock por elegir [facility].   Traiga TODOS sus medicamentos a cada visita.   Hoy hablamos de:  1. Para su presin arterial, deje de tomar amlodipino y comience a tomar la pastilla combinada Azor para su presin arterial.  Tmelo una vez al da.  Le gustara que hiciera un seguimiento con la Dra. Yetta Barre en 2 semanas para controlar su presin arterial y probablemente ella tambin revisar un laboratorio en ese momento.  Por favor haga un seguimiento en 2 semanas.   Si an no lo ha hecho, regstrese en My Chart para tener fcil acceso a los resultados de sus laboratorios y Engineer, materials con su mdico de Marine scientist.  Llame a la clnica al [phone] si sus sntomas empeoran o tiene alguna inquietud.  Asegrese de programar un seguimiento en la recepcin antes de irse hoy.   Douglass Rivers, DO [facility]  It was wonderful to see you today! Thank you for choosing [facility].   Please bring ALL of your medications with you to every visit.   Today we talked about:  For your blood pressure please stop taking the amlodipine and start taking the combination pill Azor for your blood pressure.  Please take it once per day.  Would like you to follow-up with Dr. Yetta Barre in 2 weeks for blood pressure check and she will likely check 1 lab at that time as well.  Please follow up in 2 weeks   If you haven't already, sign up for My Chart to have easy access to your labs results, and communication with your primary care physician.  Call the clinic at [phone] if your symptoms worsen or you have any concerns.  Please be sure to schedule follow up at the front desk before you leave today.   Elberta Fortis, DO Family Medicine

## 2023-06-24 NOTE — Assessment & Plan Note (Signed)
A1c 6.3 at last visit.  Reiterated healthy lifestyle and exercise choices to prevent progression to diabetes.  Patient to follow-up with PCP for A1c in the future

## 2023-07-11 ENCOUNTER — Ambulatory Visit: Payer: Self-pay | Admitting: Student

## 2023-07-11 NOTE — Progress Notes (Deleted)
    SUBJECTIVE:   CHIEF COMPLAINT / HPI:   HTN Last seen on 06/24/2023 and started on Azor 5-20 mg daily  PERTINENT  PMH / PSH: ***  OBJECTIVE:   LMP 08/07/2022 (Exact Date)  ***  General: NAD, pleasant, able to participate in exam Cardiac: RRR, no murmurs. Respiratory: CTAB, normal effort, No wheezes, rales or rhonchi Abdomen: Bowel sounds present, nontender, nondistended, no hepatosplenomegaly. Extremities: no edema or cyanosis. Skin: warm and dry, no rashes noted Neuro: alert, no obvious focal deficits Psych: Normal affect and mood  ASSESSMENT/PLAN:   No problem-specific Assessment & Plan notes found for this encounter.     Dr. Erick Alley, DO [place] [facility]    {    This will disappear when note is signed, click to select method of visit    :1}

## 2023-09-01 ENCOUNTER — Encounter: Payer: Medicaid Other | Attending: Obstetrics and Gynecology | Admitting: Physical Therapy
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
## 2023-09-08 ENCOUNTER — Encounter: Payer: Medicaid Other | Attending: Obstetrics and Gynecology | Admitting: Physical Therapy

## 2023-09-08 ENCOUNTER — Encounter: Payer: Self-pay | Admitting: Physical Therapy

## 2023-09-08 ENCOUNTER — Other Ambulatory Visit: Payer: Self-pay

## 2023-09-08 DIAGNOSIS — M6281 Muscle weakness (generalized): Secondary | ICD-10-CM

## 2023-09-08 DIAGNOSIS — R293 Abnormal posture: Secondary | ICD-10-CM | POA: Insufficient documentation

## 2023-09-08 DIAGNOSIS — N3281 Overactive bladder: Secondary | ICD-10-CM | POA: Insufficient documentation

## 2023-09-08 DIAGNOSIS — M6289 Other specified disorders of muscle: Secondary | ICD-10-CM | POA: Insufficient documentation

## 2023-09-08 DIAGNOSIS — N393 Stress incontinence (female) (male): Secondary | ICD-10-CM | POA: Insufficient documentation

## 2023-09-08 DIAGNOSIS — M542 Cervicalgia: Secondary | ICD-10-CM | POA: Diagnosis not present

## 2023-09-08 DIAGNOSIS — R102 Pelvic and perineal pain: Secondary | ICD-10-CM
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
## 2023-09-09 ENCOUNTER — Ambulatory Visit: Payer: Medicaid Other | Admitting: Family Medicine

## 2023-09-09 VITALS — BP 130/90 | HR 78 | Temp 97.9°F | Ht 61.0 in | Wt 244.2 lb

## 2023-09-09 DIAGNOSIS — B349 Viral infection, unspecified: Secondary | ICD-10-CM | POA: Diagnosis not present

## 2023-09-09 NOTE — Progress Notes (Signed)
    SUBJECTIVE:   CHIEF COMPLAINT / HPI:  Viral syndrome  Patient reports congestion and general intermittent headache  Symptoms started Sunday  No fevers at home  Sore throat  No vomiting or diarrhea  Her son has been sick as well with some type of asthma  Her grandson also has flu like symptoms  No troubles breathing  Coughing a little    PERTINENT  PMH / PSH: HTN  OBJECTIVE:   BP (!) 125/95   Pulse 78   Temp 97.9 F (36.6 C)   Ht 5' 1 (1.549 m)   Wt 244 lb 3.2 oz (110.8 kg)   LMP 08/07/2022 (Exact Date)   SpO2 100%   BMI 46.14 kg/m   General: well appearing, in no acute distress HEENT: no congestion visible, no rhinorrhea , no cervical adenopathy  CV: RRR, radial pulses equal and palpable, no BLE edema  Resp: Normal work of breathing on room air, CTAB    ASSESSMENT/PLAN:   Assessment & Plan Acute viral syndrome Symptoms and exam indicative of uncomplicated viral syndrome.  - Counseled regarding non pharmacologic therapies for viral uri  - mucinex  or flonase  as needed for congestion      Areta Saliva, MD [facility]

## 2023-09-09 NOTE — Patient Instructions (Addendum)
 Gracias por venir a glass blower/designer. Lo ms probable es que tenga una enfermedad viral que est causando sus sntomas. Puede tomar tylenol  para ayudar con environmental health practitioner general y Flonase  o fluticasona para la congestin. Esto es de venta libre y es un aerosol nasal. Otro medicamento que puede tomar sin receta se llama mucinex . Sus sntomas deberan games developer a scientist, water quality los prximos [employer].  Su presin arterial es mucho mejor que antes. No me preocupa que el nmero inferior sea ligeramente elevado. Esto podra deberse a que ests enfermo. Podemos hacer un seguimiento despus de que ya no est enfermo. Si todava est alto en ese momento podemos ajustar su medicacin segn corresponda.

## 2023-09-15 ENCOUNTER — Encounter: Payer: Medicaid Other | Admitting: Physical Therapy

## 2023-09-15 ENCOUNTER — Encounter: Payer: Self-pay | Admitting: Physical Therapy

## 2023-09-15 DIAGNOSIS — M542 Cervicalgia: Secondary | ICD-10-CM | POA: Diagnosis not present

## 2023-09-15 DIAGNOSIS — R102 Pelvic and perineal pain: Secondary | ICD-10-CM

## 2023-09-15 DIAGNOSIS — R293 Abnormal posture: Secondary | ICD-10-CM

## 2023-09-15 DIAGNOSIS — N393 Stress incontinence (female) (male): Secondary | ICD-10-CM | POA: Diagnosis not present

## 2023-09-15 DIAGNOSIS — N3281 Overactive bladder: Secondary | ICD-10-CM | POA: Diagnosis not present

## 2023-09-15 DIAGNOSIS — M6289 Other specified disorders of muscle: Secondary | ICD-10-CM | POA: Diagnosis not present

## 2023-09-15 DIAGNOSIS — M6281 Muscle weakness (generalized): Secondary | ICD-10-CM | POA: Diagnosis not present
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
## 2023-09-22 ENCOUNTER — Encounter: Payer: Self-pay | Admitting: Physical Therapy

## 2023-09-22 ENCOUNTER — Encounter: Payer: Medicaid Other | Admitting: Physical Therapy

## 2023-09-22 DIAGNOSIS — N3281 Overactive bladder: Secondary | ICD-10-CM | POA: Diagnosis not present

## 2023-09-22 DIAGNOSIS — N393 Stress incontinence (female) (male): Secondary | ICD-10-CM | POA: Diagnosis not present

## 2023-09-22 DIAGNOSIS — M6281 Muscle weakness (generalized): Secondary | ICD-10-CM

## 2023-09-22 DIAGNOSIS — M542 Cervicalgia: Secondary | ICD-10-CM | POA: Diagnosis not present

## 2023-09-22 DIAGNOSIS — R293 Abnormal posture: Secondary | ICD-10-CM | POA: Diagnosis not present

## 2023-09-22 DIAGNOSIS — M6289 Other specified disorders of muscle: Secondary | ICD-10-CM | POA: Diagnosis not present

## 2023-09-22 DIAGNOSIS — R102 Pelvic and perineal pain: Secondary | ICD-10-CM | POA: Diagnosis not present
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
## 2023-09-27 ENCOUNTER — Telehealth: Payer: Self-pay | Admitting: Physical Therapy

## 2023-09-27 ENCOUNTER — Encounter: Payer: Medicaid Other | Admitting: Physical Therapy

## 2023-09-27 NOTE — Telephone Encounter (Signed)
Called patient with interpreter about her missed appointment today at 15:00. She thought her appointment was on 09/29/23. Therapist ahd an opening on 09/29/23 and put her in the slot.  Eulis Foster, PT @1 /21/25@ 3:23 PM

## 2023-09-29 ENCOUNTER — Encounter: Payer: Self-pay | Admitting: Physical Therapy

## 2023-09-29 ENCOUNTER — Ambulatory Visit: Payer: Medicaid Other | Admitting: Obstetrics and Gynecology

## 2023-10-03 ENCOUNTER — Other Ambulatory Visit: Payer: Self-pay | Admitting: Family Medicine

## 2023-10-03 DIAGNOSIS — I10 Essential (primary) hypertension: Secondary | ICD-10-CM

## 2023-10-06 ENCOUNTER — Encounter: Payer: Medicaid Other | Admitting: Physical Therapy

## 2023-10-06 ENCOUNTER — Encounter: Payer: Self-pay | Admitting: Physical Therapy

## 2023-10-06 DIAGNOSIS — R102 Pelvic and perineal pain: Secondary | ICD-10-CM | POA: Diagnosis not present

## 2023-10-06 DIAGNOSIS — M6289 Other specified disorders of muscle: Secondary | ICD-10-CM | POA: Diagnosis not present

## 2023-10-06 DIAGNOSIS — N3281 Overactive bladder: Secondary | ICD-10-CM | POA: Diagnosis not present

## 2023-10-06 DIAGNOSIS — M6281 Muscle weakness (generalized): Secondary | ICD-10-CM

## 2023-10-06 DIAGNOSIS — R293 Abnormal posture: Secondary | ICD-10-CM | POA: Diagnosis not present

## 2023-10-06 DIAGNOSIS — N393 Stress incontinence (female) (male): Secondary | ICD-10-CM | POA: Diagnosis not present

## 2023-10-06 DIAGNOSIS — M542 Cervicalgia: Secondary | ICD-10-CM | POA: Diagnosis not present
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
## 2023-10-11 ENCOUNTER — Encounter: Payer: Self-pay | Admitting: Physical Therapy

## 2023-10-13 ENCOUNTER — Ambulatory Visit: Payer: Medicaid Other | Admitting: Obstetrics and Gynecology

## 2023-10-13 ENCOUNTER — Encounter: Payer: Medicaid Other | Attending: Obstetrics and Gynecology | Admitting: Physical Therapy

## 2023-10-13 ENCOUNTER — Encounter: Payer: Self-pay | Admitting: Obstetrics and Gynecology

## 2023-10-13 ENCOUNTER — Encounter: Payer: Self-pay | Admitting: Physical Therapy

## 2023-10-13 VITALS — BP 121/80 | HR 81

## 2023-10-13 DIAGNOSIS — R102 Pelvic and perineal pain unspecified side: Secondary | ICD-10-CM

## 2023-10-13 DIAGNOSIS — M6289 Other specified disorders of muscle: Secondary | ICD-10-CM

## 2023-10-13 DIAGNOSIS — N750 Cyst of Bartholin's gland: Secondary | ICD-10-CM | POA: Diagnosis not present

## 2023-10-13 DIAGNOSIS — M6281 Muscle weakness (generalized): Secondary | ICD-10-CM

## 2023-10-13 NOTE — Patient Instructions (Signed)
 You have a small bartholin's cyst in the vagina. If it starts to hurt or get bigger then we can re-examine.
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
## 2023-10-17 ENCOUNTER — Ambulatory Visit: Payer: Medicaid Other | Admitting: Student

## 2023-10-17 NOTE — Progress Notes (Signed)
    SUBJECTIVE:   CHIEF COMPLAINT / HPI:   Sick, body aches, congestion x 5 days Some subjective fevers Some nausea but no vomiting yet Some sick contacts at home  Has been taking some medicine at home but doesn't remember the name Been difficult to fall asleep due to symptoms  HTN on [place] - did not take today  PERTINENT  PMH / PSH: Hypertension  OBJECTIVE:   BP (!) 140/106   Pulse 90   Temp 98.2 F (36.8 C) (Oral)   Ht 5\' 1"  (1.549 m)   Wt 244 lb 4 oz (110.8 kg)   LMP 08/07/2022 (Exact Date)   SpO2 98%   BMI 46.15 kg/m   General: NAD, pleasant, able to participate in exam HEENT: Frontal, maxillary sinuses nontender to palpation bilaterally Cardiac: RRR, no murmurs auscultated Respiratory: CTAB, normal WOB Abdomen: soft, non-tender, non-distended, normoactive bowel sounds Extremities: warm and well perfused, no edema or cyanosis Skin: warm and dry, no rashes noted Neuro: alert, no obvious focal deficits, speech normal Psych: Normal affect and mood  ASSESSMENT/PLAN:   Assessment & Plan Viral upper respiratory tract infection Negative covid/flu. Reassuring exam, afebrile. Hypertensive likely 2/2 acute illness and had not taken her home medication today (advised to take this). Rx naproxen and zyrtec for body aches, cough/sneezing per patient preference - these are on medicaid formulary. Also rx zofran for nausea prn. Discussed supportive care and return precautions. F/u as needed   Vonna Drafts, MD [facility]

## 2023-10-18 ENCOUNTER — Ambulatory Visit: Payer: Medicaid Other | Admitting: Family Medicine

## 2023-10-18 ENCOUNTER — Encounter: Payer: Self-pay | Admitting: Family Medicine

## 2023-10-18 VITALS — BP 140/106 | HR 90 | Temp 98.2°F | Ht 61.0 in | Wt 244.2 lb

## 2023-10-18 DIAGNOSIS — R110 Nausea: Secondary | ICD-10-CM | POA: Diagnosis not present

## 2023-10-18 DIAGNOSIS — R059 Cough, unspecified: Secondary | ICD-10-CM

## 2023-10-18 DIAGNOSIS — R52 Pain, unspecified: Secondary | ICD-10-CM

## 2023-10-18 DIAGNOSIS — J069 Acute upper respiratory infection, unspecified: Secondary | ICD-10-CM

## 2023-10-18 LAB — POC SOFIA 2 FLU + SARS ANTIGEN FIA
Influenza A, POC: NEGATIVE
Influenza B, POC: NEGATIVE
SARS Coronavirus 2 Ag: NEGATIVE

## 2023-10-18 MED ORDER — CETIRIZINE HCL 10 MG PO TABS
10.0000 mg | ORAL_TABLET | Freq: Every day | ORAL | 0 refills | Status: DC | PRN
Start: 2023-10-18 — End: 2024-06-21

## 2023-10-18 MED ORDER — ONDANSETRON 4 MG PO TBDP: 4.0000 mg | ORAL_TABLET | Freq: Three times a day (TID) | ORAL | 0 refills | Status: DC | PRN

## 2023-10-18 MED ORDER — NAPROXEN 500 MG PO TABS: 500.0000 mg | ORAL_TABLET | Freq: Two times a day (BID) | ORAL | 0 refills | Status: DC

## 2023-10-18 NOTE — Patient Instructions (Addendum)
You can try naproxen and Zyrtec for your symptoms  Zofran as needed for nausea   Puede probar naproxeno y Zyrtec para sus sntomas  Zofran, segn sea necesario para las nuseas

## 2023-10-20 ENCOUNTER — Telehealth: Payer: Self-pay | Admitting: Physical Therapy

## 2023-10-20 ENCOUNTER — Encounter: Payer: Self-pay | Admitting: Physical Therapy

## 2023-10-20 NOTE — Telephone Encounter (Signed)
Called patient about her missed appointment today at 8:30. Left a message.  Eulis Foster, PT @2 /13/25@ 8:49 AM

## 2023-10-25 ENCOUNTER — Ambulatory Visit (INDEPENDENT_AMBULATORY_CARE_PROVIDER_SITE_OTHER): Payer: Medicaid Other

## 2023-10-25 ENCOUNTER — Encounter (HOSPITAL_COMMUNITY): Payer: Self-pay

## 2023-10-25 ENCOUNTER — Ambulatory Visit (HOSPITAL_COMMUNITY)
Admission: EM | Admit: 2023-10-25 | Discharge: 2023-10-25 | Disposition: A | Discharge status: Home / Self Care | Payer: Medicaid Other | Attending: Physician Assistant | Admitting: Physician Assistant

## 2023-10-25 DIAGNOSIS — J069 Acute upper respiratory infection, unspecified: Secondary | ICD-10-CM

## 2023-10-25 DIAGNOSIS — R0689 Other abnormalities of breathing: Secondary | ICD-10-CM | POA: Diagnosis not present

## 2023-10-25 DIAGNOSIS — J209 Acute bronchitis, unspecified: Secondary | ICD-10-CM | POA: Diagnosis not present

## 2023-10-25 DIAGNOSIS — R0602 Shortness of breath: Secondary | ICD-10-CM | POA: Diagnosis not present

## 2023-10-25 LAB — POC COVID19/FLU A&B COMBO
Covid Antigen, POC: NEGATIVE
Influenza A Antigen, POC: NEGATIVE
Influenza B Antigen, POC: NEGATIVE

## 2023-10-25 MED ORDER — AZITHROMYCIN 250 MG PO TABS: ORAL_TABLET | ORAL | 0 refills | Status: DC

## 2023-10-25 MED ORDER — ALBUTEROL SULFATE HFA 108 (90 BASE) MCG/ACT IN AERS: 1.0000 | INHALATION_SPRAY | Freq: Four times a day (QID) | RESPIRATORY_TRACT | 0 refills | Status: DC | PRN

## 2023-10-25 MED ORDER — PREDNISONE 20 MG PO TABS: ORAL_TABLET | ORAL | 0 refills | Status: DC
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
## 2023-10-25 NOTE — ED Triage Notes (Signed)
Patient here today with c/o SOB, nasal congestion, headache, body ache, fever, and ST X since last night.

## 2023-10-25 NOTE — Discharge Instructions (Signed)
Your chest x-ray was negative for signs of pneumonia.  At this time I suspect that you might have a mild case of bronchitis.  I have sent in a medication called prednisone for you to take.  This is a steroid medication that should help with your breathing and chest tightness.  I have also sent in a Z-Pak.  This is an antibiotic that also helps with inflammation in the lungs.  I have sent in a script for Prednisone taper to be taken in the morning with breakfast per the instructions on the container Remember that steroids can cause sleeplessness, irritability, increased hunger and elevated glucose levels so be mindful of these side effects. They should lessen as you progress to the lower doses of the taper.  You can also take over-the-counter medications such as Robitussin, Mucinex, Tylenol as needed for symptomatic management in addition to the medications that I have sent in for you.  If it anytime you start to develop increased shortness of breath, fevers that are not responding to medication, chest pain, cough with phlegm, lightheadedness, lethargy, passing out please go to the emergency room for further evaluation management.  Please reach out to your primary care doctor for refills of your blood pressure medications.

## 2023-10-27 ENCOUNTER — Encounter: Payer: Self-pay | Admitting: Physical Therapy

## 2023-11-03 NOTE — Progress Notes (Deleted)
    SUBJECTIVE:   CHIEF COMPLAINT / HPI:   *** Seen in ED 10/25/2023 and diagnosed with bronchitis, treated with albuterol, azithromycin, prednisone  PERTINENT  PMH / PSH: ***  OBJECTIVE:   LMP 08/07/2022 (Exact Date)  ***  General: NAD, pleasant, able to participate in exam Cardiac: RRR, no murmurs. Respiratory: CTAB, normal effort, No wheezes, rales or rhonchi Abdomen: Bowel sounds present, nontender, nondistended, no hepatosplenomegaly. Extremities: no edema or cyanosis. Skin: warm and dry, no rashes noted Neuro: alert, no obvious focal deficits Psych: Normal affect and mood  ASSESSMENT/PLAN:   No problem-specific Assessment & Plan notes found for this encounter.     Dr. Erick Alley, DO [facility]    {    This will disappear when note is signed, click to select method of visit    :1}

## 2023-11-04 ENCOUNTER — Ambulatory Visit: Payer: Medicaid Other | Admitting: Student

## 2023-11-07 ENCOUNTER — Encounter: Payer: Self-pay | Admitting: Family Medicine

## 2023-11-07 ENCOUNTER — Other Ambulatory Visit: Payer: Self-pay

## 2023-11-07 ENCOUNTER — Ambulatory Visit: Payer: Medicaid Other | Admitting: Family Medicine

## 2023-11-07 VITALS — BP 127/93 | HR 88 | Temp 98.4°F | Ht 61.0 in | Wt 245.8 lb

## 2023-11-07 DIAGNOSIS — I10 Essential (primary) hypertension: Secondary | ICD-10-CM

## 2023-11-07 DIAGNOSIS — J069 Acute upper respiratory infection, unspecified: Secondary | ICD-10-CM

## 2023-11-07 MED ORDER — AMLODIPINE-OLMESARTAN 5-20 MG PO TABS
1.0000 | ORAL_TABLET | Freq: Every day | ORAL | 0 refills | Status: DC
Start: 2023-11-07 — End: 2023-12-12

## 2023-11-07 NOTE — Progress Notes (Unsigned)
    SUBJECTIVE:   CHIEF COMPLAINT / HPI:   *** Seen in clinic 2/11 for v Seen in ED 2/18 treated with albuterol, azithromycin, prednisone   PERTINENT  PMH / PSH: ***  OBJECTIVE:   LMP 08/07/2022 (Exact Date)   ***  ASSESSMENT/PLAN:   No problem-specific Assessment & Plan notes found for this encounter.     Lincoln Brigham, MD [facility]

## 2023-11-07 NOTE — Patient Instructions (Addendum)
 Me alegro de verte hoy. Gracias por venir.  Lo que comentamos hoy:  Es muy probable que tengas una infeccin viral de las vas respiratorias superiores (tambin conocida como resfriado). Tu cuerpo combatir esta infeccin de forma natural durante los prximos 7 a [address]. Es posible que tengas tos persistente durante 6 a 8 semanas despus de que la infeccin haya desaparecido. Esto es normal y [place] a [employer] residuos de los pulmones.   -Bebe mucha agua y lquidos para mantenerte hidratado. Tu orina debe ser transparente o de color amarillo claro. -Toma Tylenol o ibuprofeno segn sea necesario para fiebres de 100,3 F o ms. -Puedes tomar 1 cucharada de miel de abejas 4 veces al da. Esto puede ayudar a Conservation officer, nature.  Busca ms atencin mdica si: - tienes problemas para respirar - vomitas sin control - no puedes beber lo suficiente para mantenerte hidratado - tienes fiebres de 100,3 F o ms durante 3 das seguidos    Good to see you today - Thank you for coming in  Things we discussed today:  You most likely have a viral upper respiratory infection (aka a cold). Your body will naturally fight off this infection over the next 7-14 days. You may have a lingering cough for 6-8 weeks after the infection is gone, this is normal and helps to clear debris out of your lungs.   -Drink plenty of water and fluids to stay hydrated.  Your urine should be clear or light yellow. -Take Tylenol or ibuprofen as needed for fevers of the 100.3 Fahrenheit or higher -You can take 1 tablespoon of bees honey 4 times a day.  This can help soothe your throat.  Please seek further medical attention if you: - have trouble breathing - are vomiting uncontrollably - are unable to drink enough to stay hydrated - have fevers of 100.92F or higher for 3 days in a row

## 2023-11-07 NOTE — Progress Notes (Unsigned)
    SUBJECTIVE:   CHIEF COMPLAINT / HPI:   SP is a 43yo F w/ hx of HTN, GERD that p/f flu-like symptoms and leg shaking. - Started with a cold, flu, headache, sore throat, body aches about a month ago - Seen in clinic 2/11 for viral URI, given supportive care - Seen in UC 2/18, dx w/ bronchitis and treated with albuterol, azithromycin, prednisone - Has been slowly improving until 2 days ago, started to feel sicker again - Mild cough, mild SOB but this is improving. Having diarrhea too - Eating and drinking normally.  - Reports her throat started feeling sore again yesterday - Reports her legs are shaking and trembling when she tries to walk her dog. She went for an hour long walk and came back very sore in her calves. - Feels like they shake because she's feeling weak, not because it hurts. No trouble standing.  - Feels like the shaking is getting slightly better, but still not at baseline. - Not taking meds currently for these symptoms - No vomiting - Denies fever  OBJECTIVE:   BP (!) 127/93   Pulse 88   Temp 98.4 F (36.9 C) (Oral)   Ht 5\' 1"  (1.549 m)   Wt 245 lb 12.8 oz (111.5 kg)   LMP 08/07/2022 (Exact Date)   SpO2 99%   BMI 46.44 kg/m   General: Alert, pleasant well-appearing woman speaking comfortably in full sentences. NAD. HEENT: NCAT. MMM. Oropharynx clear, nonerythematous. Nasal turbinates nonedematous and nonerythematous.  CV: RRR, no murmurs. Cap refill <2. Resp: CTAB, no wheezing or crackles. Normal WOB on RA.  Abm: Soft, nontender, nondistended. BS present. Ext: No tenderness of BL LE, no edema or skin changes. Normal gait.  Skin: Warm, well perfused    ASSESSMENT/PLAN:   Assessment & Plan Viral URI Differential includes myopathy and deconditioning due to recent viral illness, with potential secondary viral iullness starting in past 2 days. Low c/f PNA given normal lung exam and VSS. Low c/f myositis given no tenderness on exam.  -Advised to continue  physical activity, but to start at lower intensity than usual and slowly uptitrate as her body recovers - Goal of 16oz water bottle ever 1-2 hrs while awake -Counseled on supportive management and return precautions    Lincoln Brigham, MD [facility]

## 2023-11-16 ENCOUNTER — Telehealth: Payer: Self-pay

## 2023-11-16 NOTE — Telephone Encounter (Signed)
 Patient's daughter calls nurse line requesting medication for car sickness/dizziness.   Reports that patient is traveling to [place] this Monday, 11/21/23. This will be a 14 hour bus ride and patient gets very dizzy, nauseous and will have episodes of emesis.   Daughter reports that patient has tried OTC medication for this issue, however, this does not work.   They are requesting a prescription for medication to help ease these symptoms.   Will forward request to PCP.   Veronda Prude, RN

## 2023-11-17 ENCOUNTER — Encounter: Payer: Medicaid Other | Attending: Obstetrics and Gynecology | Admitting: Physical Therapy

## 2023-11-17 NOTE — Telephone Encounter (Signed)
 Called patient's daughter and LVM asking her to return call to office.   Veronda Prude, RN

## 2023-11-18 ENCOUNTER — Telehealth: Admitting: Student

## 2023-11-18 DIAGNOSIS — T753XXA Motion sickness, initial encounter: Secondary | ICD-10-CM

## 2023-11-18 MED ORDER — SCOPOLAMINE 1 MG/3DAYS TD PT72: 1.0000 | MEDICATED_PATCH | TRANSDERMAL | 0 refills | Status: DC

## 2023-11-18 NOTE — Progress Notes (Signed)
 [facility] Telemedicine Visit  Patient consented to have virtual visit and was identified by name and date of birth. Method of visit: Telephone  Encounter participants: Patient: Deborah Chandler - located at home Provider: Levin Erp - located at [facility]  Interpreter present during encounter  Chief Complaint: motion sickness  HPI: Interpreter present during encounter  Phone call 3/12  Patient's daughter calls nurse line requesting medication for car sickness/dizziness.  Reports that patient is traveling to [place] this Monday, 11/21/23. This will be a 14 hour bus ride and patient gets very dizzy, nauseous and will have episodes of emesis.  Daughter reports that patient has tried OTC medication for this issue, however, this does not work.  They are requesting a prescription for medication to help ease these symptoms.   States she has tried OTC pills that do not work with traveling in car Requests I send OTC medicine Denies any hx of glaucoma, urinary retention  ROS: per HPI  Pertinent PMHx: GERD  Exam:  LMP 08/07/2022 (Exact Date)   Respiratory: speaking full sentences  Assessment/Plan: Assessment & Plan Motion sickness, initial encounter Requests Rx for motion sickness. Discussed potential side effects including sedation and dry mouth.  - Prescribe scopolamine patch to be applied behind ears at least four hours before trip q72h -Advised on proper handling to prevent blurred vision. - No glaucoma, but instructed to seek urgent care if develops significant eye pain. - Advise not to touch patch and then eyes to avoid blurred vision.   Time spent during visit with patient: 30 minutes

## 2023-12-01 ENCOUNTER — Telehealth: Payer: Self-pay | Admitting: Physical Therapy

## 2023-12-01 ENCOUNTER — Encounter: Payer: Medicaid Other | Attending: Obstetrics and Gynecology | Admitting: Physical Therapy

## 2023-12-01 DIAGNOSIS — M6281 Muscle weakness (generalized): Secondary | ICD-10-CM | POA: Insufficient documentation

## 2023-12-01 DIAGNOSIS — R102 Pelvic and perineal pain: Secondary | ICD-10-CM | POA: Insufficient documentation

## 2023-12-01 NOTE — Telephone Encounter (Signed)
 Called patient with interpreter about the missed appointment today at 15:00. Patient reports she forgot about her appointment. Therapist informed patient if she misses the next appointment she will be discharge.  Eulis Foster, PT @3 /27/25@ 4:16 PM

## 2023-12-08 ENCOUNTER — Encounter: Payer: Self-pay | Admitting: Physical Therapy

## 2023-12-08 ENCOUNTER — Encounter: Payer: Medicaid Other | Attending: Obstetrics and Gynecology | Admitting: Physical Therapy

## 2023-12-08 DIAGNOSIS — R102 Pelvic and perineal pain: Secondary | ICD-10-CM | POA: Insufficient documentation

## 2023-12-08 DIAGNOSIS — M6281 Muscle weakness (generalized): Secondary | ICD-10-CM | POA: Insufficient documentation
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
## 2023-12-12 ENCOUNTER — Other Ambulatory Visit: Payer: Self-pay

## 2023-12-12 DIAGNOSIS — I10 Essential (primary) hypertension: Secondary | ICD-10-CM

## 2023-12-13 ENCOUNTER — Other Ambulatory Visit: Payer: Self-pay | Admitting: Student

## 2023-12-13 DIAGNOSIS — I10 Essential (primary) hypertension: Secondary | ICD-10-CM

## 2023-12-13 MED ORDER — AMLODIPINE-OLMESARTAN 5-20 MG PO TABS: 1.0000 | ORAL_TABLET | Freq: Every day | ORAL | 0 refills | Status: DC

## 2023-12-13 NOTE — Progress Notes (Signed)
 Refilled Azor. On chart review, has not had BMP since starting it back in October. Future bmp ordered and I asked front office staff to call and schedule pt for lab only apt.

## 2023-12-15 ENCOUNTER — Encounter: Payer: Medicaid Other | Admitting: Physical Therapy

## 2023-12-15 ENCOUNTER — Encounter: Payer: Self-pay | Admitting: Physical Therapy

## 2023-12-15 DIAGNOSIS — R102 Pelvic and perineal pain: Secondary | ICD-10-CM | POA: Diagnosis not present

## 2023-12-15 DIAGNOSIS — M6281 Muscle weakness (generalized): Secondary | ICD-10-CM | POA: Diagnosis not present
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
## 2023-12-15 NOTE — Patient Instructions (Signed)
 Moisturizers They are used in the vagina to hydrate the mucous membrane that make up the vaginal canal. Designed to keep a more normal acid balance (ph) Use 3-4 times per week at bedtime    Creams to use externally on the Vulva area  Coconut or olive oil  Things to avoid in the vaginal area Do not use things to irritate the vulvar area No lotions just specialized creams for the vulva area- Neogyn, V-magic,  No soaps; can use Aveeno or Calendula cleanser, unscented Dove if needed. Must be gentle No deodorants No douches Good to sleep without underwear to let the vaginal area to air out No scrubbing: spread the lips to let warm water rinse over labias and pat dry  Eulis Foster, PT [facility] [address], [address] [place], [place] [address] W: [phone] [email or web address]

## 2023-12-16 ENCOUNTER — Other Ambulatory Visit

## 2023-12-16 DIAGNOSIS — I10 Essential (primary) hypertension: Secondary | ICD-10-CM

## 2023-12-17 LAB — BASIC METABOLIC PANEL WITH GFR
BUN/Creatinine Ratio: 15 (ref 9–23)
BUN: 10 mg/dL (ref 6–24)
CO2: 22 mmol/L (ref 20–29)
Calcium: 9.3 mg/dL (ref 8.7–10.2)
Chloride: 101 mmol/L (ref 96–106)
Creatinine, Ser: 0.67 mg/dL (ref 0.57–1.00)
Glucose: 143 mg/dL — ABNORMAL HIGH (ref 70–99)
Potassium: 4.5 mmol/L (ref 3.5–5.2)
Sodium: 140 mmol/L (ref 134–144)
eGFR: 113 mL/min/{1.73_m2} (ref >59)

## 2023-12-18 ENCOUNTER — Encounter: Payer: Self-pay | Admitting: Student

## 2023-12-20 ENCOUNTER — Encounter: Payer: Self-pay | Admitting: Physical Therapy

## 2023-12-27 ENCOUNTER — Ambulatory Visit (INDEPENDENT_AMBULATORY_CARE_PROVIDER_SITE_OTHER): Admitting: Student

## 2023-12-27 DIAGNOSIS — L989 Disorder of the skin and subcutaneous tissue, unspecified: Secondary | ICD-10-CM | POA: Diagnosis not present

## 2023-12-27 NOTE — Assessment & Plan Note (Addendum)
 BMI 46.75 kg/m.  Morbidly obese on exam. - Counseled patient on diet and and exercise.

## 2023-12-27 NOTE — Progress Notes (Signed)
    SUBJECTIVE:   CHIEF COMPLAINT / HPI:   43 year old female presents with a lesion on their middle finger that appeared three days ago. They woke up with the lesion and are unsure of its origin. The lesion initially presented as a bump, which they squeezed, causing a small amount of bleeding and the formation of smaller bumps. The lesion is not currently painful. They have been applying menthol to the lesion, but report no improvement.  PERTINENT  PMH / PSH: Reviewed  OBJECTIVE:   BP 103/65   Pulse 92   Wt 247 lb 6.4 oz (112.2 kg)   LMP 08/07/2022 (Exact Date)   SpO2 96%   BMI 46.75 kg/m    Physical Exam General: Morbidly obese, well appearing, NAD Cardiovascular: Regular rate, well-perfused Respiratory: Normal WOB on RA Extremities: Healing wound on the right middle finger.  Erythema and mild tenderness with palpation.   ASSESSMENT/PLAN:   Morbid obesity (HCC) BMI 46.75 kg/m.  Morbidly obese on exam. - Counseled patient on diet and and exercise.  Finger lesion with bumps Non-painful, non-infected finger lesion with minor bleeding and bump formation. - Leave lesion open for healing. - Apply Vaseline for moisture. - Consider OTC Bacitracin cream. - Advise return if bumps reappear and avoid self-expression.    Goble Last, MD [facility]

## 2023-12-27 NOTE — Patient Instructions (Addendum)
 Un placer conocerte hoy.  [place], tu dedo no parece infectado.  Sin embargo, te recomiendo usar vaselina o Primary school teacher (antibitico de Sales promotion account executive) o usar algn producto sobre la lesin.  Intenta no cubrirla con curitas.  Puedes tomar Tylenol  o ibuprofeno para el dolor.  Si an tienes ms bultos o reaparece el fornculo, devulvelo sin abrir.   Pleasure  to meet you today.  Good hand your finger does not look infected.  However I recommend use of Vaseline or over-the-counter bacitracin antibiotic or use means over the injury.  Try not to cover it with Band-Aid.  You can do Tylenol  or ibuprofen  for pain.  If you still have any more of the bumps/ boil reappear please return without opening it

## 2024-01-08 ENCOUNTER — Emergency Department (HOSPITAL_COMMUNITY)

## 2024-01-08 ENCOUNTER — Emergency Department (HOSPITAL_COMMUNITY)
Admission: EM | Admit: 2024-01-08 | Discharge: 2024-01-08 | Disposition: A | Discharge status: Home / Self Care | Attending: Emergency Medicine | Admitting: Emergency Medicine

## 2024-01-08 ENCOUNTER — Encounter (HOSPITAL_COMMUNITY): Payer: Self-pay | Admitting: Emergency Medicine

## 2024-01-08 DIAGNOSIS — M545 Low back pain, unspecified: Secondary | ICD-10-CM | POA: Diagnosis not present

## 2024-01-08 DIAGNOSIS — W19XXXA Unspecified fall, initial encounter: Secondary | ICD-10-CM

## 2024-01-08 DIAGNOSIS — Z91040 Latex allergy status: Secondary | ICD-10-CM | POA: Diagnosis not present

## 2024-01-08 DIAGNOSIS — S39012A Strain of muscle, fascia and tendon of lower back, initial encounter: Secondary | ICD-10-CM | POA: Insufficient documentation

## 2024-01-08 DIAGNOSIS — R102 Pelvic and perineal pain: Secondary | ICD-10-CM | POA: Diagnosis not present

## 2024-01-08 DIAGNOSIS — S8002XA Contusion of left knee, initial encounter: Secondary | ICD-10-CM | POA: Diagnosis not present

## 2024-01-08 DIAGNOSIS — I10 Essential (primary) hypertension: Secondary | ICD-10-CM | POA: Diagnosis not present

## 2024-01-08 DIAGNOSIS — M79652 Pain in left thigh: Secondary | ICD-10-CM | POA: Diagnosis not present

## 2024-01-08 DIAGNOSIS — M549 Dorsalgia, unspecified: Secondary | ICD-10-CM | POA: Diagnosis present

## 2024-01-08 DIAGNOSIS — Z79899 Other long term (current) drug therapy: Secondary | ICD-10-CM | POA: Insufficient documentation

## 2024-01-08 DIAGNOSIS — M25562 Pain in left knee: Secondary | ICD-10-CM | POA: Diagnosis not present

## 2024-01-08 DIAGNOSIS — W010XXA Fall on same level from slipping, tripping and stumbling without subsequent striking against object, initial encounter: Secondary | ICD-10-CM | POA: Insufficient documentation

## 2024-01-08 DIAGNOSIS — S7012XA Contusion of left thigh, initial encounter: Secondary | ICD-10-CM | POA: Diagnosis not present

## 2024-01-08 DIAGNOSIS — M79605 Pain in left leg: Secondary | ICD-10-CM | POA: Diagnosis not present

## 2024-01-08 LAB — URINALYSIS, ROUTINE W REFLEX MICROSCOPIC
Bilirubin Urine: NEGATIVE
Glucose, UA: NEGATIVE mg/dL
Hgb urine dipstick: NEGATIVE
Ketones, ur: NEGATIVE mg/dL
Leukocytes,Ua: NEGATIVE
Nitrite: NEGATIVE
Protein, ur: NEGATIVE mg/dL
Specific Gravity, Urine: 1.005 (ref 1.005–1.030)
pH: 5 (ref 5.0–8.0)

## 2024-01-08 LAB — PREGNANCY, URINE: Preg Test, Ur: NEGATIVE

## 2024-01-08 MED ORDER — HYDROCODONE-ACETAMINOPHEN 5-325 MG PO TABS
2.0000 | ORAL_TABLET | Freq: Once | ORAL | Status: AC
  Administered 2024-01-08: 2 via ORAL
  Filled 2024-01-08: qty 2

## 2024-01-08 MED ORDER — CYCLOBENZAPRINE HCL 10 MG PO TABS: 10.0000 mg | ORAL_TABLET | Freq: Three times a day (TID) | ORAL | 0 refills | Status: DC | PRN

## 2024-01-08 MED ORDER — IBUPROFEN 800 MG PO TABS: 800.0000 mg | ORAL_TABLET | Freq: Three times a day (TID) | ORAL | 0 refills | Status: DC

## 2024-01-08 MED ORDER — CYCLOBENZAPRINE HCL 10 MG PO TABS
5.0000 mg | ORAL_TABLET | Freq: Once | ORAL | Status: AC
  Administered 2024-01-08: 5 mg via ORAL
  Filled 2024-01-08: qty 1

## 2024-01-08 MED ORDER — TRAMADOL HCL 50 MG PO TABS: 50.0000 mg | ORAL_TABLET | Freq: Four times a day (QID) | ORAL | 0 refills | Status: DC | PRN

## 2024-01-08 NOTE — Discharge Instructions (Signed)
 As we discussed your x-rays did not show any fracture  You likely have muscle strain  I recommend that you take Motrin  for pain and Flexeril  for muscle spasm.  I have also prescribed tramadol  for severe pain  You are likely going to be stiff and sore for about a week  Please see your doctor for follow-up  Please use crutches to help you walk  Return to ER if you have severe back pain or hip pain or trouble walking

## 2024-01-08 NOTE — ED Triage Notes (Signed)
 43 y/o female comes in c/o left lower flank pain and lower back pain after a mechanical fall at [facility]. Pt denies hitting her head or any LOC. No thinners

## 2024-01-08 NOTE — ED Provider Notes (Signed)
 [place] EMERGENCY DEPARTMENT AT [facility] Provider Note   CSN: [id number] Arrival date & time: 01/08/24  1955     History  Chief Complaint  Patient presents with   Deborah Chandler is a 43 y.o. female history of hypertension, here presenting with fall.  Patient states that she is at [facility] and the floor was slippery with some oil and she slipped and her legs split apart and she landed on her left side.  She states that she has left-sided back pain and also hip pain and knee pain.  Denies head injury.  The history is provided by the patient.       Home Medications Prior to Admission medications   Medication Sig Start Date End Date Taking? Authorizing Provider  albuterol  (VENTOLIN  HFA) 108 (90 Base) MCG/ACT inhaler Inhale 1-2 puffs into the lungs every 6 (six) hours as needed for wheezing or shortness of breath. 10/25/23   Mecum, Erin E, PA-C  amLODipine -olmesartan  (AZOR ) 5-20 MG tablet Take 1 tablet by mouth daily. Needs apt before refill 12/13/23   Glenn Lange, DO  azithromycin  (ZITHROMAX ) 250 MG tablet Take 500mg  PO daily x1d and then 250mg  daily x4 days 10/25/23   Mecum, Erin E, PA-C  cetirizine  (ZYRTEC  ALLERGY) 10 MG tablet Take 1 tablet (10 mg total) by mouth daily as needed for allergies or rhinitis. 10/18/23   Edison Gore, MD  dicyclomine  (BENTYL ) 20 MG tablet Take 1 tablet (20 mg total) by mouth 2 (two) times daily. 05/28/23   Raspet, Erin K, PA-C  mupirocin  ointment (BACTROBAN ) 2 % Apply 1 Application topically daily. 05/28/23   Raspet, Erin K, PA-C  naproxen  (NAPROSYN ) 500 MG tablet Take 1 tablet (500 mg total) by mouth 2 (two) times daily with a meal. 10/18/23   Edison Gore, MD  ondansetron  (ZOFRAN -ODT) 4 MG disintegrating tablet Take 1 tablet (4 mg total) by mouth every 8 (eight) hours as needed for nausea. 10/18/23   Edison Gore, MD  predniSONE  (DELTASONE ) 20 MG tablet Take 60mg  PO daily x 2 days, then40mg  PO daily x 2 days, then 20mg  PO daily x  3 days 10/25/23   Mecum, Erin E, PA-C  scopolamine  (TRANSDERM-SCOP) 1 MG/3DAYS Place 1 patch (1.5 mg total) onto the skin every 3 (three) days. 11/18/23   Genora Kidd, MD  solifenacin  (VESICARE ) 10 MG tablet Take 1 tablet (10 mg total) by mouth daily. 06/02/23   Zuleta, Kaitlin G, NP      Allergies    Flagyl  [place].Chick ] and Latex    Review of Systems   Review of Systems  Musculoskeletal:  Positive for back pain.       Left hip and knee pain  All other systems reviewed and are negative.   Physical Exam Updated Vital Signs LMP 08/07/2022 (Exact Date)  Physical Exam Vitals and nursing note reviewed.  Constitutional:      Comments: Uncomfortable  HENT:     Head: Normocephalic and atraumatic.     Nose: Nose normal.     Mouth/Throat:     Mouth: Mucous membranes are moist.  Eyes:     Extraocular Movements: Extraocular movements intact.     Pupils: Pupils are equal, round, and reactive to light.  Cardiovascular:     Rate and Rhythm: Normal rate and regular rhythm.     Pulses: Normal pulses.     Heart sounds: Normal heart sounds.  Pulmonary:     Effort: Pulmonary effort is normal.  Breath sounds: Normal breath sounds.  Abdominal:     General: Abdomen is flat.     Palpations: Abdomen is soft.  Musculoskeletal:     Cervical back: Normal range of motion and neck supple.     Comments: Left paralumbar tenderness.  Patient also has mild left SI joint and pelvis tenderness.  Has diffuse tenderness of the left thigh and some bruising of the left knee.  No tib-fib tenderness on the left.  No other extremity trauma.  Skin:    Capillary Refill: Capillary refill takes less than 2 seconds.  Neurological:     General: No focal deficit present.     Mental Status: She is alert and oriented to person, place, and time.     Comments: Patient is able to ambulate.  Patient has normal strength bilateral lower extremities and upper extremities  Psychiatric:        Mood and Affect: Mood  normal.        Behavior: Behavior normal.     ED Results / Procedures / Treatments   Labs (all labs ordered are listed, but only abnormal results are displayed) Labs Reviewed  URINALYSIS, ROUTINE W REFLEX MICROSCOPIC - Abnormal; Notable for the following components:      Result Value   Color, Urine STRAW (*)    All other components within normal limits  PREGNANCY, URINE    EKG None  Radiology DG Lumbar Spine Complete Result Date: 01/08/2024 CLINICAL DATA:  Recent fall with low back pain, initial encounter EXAM: LUMBAR SPINE - COMPLETE 4+ VIEW COMPARISON:  None Available. FINDINGS: Five lumbar type vertebral bodies are well visualized. Vertebral body height is well maintained. No pars defects are noted. No anterolisthesis is seen. No soft tissue abnormality is noted. IMPRESSION: No acute abnormality noted. Electronically Signed   By: Violeta Grey M.D.   On: 01/08/2024 22:13   DG Knee Complete 4 Views Left Result Date: 01/08/2024 CLINICAL DATA:  Recent fall with left knee pain, initial encounter EXAM: LEFT KNEE - COMPLETE 4+ VIEW COMPARISON:  None Available. FINDINGS: No evidence of fracture, dislocation, or joint effusion. No evidence of arthropathy or other focal bone abnormality. Soft tissues are unremarkable. IMPRESSION: No acute abnormality noted. Electronically Signed   By: Violeta Grey M.D.   On: 01/08/2024 22:12   DG Femur Min 2 Views Left Result Date: 01/08/2024 CLINICAL DATA:  Recent fall with left-sided pain, initial encounter EXAM: LEFT FEMUR 2 VIEWS COMPARISON:  None Available. FINDINGS: There is no evidence of fracture or other focal bone lesions. Soft tissues are unremarkable. IMPRESSION: No acute abnormality noted. Electronically Signed   By: Violeta Grey M.D.   On: 01/08/2024 22:12   DG Pelvis 1-2 Views Result Date: 01/08/2024 CLINICAL DATA:  Recent fall with pelvic pain, initial encounter EXAM: PELVIS - 1 VIEW COMPARISON:  None Available. FINDINGS: Pelvic ring is intact. No  acute fracture or dislocation is noted. No soft tissue abnormality is seen. IMPRESSION: Acute abnormality noted. Electronically Signed   By: Violeta Grey M.D.   On: 01/08/2024 22:11    Procedures Procedures    Medications Ordered in ED Medications  cyclobenzaprine  (FLEXERIL ) tablet 5 mg (5 mg Oral Given 01/08/24 2149)  HYDROcodone -acetaminophen  (NORCO/VICODIN) 5-325 MG per tablet 2 tablet (2 tablets Oral Given 01/08/24 2149)    ED Course/ Medical Decision Making/ A&P  Medical Decision Making Deborah Chandler is a 43 y.o. female here presenting with fall with left hip and knee and thigh pain.  I think likely muscle strain.  Will give muscle relaxant and pain medicine.  Will also get x-rays of the lumbar spine and pelvis and femur and knee.  10:31 PM Reviewed patient's imaging and there is no fractures.  Patient able to bear weight on the leg.  Will discharge home with pain medicine and muscle relaxant.   Problems Addressed: Back strain, initial encounter: acute illness or injury Contusion of left thigh, initial encounter: acute illness or injury Fall, initial encounter: acute illness or injury  Amount and/or Complexity of Data Reviewed Labs: ordered. Decision-making details documented in ED Course. Radiology: ordered and independent interpretation performed. Decision-making details documented in ED Course.  Risk Prescription drug management.    Final Clinical Impression(s) / ED Diagnoses Final diagnoses:  None    Rx / DC Orders ED Discharge Orders     None         Dalene Duck, MD 01/08/24 2242

## 2024-01-12 ENCOUNTER — Ambulatory Visit: Payer: Medicaid Other | Admitting: Obstetrics and Gynecology

## 2024-01-12 ENCOUNTER — Encounter: Payer: Self-pay | Admitting: Obstetrics and Gynecology

## 2024-01-12 VITALS — BP 133/78 | HR 85

## 2024-01-12 DIAGNOSIS — N3941 Urge incontinence: Secondary | ICD-10-CM

## 2024-01-12 DIAGNOSIS — N3281 Overactive bladder: Secondary | ICD-10-CM

## 2024-01-12 MED ORDER — VIBEGRON 75 MG PO TABS: 75.0000 mg | ORAL_TABLET | Freq: Every day | ORAL | 5 refills | Status: DC

## 2024-01-12 NOTE — Patient Instructions (Addendum)
 Today we talked about ways to manage bladder urgency such as altering your diet to avoid irritative beverages and foods (bladder diet) as well as attempting to decrease stress and other exacerbating factors.  You can also chew a plain Tums 1-3 times per day to make your urine less acidic, especially if you have eating/drinking acidic things.    The Most Bothersome Foods* The Least Bothersome Foods*  Coffee - Regular & Decaf Tea - caffeinated Carbonated beverages - cola, non-colas, diet & caffeine-free Alcohols - Beer, Red Wine, White Wine, [address] - Grapefruit, [place], Orange, [employer] - Cranberry, Grapefruit, Orange, Pineapple Vegetables - Tomato & Tomato Products Flavor Enhancers - Hot peppers, Spicy foods, Chili, Horseradish, Vinegar, Monosodium glutamate (MSG) Artificial Sweeteners - NutraSweet, Sweet 'N Low, Equal (sweetener), Saccharin Ethnic foods - [place], [place], [place] food [employer] - low-fat & whole Fruits - Bananas, Blueberries, Honeydew melon, Pears, Raisins, Watermelon Vegetables - Broccoli, [address] Sprouts, [place], Carrots, Cauliflower, [place], Cucumber, Mushrooms, Peas, Radishes, Squash, Zucchini, White potatoes, Sweet potatoes & yams Poultry - Chicken, Eggs, [place], [employer] - Beef, Diplomatic Services operational officer, Lamb Seafood - Shrimp, [place] fish, Salmon Grains - Oat, Rice Snacks - Pretzels, Popcorn  *Theodosia Fishman et al. Diet and its role in interstitial cystitis/bladder pain syndrome (IC/BPS) and comorbid conditions. BJU [employer]. BJU Int. 2012 Jan 11.    Stop the vesicare  and start Gemtesa 75mg  daily for overactive bladder.

## 2024-01-12 NOTE — Progress Notes (Signed)
 [facility] Return Visit  SUBJECTIVE  History of Present Illness:  Due to language barrier, an interpreter was present during the history-taking and subsequent discussion (and for part of the physical exam) with this patient.   Bradford Heid is a 43 y.o. female seen in follow-up for OAB and UUI. Plan at last visit was continue vesicare  10mg  daily.  Patient reports her UUI is not under control and she is having significant leakage trying to make it to the bathroom.     Past Medical History: Patient  has a past medical history of Abdominal pain in female patient (05/03/2016), Abnormal uterine bleeding (AUB) (2023), Anemia (2019), Candidal intertrigo (02/04/2016), Cholelithiasis (09/10/2021), Dysmenorrhea (03/20/2015), Finger injury, right, subsequent encounter (07/19/2018), Gastritis, H. pylori infection, Heat exhaustion (04/15/2021), HTN (hypertension), Ovarian cyst, Pain in joint of right shoulder (07/31/2018), Pneumonia (02/26/2017), and Uterovaginal prolapse.   Past Surgical History: She  has a past surgical history that includes Wisdom tooth extraction; Tubal ligation; Cholecystectomy (N/A, 12/21/2021); Vaginal hysterectomy (N/A, 08/09/2022); Vaginal prolapse repair (N/A, 08/09/2022); Anterior and posterior repair (N/A, 08/09/2022); Bladder suspension (N/A, 08/09/2022); and Cystoscopy (N/A, 08/09/2022).   Medications: She has a current medication list which includes the following prescription(s): albuterol , amlodipine -olmesartan , azithromycin , cetirizine , cyclobenzaprine , dicyclomine , ibuprofen , mupirocin  ointment, naproxen , ondansetron , prednisone , scopolamine , tramadol , and vibegron.   Allergies: Patient is allergic to flagyl  [metronidazole ] and latex.   Social History: Patient  reports that she has never smoked. She has never used smokeless tobacco. She reports that she does not drink alcohol and does not use drugs.     OBJECTIVE     Physical Exam: Vitals:    01/12/24 1431  BP: 133/78  Pulse: 85   Gen: No apparent distress, A&O x 3.  Detailed Urogynecologic Evaluation:  Deferred.    ASSESSMENT AND PLAN    Ms. Davene Donia is a 43 y.o. with:  1. OAB (overactive bladder)   2. Urge incontinence    Patient to continue doing pelvic floor PT.  Patient has never tried a non-anticholinergic medication for her bladder. She has mildly elevated blood pressure so will start with Vibegron 75mg  daily and see if this is helpful for her bladder.   Patient to return in 6 weeks for medication follow up or sooner if needed.    Dushawn Pusey G Asencion Guisinger, NP

## 2024-01-16 ENCOUNTER — Encounter: Payer: Self-pay | Admitting: Family Medicine

## 2024-01-16 ENCOUNTER — Ambulatory Visit: Admitting: Family Medicine

## 2024-01-16 VITALS — BP 129/80 | HR 93 | Ht 61.0 in | Wt 243.1 lb

## 2024-01-16 DIAGNOSIS — M25562 Pain in left knee: Secondary | ICD-10-CM

## 2024-01-16 DIAGNOSIS — M7072 Other bursitis of hip, left hip: Secondary | ICD-10-CM | POA: Diagnosis not present

## 2024-01-16 MED ORDER — MELOXICAM 15 MG PO TABS: 15.0000 mg | ORAL_TABLET | Freq: Every day | ORAL | 0 refills | Status: AC

## 2024-01-16 NOTE — Patient Instructions (Addendum)
  Fue un placer verla hoy! Deborah Chandler por elegir [facility] para su atencin primaria. Deborah Chandler fue atendida por dolor en la rodilla izquierda y la cadera despus de una cada.  Hoy abordamos: 1. Dolor en la cadera izquierda y la rodilla; el dolor probablemente se deba a la lesin Art therapist. Le he recetado meloxicam  15 mg al [employer]. Tambin se le deriv a fisioterapia para ayudarle con su dolor y movilidad. 2. Si el dolor persiste, tendr Earle Glatter de seguimiento en 1 o 2 semanas con el Dr. Irby Mannan o el Dr. Baby Bolt para una posible ecografa.  Debe regresar a nuestra clnica en aproximadamente 2 semanas (alrededor del 26/01/2024). Por favor, llegue 15 minutos antes de su cita para garantizar un registro sin problemas. Agradecemos su esfuerzo.  Gracias por permitirme participar en su atencin mdica. Clyda Dark, DO 08/10/2024, 17:02 PGY-1, [facility]  It was great to see you today! Thank you for choosing [facility] for your primary care. Deborah Chandler was seen for L knee and hip pain after fall.  Today we addressed: L hip pain and knee pain - pain is likely due to the injury to suffered in [employer].  I have prescribed you meloxicam  15 mg daily for 1 week.  Referral to physical therapy was also provided to help with your pain and mobility. Follow-up in 1-2 weeks with Dr. Irby Mannan or Dr. Baby Bolt if pain persists for possible ultrasound  You should return to our clinic Return in about 2 weeks (around 01/30/2024). Please arrive 15 minutes before your appointment to ensure smooth check in process.  We appreciate your efforts in making this happen.  Thank you for allowing me to participate in your care, Clyda Dark, DO 01/16/2024, 5:02 PM PGY-1, [facility]

## 2024-01-16 NOTE — Progress Notes (Signed)
   SUBJECTIVE:  Interpreter usedSuzzanna Estimable [phone]  CHIEF COMPLAINT / HPI: L leg/foot pain  Left leg/foot pain patient recently had a fall at [facility] on some oil causing her legs to split apart and she landed on her left side.  She went to the ED on 5/4 due to having left-sided back pain, hip pain and knee pain.  Patient was treated with Norco and Flexeril  after imaging revealed no fractures.  Patient reports that the Norco and Flexeril  hasn't helped.  Currently the pain is her hip and knee mainly with walking.  She feels a "pulling" sensation.  She also endorses knee and leg swelling.  Has been using crutches to get around.  Been taking Ibuprofen  800 mg and that doesn't help either.  For now she isn't working, but in her house she is taking care of her kids and doing household chores.   PERTINENT  PMH / PSH: Reviewed  OBJECTIVE:   BP 129/80   Pulse 93   Ht 5\' 1"  (1.549 m)   Wt 243 lb 2 oz (110.3 kg)   LMP 08/07/2022 (Exact Date)   SpO2 98%   BMI 45.94 kg/m   General: Awake and Alert in NAD HEENT: NCAT. Sclera anicteric. No rhinorrhea. Cardiovascular: RRR. No M/R/G Respiratory: CTAB, normal WOB on RA. No wheezing, crackles, rhonchi, or diminished breath sounds. Abdomen: Soft, non-tender, non-distended. Bowel sounds normoactive Extremities: Able to move all extremities. No BLE edema, no deformities or significant joint findings.  TTP over L hip.  ROM limited by pain of hip.  Swelling noted over left knee with TTP.  Pain with anterior drawer, valgus, and varus stress.  No pain with posterior drawer.  Pain with flexion and extension of L hip and knee. Skin: Warm and dry. No abrasions or rashes noted. Neuro: A&Ox3. No focal neurological deficits.  ASSESSMENT/PLAN:   Assessment & Plan Acute pain of left knee L knee pain due to fall with some mild swelling noted and pain with anterior drawer, valgus, and varus stress. Imaging in the ED was negative for fracture.  - Meloxicam  15 mg daily  for 1 week - Referral to PT - Advised patient that if her symptoms persist over the course of the next 1-2 weeks she should follow-up for possible knee ultrasound vs MRI Bursitis of left hip, unspecified bursa Pinpoint tenderness over left hip and range of motion limited by pain likely secondary to fall. - Meloxicam  15 mg daily for 1 week - Referral to PT   Clyda Dark, DO [facility]

## 2024-01-20 ENCOUNTER — Ambulatory Visit: Admitting: Student

## 2024-01-20 VITALS — BP 116/89 | HR 93 | Ht 61.0 in | Wt 243.8 lb

## 2024-01-20 DIAGNOSIS — B078 Other viral warts: Secondary | ICD-10-CM | POA: Diagnosis not present

## 2024-01-20 NOTE — Progress Notes (Addendum)
  SUBJECTIVE:   CHIEF COMPLAINT / HPI:   Deborah Chandler is a 43 year old female who presents with a persistent finger lesion.  The lesion on her finger has persisted for over twenty days without changes in appearance or spreading to other fingers. It initially appeared as a small fungal-like growth and has since spread over the finger. There are no similar lesions on her toes. No similar symptoms are present in her household contacts. She recently sustained a cut on the affected finger and has been applying Vaseline. She is not currently working and engages in activities such as washing dishes and cooking from Monday to Wednesday.  She was last seen for this on 12/27/2023.  Advised to apply Vaseline and bacitracin cream although has only been applying vaseline.  Spanish interpreter utilized throughout encounter.  PERTINENT  PMH / PSH: GERD, chronic cystitis, IBS, morbid obesity, HTN, prediabetes  OBJECTIVE:  BP 116/89   Pulse 93   Ht 5\' 1"  (1.549 m)   Wt 243 lb 12.8 oz (110.6 kg)   LMP 08/07/2022 (Exact Date)   SpO2 99%   BMI 46.07 kg/m  General: Well-appearing, NAD Skin: Rough, raised hyperkeratotic formation with scattered excoriations on the plantar surface right middle finger not including nailbed consistent with viral warts    ASSESSMENT/PLAN:   Assessment & Plan Other viral warts Appearance most consistent with viral warts, educated on cryotherapy, performed on the most significant portions of hyperkeratotic surface. See procedure note below. Recommend Vaseline for wound care.  Follow-up in 2 weeks for potential retreatment.  Diagnosis: Viral wart Procedure: Cryotherapy Location: Plantar surface of right middle finger  After discussion of the risks, benefits, and alternative therapies available, the patient elected to proceed. After obtaining written informed consent, the patient's identity, procedure, and site were verified during a time out prior to proceeding  procedure. The lesions on the right middle finger were treated using liquid nitrogen spray gun for 6 second per cycle, 3 cycles total. The patient tolerated the procedure well and there were no immediate complications.  Patient was provided aftercare handout and advised to return if lesion(s) did not fully resolved.   Return in about 2 weeks (around 02/03/2024) for Wart follow-up.  Veronia Goon, DO 01/20/2024, 4:35 PM PGY-3, [facility]

## 2024-01-20 NOTE — Patient Instructions (Addendum)
 It was great to see you today! Thank you for choosing [facility] for your primary care.  Hoy abordamos: 1. Usamos crioterapia para las verrugas de su dedo. Estarn sensibles durante los prximos [place], por lo que le recomiendo usar vaselina varias veces al [employer]. Considere usar guantes al [employer]. Le recomiendo que regrese [employer] para Ebb Goldman revisin y considere otra ronda de crioterapia. Por favor, avsenos si la zona comienza a presentar secrecin o enrojecimiento significativo que aumenta de tamao, ya que esto podra ser un signo de infeccin.  Today we addressed: We used cryotherapy for the warts on your finger.  This will get tender for the next few days to which I recommend you use Vaseline several times a day.  I would consider wearing gloves when you are washing dishes.  I recommend you return in 2 weeks for follow-up and consideration of another round of cryotherapy.  Please let us  know if the area starts to have discharge or any significant redness that is enlarging as that may be a sign for infection.  If you haven't already, sign up for My Chart to have easy access to your labs results, and communication with your primary care physician.  Return in about 2 weeks (around 02/03/2024) for Wart follow-up. Please arrive 15 minutes before your appointment to ensure smooth check in process.  We appreciate your efforts in making this happen.  Thank you for allowing me to participate in your care, Deborah Goon, DO 01/20/2024, 4:33 PM PGY-3, [facility]
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
## 2024-02-07 ENCOUNTER — Ambulatory Visit

## 2024-02-09 ENCOUNTER — Other Ambulatory Visit: Payer: Self-pay

## 2024-02-09 ENCOUNTER — Ambulatory Visit: Attending: Family Medicine

## 2024-02-09 DIAGNOSIS — M25552 Pain in left hip: Secondary | ICD-10-CM | POA: Insufficient documentation

## 2024-02-09 DIAGNOSIS — M25562 Pain in left knee: Secondary | ICD-10-CM | POA: Diagnosis not present

## 2024-02-09 DIAGNOSIS — M6281 Muscle weakness (generalized): Secondary | ICD-10-CM | POA: Diagnosis not present

## 2024-02-09 DIAGNOSIS — M7072 Other bursitis of hip, left hip: Secondary | ICD-10-CM | POA: Diagnosis not present
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
## 2024-02-14 ENCOUNTER — Ambulatory Visit (INDEPENDENT_AMBULATORY_CARE_PROVIDER_SITE_OTHER): Admitting: Family Medicine

## 2024-02-14 ENCOUNTER — Encounter: Payer: Self-pay | Admitting: *Deleted

## 2024-02-14 ENCOUNTER — Encounter: Payer: Self-pay | Admitting: Family Medicine

## 2024-02-14 VITALS — BP 117/79 | HR 83 | Ht 61.0 in | Wt 243.6 lb

## 2024-02-14 DIAGNOSIS — L989 Disorder of the skin and subcutaneous tissue, unspecified: Secondary | ICD-10-CM

## 2024-02-14 DIAGNOSIS — I10 Essential (primary) hypertension: Secondary | ICD-10-CM

## 2024-02-14 DIAGNOSIS — M25562 Pain in left knee: Secondary | ICD-10-CM

## 2024-02-14 MED ORDER — MELOXICAM 15 MG PO TABS: 15.0000 mg | ORAL_TABLET | Freq: Every day | ORAL | 0 refills | Status: DC

## 2024-02-14 MED ORDER — AMLODIPINE-OLMESARTAN 5-20 MG PO TABS: 1.0000 | ORAL_TABLET | Freq: Every day | ORAL | 0 refills | Status: DC

## 2024-02-14 NOTE — Patient Instructions (Addendum)
 You should get a call to schedule your MRI soon  Please take meloxicam  daily - DO NOT take ibuprofen  or naproxen  with this  You can continue using acetaminophen   Debera recibir Deborah Chandler llamada para programar su resonancia magntica pronto   Por favor, tome meloxicam  diariamente - NO tome ibuprofeno o naproxeno con esto   Puede seguir usando acetaminofn

## 2024-02-14 NOTE — Progress Notes (Signed)
    SUBJECTIVE:   CHIEF COMPLAINT / HPI:   Finger and foot pain Received cryotherapy 5/16 for wart on R middle finger Seen in clinic 5/12 for L leg/foot pain after a fall. ED imaging on 5/4 was negative Was prescribed meloxicam  and referred to PT. She has gone to PT  Finger improved a little bit after cryotherapy However, still having pain - especially with exposure to warm water such as while cooking or bathing Using vaseline at night Remains painful throughout the night  L knee has been bothersome since mother's day when she fell Feels this has been getting worse Went to PT where she reports there was concern about tendon injury Patient feel her pain is too bad to undergo PT Has also been doing home exercises which cause pain Did not use meloxicam .  Has only been using acetaminophen  and ibuprofen    PERTINENT  PMH / PSH: HTN, obesity  OBJECTIVE:   BP 117/79   Pulse 83   Ht 5\' 1"  (1.549 m)   Wt 243 lb 9.6 oz (110.5 kg)   LMP 08/07/2022 (Exact Date)   SpO2 94%   BMI 46.03 kg/m   General: NAD, pleasant, able to participate in exam Respiratory: No respiratory distress Psych: Normal affect and mood  MSK: Left knee:  Significantly swollen compared to right knee.  Slight discoloration over anterior knee, unclear whether old ecchymosis versus skin hyperpigmentation.  Limited ROM due to pain.  TTP diffusely but primarily over the lateral and anterior joint line.  No significant laxity and negative anterior/posterior drawers, varus/valgus testing although these exams were limited due to pain and body habitus.  Using crutches to walk, pain with bearing weight.  Right middle finger:  Healing skin lesion with less erythema and hyperkeratosis compared to prior images.  Nontender to palpation.  No significant fluid collection or abscess.     ASSESSMENT/PLAN:   Assessment & Plan Left knee pain, unspecified chronicity No significant response to PT or oral medications Has  persistent swelling, pain, difficulty bearing weight Obtain MRI of left knee, follow-up result and manage as appropriate Also Rx meloxicam  as patient did not pick this up.  Advised to not use with other NSAIDs Finger lesion Appears markedly improved from prior after cryotherapy session.  Elected not to do cryotherapy today.  Consider in the future if worsening.   Edison Gore, MD [facility]
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
## 2024-02-22 ENCOUNTER — Ambulatory Visit

## 2024-02-22 DIAGNOSIS — M25562 Pain in left knee: Secondary | ICD-10-CM | POA: Diagnosis not present

## 2024-02-22 DIAGNOSIS — M25552 Pain in left hip: Secondary | ICD-10-CM

## 2024-02-22 DIAGNOSIS — M7072 Other bursitis of hip, left hip: Secondary | ICD-10-CM | POA: Diagnosis not present

## 2024-02-22 DIAGNOSIS — M6281 Muscle weakness (generalized): Secondary | ICD-10-CM | POA: Diagnosis not present

## 2024-02-23 ENCOUNTER — Ambulatory Visit (HOSPITAL_COMMUNITY)

## 2024-02-23 ENCOUNTER — Ambulatory Visit (INDEPENDENT_AMBULATORY_CARE_PROVIDER_SITE_OTHER): Admitting: Obstetrics and Gynecology

## 2024-02-23 VITALS — BP 148/94 | HR 91

## 2024-02-23 DIAGNOSIS — N3941 Urge incontinence: Secondary | ICD-10-CM

## 2024-02-23 DIAGNOSIS — N3281 Overactive bladder: Secondary | ICD-10-CM | POA: Diagnosis not present

## 2024-02-23 MED ORDER — TROSPIUM CHLORIDE ER 60 MG PO CP24
60.0000 mg | ORAL_CAPSULE | Freq: Every day | ORAL | 5 refills | Status: DC
Start: 2024-02-23 — End: 2024-06-21
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
## 2024-02-29 ENCOUNTER — Ambulatory Visit

## 2024-02-29 ENCOUNTER — Encounter

## 2024-02-29 DIAGNOSIS — M7072 Other bursitis of hip, left hip: Secondary | ICD-10-CM | POA: Diagnosis not present

## 2024-02-29 DIAGNOSIS — M6281 Muscle weakness (generalized): Secondary | ICD-10-CM | POA: Diagnosis not present

## 2024-02-29 DIAGNOSIS — M25552 Pain in left hip: Secondary | ICD-10-CM

## 2024-02-29 DIAGNOSIS — M25562 Pain in left knee: Secondary | ICD-10-CM
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
## 2024-03-06 ENCOUNTER — Ambulatory Visit: Attending: Family Medicine

## 2024-03-06 DIAGNOSIS — M25552 Pain in left hip: Secondary | ICD-10-CM | POA: Diagnosis not present

## 2024-03-06 DIAGNOSIS — M25562 Pain in left knee: Secondary | ICD-10-CM | POA: Insufficient documentation

## 2024-03-06 DIAGNOSIS — M6281 Muscle weakness (generalized): Secondary | ICD-10-CM | POA: Diagnosis not present

## 2024-03-07 ENCOUNTER — Encounter
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
## 2024-03-13 ENCOUNTER — Telehealth: Payer: Self-pay

## 2024-03-13 ENCOUNTER — Encounter

## 2024-03-13 ENCOUNTER — Encounter: Payer: Self-pay | Admitting: Family Medicine

## 2024-03-13 ENCOUNTER — Ambulatory Visit: Admitting: Family Medicine

## 2024-03-13 VITALS — BP 137/100 | HR 85 | Ht 61.0 in | Wt 246.2 lb

## 2024-03-13 DIAGNOSIS — M25562 Pain in left knee: Secondary | ICD-10-CM | POA: Diagnosis not present

## 2024-03-13 DIAGNOSIS — I10 Essential (primary) hypertension: Secondary | ICD-10-CM

## 2024-03-13 DIAGNOSIS — T161XXA Foreign body in right ear, initial encounter: Secondary | ICD-10-CM

## 2024-03-13 MED ORDER — MELOXICAM 15 MG PO TABS: 15.0000 mg | ORAL_TABLET | Freq: Every day | ORAL | 0 refills | Status: DC

## 2024-03-13 NOTE — Patient Instructions (Addendum)
 I have reordered the MRI  I have sent a refill of meloxicam   Continue taking your blood pressure medications  I have sent referrals to ear doctor and knee doctor   He vuelto a solicitar la resonancia magntica.  He enviado una nueva receta de meloxicam .  Siga tomando sus medicamentos para la presin arterial.  He enviado derivaciones a otorrinolaringlogos y Cabin crew rodilla.

## 2024-03-13 NOTE — Progress Notes (Signed)
    SUBJECTIVE:   CHIEF COMPLAINT / HPI:   Sensation of foreign body in R ear, causing pain X 1 day since last night Was using q-tip and thought part of it got stuck  No hearing loss but having trouble sleeping because of the pain   L knee pain Fell on Mothers day and has had worsening pain and swelling PT has not been helping and pain prevents her from doing well with PT Requesting more meloxicam    PERTINENT  PMH / PSH: HTN, obesity  OBJECTIVE:   BP (!) 137/100   Pulse 85   Ht 5' 1 (1.549 m)   Wt 246 lb 3.2 oz (111.7 kg)   LMP 08/07/2022 (Exact Date)   SpO2 100%   BMI 46.52 kg/m    General: NAD, pleasant, using crutch HEENT: R ear without visible foreign body, significant cerumen in canal. L TM and canal unremarkable. After irrigation of R ear, some residual cotton visible Respiratory: No respiratory distress Skin: warm and dry, no rashes noted Psych: Normal affect and mood  L knee Significantly swollen compared to right Slight discoloration over anterior knee Limited ROM due to pain TTP diffusely but primarily over lateral and anterior joint line.  No significant laxity and negative anterior/posterior drawers, varus/valgus testing although these were limited due to pain/body habitus Using crutches to, pain with weightbearing   ASSESSMENT/PLAN:   Assessment & Plan Foreign body of right ear, initial encounter No foreign body seen initially Ear irrigated, there is some cotton I can see Attempted to remove this using ear curette without success Referral ENT ED precautions discusssed Left knee pain, unspecified chronicity After traumatic injury MRI was denied by insurance No significant response to PT or oral medications Persistent swelling, pain, difficulty bearing weight Negative x ray but I do have concern for ligamentous or meniscal injury given the extent of her symptoms Re ordered MRI L knee Also placed sports medicine referral in case she is able to  get in quicker for ultrasound Primary hypertension BP elevated today, possibly due to pain On Azor  Recommend PCP f/u, scheduled for 7/22   Payton Coward, MD [facility]

## 2024-03-13 NOTE — Telephone Encounter (Signed)
 Patient's daughter calls nurse line requesting to schedule same day appointment.   Patient has been experiencing intermittent episodes of SHOB for the last two days.   She denies associated chest pain. She has been using albuterol  inhaler during these episodes with some improvement. No current SHOB, patient speaking in complete sentences.   Daughter also reports concerns with end of Q-tip getting stuck in right ear. Reports that patient is having pain in the ear and is concerned that there was also a wood piece on the end of the Q-tip.   Scheduled for this afternoon with Dr. Romelle.   Chiquita JAYSON English, RN

## 2024-03-13 NOTE — Assessment & Plan Note (Signed)
 BP elevated today, possibly due to pain On Azor  Recommend PCP f/u, scheduled for 7/22

## 2024-03-14 ENCOUNTER — Encounter (HOSPITAL_COMMUNITY): Payer: Self-pay

## 2024-03-14 ENCOUNTER — Ambulatory Visit (HOSPITAL_COMMUNITY)
Admission: EM | Admit: 2024-03-14 | Discharge: 2024-03-14 | Disposition: A | Discharge status: Home / Self Care | Attending: Family Medicine | Admitting: Family Medicine

## 2024-03-14 ENCOUNTER — Ambulatory Visit

## 2024-03-14 ENCOUNTER — Encounter

## 2024-03-14 DIAGNOSIS — M25552 Pain in left hip: Secondary | ICD-10-CM

## 2024-03-14 DIAGNOSIS — M25562 Pain in left knee: Secondary | ICD-10-CM | POA: Diagnosis not present

## 2024-03-14 DIAGNOSIS — T161XXA Foreign body in right ear, initial encounter: Secondary | ICD-10-CM | POA: Diagnosis not present

## 2024-03-14 DIAGNOSIS — M6281 Muscle weakness (generalized): Secondary | ICD-10-CM

## 2024-03-14 DIAGNOSIS — H6123 Impacted cerumen, bilateral: Secondary | ICD-10-CM

## 2024-03-14 NOTE — Discharge Instructions (Signed)
 We have removed the Q-tip in right ear today.  Avoid using Q-tips in the future to clean your ears to avoid this in the future. You can use over-the-counter Debrox eardrops 5 drops twice daily to help with earwax buildup. Follow-up with your primary care provider or return here as needed.
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
## 2024-03-14 NOTE — ED Triage Notes (Signed)
 Pt states qtip broke off in rt ear 2 days ago. States went to PCP yesterday and they flushed her ear and couldn't get it out and told to come here in pain got worse.

## 2024-03-15 ENCOUNTER — Ambulatory Visit: Admitting: Obstetrics and Gynecology

## 2024-03-15 VITALS — BP 112/79 | HR 83

## 2024-03-15 DIAGNOSIS — N3281 Overactive bladder: Secondary | ICD-10-CM

## 2024-03-15 DIAGNOSIS — N3941 Urge incontinence: Secondary | ICD-10-CM

## 2024-03-15 NOTE — Progress Notes (Signed)
 [facility]  PTNS VISIT  CC:  Overactive bladder  43 y.o. with refractory overactive bladder who presents for percutaneous tibial nerve stimulation. The patient presents for PTNS session # 1.   Procedure: The patient was placed in the sitting position and the right lower extremity was prepped in the usual fashion. The PTNS needle was then inserted at a 60 degree angle, 5 cm cephalad and 2 cm posterior to the medial malleolus. The PTNS unit was then programmed and an optimal response was noted at 6 milliamps. The PTNS stimulation was then performed at this setting for 30 minutes without incident and the patient tolerated the procedure well. The needle was removed and hemostasis was noted.   The pt will return in 1 week for PTNS session # 2. All questions were answered.

## 2024-03-20 ENCOUNTER — Encounter: Payer: Self-pay | Admitting: Physical Therapy

## 2024-03-20 ENCOUNTER — Ambulatory Visit (HOSPITAL_COMMUNITY)
Admission: RE | Admit: 2024-03-20 | Discharge: 2024-03-20 | Disposition: A | Discharge status: Home / Self Care | Source: Ambulatory Visit | Attending: Family Medicine | Admitting: Family Medicine

## 2024-03-20 DIAGNOSIS — M25562 Pain in left knee: Secondary | ICD-10-CM | POA: Insufficient documentation

## 2024-03-22 ENCOUNTER — Ambulatory Visit: Admitting: Obstetrics and Gynecology

## 2024-03-22 ENCOUNTER — Ambulatory Visit: Admitting: Family Medicine

## 2024-03-22 ENCOUNTER — Encounter (INDEPENDENT_AMBULATORY_CARE_PROVIDER_SITE_OTHER): Payer: Self-pay

## 2024-03-23 ENCOUNTER — Ambulatory Visit: Payer: Self-pay | Admitting: Family Medicine

## 2024-03-27 ENCOUNTER — Ambulatory Visit (INDEPENDENT_AMBULATORY_CARE_PROVIDER_SITE_OTHER): Admitting: Family Medicine

## 2024-03-27 ENCOUNTER — Encounter: Payer: Self-pay | Admitting: Family Medicine

## 2024-03-27 VITALS — BP 130/72 | HR 92 | Ht 61.0 in | Wt 243.2 lb

## 2024-03-27 DIAGNOSIS — I10 Essential (primary) hypertension: Secondary | ICD-10-CM | POA: Diagnosis not present

## 2024-03-27 DIAGNOSIS — M25562 Pain in left knee: Secondary | ICD-10-CM | POA: Diagnosis not present

## 2024-03-27 MED ORDER — AMLODIPINE-OLMESARTAN 5-20 MG PO TABS: 1.0000 | ORAL_TABLET | Freq: Every day | ORAL | 3 refills | Status: DC
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
## 2024-03-27 NOTE — Progress Notes (Unsigned)
    SUBJECTIVE:   CHIEF COMPLAINT / HPI:   ***  PERTINENT  PMH / PSH: ***  OBJECTIVE:   LMP 08/07/2022 (Exact Date)   ***  ASSESSMENT/PLAN:   Assessment & Plan      Twyla Nearing, MD [facility]

## 2024-03-27 NOTE — Patient Instructions (Addendum)
 Le enviaremos una copia de su resonancia magntica por correo. Por favor, consulte maana con los mdicos de [facility] para tratar su dolor de rodilla.     We will mail a copy of your MRI to you. Please follow-up with the Sports Medicine doctors tomorrow for your knee pain.

## 2024-03-27 NOTE — Assessment & Plan Note (Signed)
-   BP elevated on last PCP visit, possibly secondary to pain - Recheck WNL. Continue Azor

## 2024-03-28 ENCOUNTER — Ambulatory Visit (INDEPENDENT_AMBULATORY_CARE_PROVIDER_SITE_OTHER): Admitting: Family Medicine

## 2024-03-28 VITALS — BP 109/77 | Ht 61.0 in | Wt 243.0 lb

## 2024-03-28 DIAGNOSIS — M25562 Pain in left knee: Secondary | ICD-10-CM

## 2024-03-28 MED ORDER — METHYLPREDNISOLONE ACETATE 40 MG/ML IJ SUSP
40.0000 mg | Freq: Once | INTRAMUSCULAR | Status: AC
  Administered 2024-03-28: 40 mg via INTRA_ARTICULAR
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
## 2024-03-28 NOTE — Patient Instructions (Signed)
 Your MCL is healing well based on your exam. Try to wean off the crutches if possible. You have some underlying arthritis that the shot should help with. Icing as needed. Aleve  2 tabs twice a day with food as needed. I would continue with physical therapy for this leg. Follow up in 1 month for reevaluation.

## 2024-03-29 ENCOUNTER — Encounter: Payer: Self-pay | Admitting: Obstetrics and Gynecology

## 2024-03-29 ENCOUNTER — Ambulatory Visit (INDEPENDENT_AMBULATORY_CARE_PROVIDER_SITE_OTHER): Admitting: Obstetrics and Gynecology

## 2024-03-29 DIAGNOSIS — N3281 Overactive bladder: Secondary | ICD-10-CM | POA: Diagnosis not present

## 2024-03-29 DIAGNOSIS — N3941 Urge incontinence: Secondary | ICD-10-CM

## 2024-03-29 NOTE — Progress Notes (Signed)
 [facility]  PTNS VISIT  CC:  Overactive bladder  43 y.o. with refractory overactive bladder who presents for percutaneous tibial nerve stimulation. The patient presents for PTNS session # 2.   Procedure: The patient was placed in the sitting position and the right lower extremity was prepped in the usual fashion. The PTNS needle was then inserted at a 60 degree angle, 5 cm cephalad and 2 cm posterior to the medial malleolus. The PTNS unit was then programmed and an optimal response was noted at 9 milliamps. The PTNS stimulation was then performed at this setting for 30 minutes without incident and the patient tolerated the procedure well. The needle was removed and hemostasis was noted.   The pt will return in 1 week for PTNS session # 3. All questions were answered.

## 2024-04-05 ENCOUNTER — Ambulatory Visit (INDEPENDENT_AMBULATORY_CARE_PROVIDER_SITE_OTHER): Admitting: Physician Assistant

## 2024-04-05 ENCOUNTER — Encounter (INDEPENDENT_AMBULATORY_CARE_PROVIDER_SITE_OTHER): Payer: Self-pay | Admitting: Physician Assistant

## 2024-04-05 VITALS — BP 127/86 | HR 88

## 2024-04-05 DIAGNOSIS — H6123 Impacted cerumen, bilateral: Secondary | ICD-10-CM | POA: Diagnosis not present
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
## 2024-04-12 ENCOUNTER — Ambulatory Visit (INDEPENDENT_AMBULATORY_CARE_PROVIDER_SITE_OTHER): Admitting: Obstetrics and Gynecology

## 2024-04-12 ENCOUNTER — Encounter: Payer: Self-pay | Admitting: Obstetrics and Gynecology

## 2024-04-12 VITALS — BP 131/85 | HR 79

## 2024-04-12 DIAGNOSIS — N3941 Urge incontinence: Secondary | ICD-10-CM

## 2024-04-12 DIAGNOSIS — N3281 Overactive bladder: Secondary | ICD-10-CM | POA: Diagnosis not present

## 2024-04-12 NOTE — Progress Notes (Signed)
 [facility]  PTNS VISIT  CC:  Overactive bladder  43 y.o. with refractory overactive bladder who presents for percutaneous tibial nerve stimulation. The patient presents for PTNS session # 3.   She is unsure if this is helping her bladder but wishes to continue. She reports she is still having pain from her fall that she is working through with rehab.   Procedure: The patient was placed in the sitting position and the right lower extremity was prepped in the usual fashion. The PTNS needle was then inserted at a 60 degree angle, 5 cm cephalad and 2 cm posterior to the medial malleolus. The PTNS unit was then programmed and an optimal response was noted at 4 milliamps. The PTNS stimulation was then performed at this setting for 30 minutes without incident and the patient tolerated the procedure well. The needle was removed and hemostasis was noted.   The pt will return in 1 week for PTNS session # 4. All questions were answered.
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
## 2024-04-16 ENCOUNTER — Ambulatory Visit: Attending: Family Medicine

## 2024-04-16 DIAGNOSIS — M25562 Pain in left knee: Secondary | ICD-10-CM | POA: Diagnosis not present

## 2024-04-16 DIAGNOSIS — M25552 Pain in left hip: Secondary | ICD-10-CM | POA: Diagnosis not present

## 2024-04-16 DIAGNOSIS — M6281 Muscle weakness (generalized): Secondary | ICD-10-CM | POA: Insufficient documentation

## 2024-04-19 ENCOUNTER — Ambulatory Visit (INDEPENDENT_AMBULATORY_CARE_PROVIDER_SITE_OTHER): Admitting: Obstetrics and Gynecology

## 2024-04-19 ENCOUNTER — Encounter: Payer: Self-pay | Admitting: Obstetrics and Gynecology

## 2024-04-19 VITALS — BP 115/84 | HR 90

## 2024-04-19 DIAGNOSIS — N3281 Overactive bladder: Secondary | ICD-10-CM

## 2024-04-19 DIAGNOSIS — N3941 Urge incontinence: Secondary | ICD-10-CM

## 2024-04-19 MED ORDER — METAMUCIL SMOOTH TEXTURE 58.6 % PO POWD: 1.0000 | Freq: Every day | ORAL | 12 refills | Status: DC

## 2024-04-19 NOTE — Progress Notes (Signed)
 [facility]  PTNS VISIT  CC:  Overactive bladder  43 y.o. with refractory overactive bladder who presents for percutaneous tibial nerve stimulation. The patient presents for PTNS session # 4.   Procedure: The patient was placed in the sitting position and the right lower extremity was prepped in the usual fashion. The PTNS needle was then inserted at a 60 degree angle, 5 cm cephalad and 2 cm posterior to the medial malleolus. The PTNS unit was then programmed and an optimal response was noted at 4 milliamps. The PTNS stimulation was then performed at this setting for 30 minutes without incident and the patient tolerated the procedure well. The needle was removed and hemostasis was noted.   The pt will return in 1 week for PTNS session # 5. All questions were answered.

## 2024-04-19 NOTE — Patient Instructions (Addendum)
 Start adding in fiber daily to bulk your stool and make it less runny

## 2024-04-25 ENCOUNTER — Ambulatory Visit: Admitting: Family Medicine

## 2024-04-25 VITALS — BP 138/88 | Ht 61.0 in | Wt 243.0 lb

## 2024-04-25 DIAGNOSIS — M25562 Pain in left knee: Secondary | ICD-10-CM | POA: Diagnosis not present

## 2024-04-25 DIAGNOSIS — M25552 Pain in left hip: Secondary | ICD-10-CM

## 2024-04-25 DIAGNOSIS — M79605 Pain in left leg: Secondary | ICD-10-CM | POA: Diagnosis not present

## 2024-04-25 NOTE — Patient Instructions (Signed)
 We will go ahead with MRIs of your left hip and lumbar spine to see if there's a reason for referred pain into this leg and knee. Take aleve  2 tabs twice a day with food for pain and inflammation. Tylenol  500mg  1-2 tabs twice a day with food as needed for pain. Follow up with me after the MRI results come back to review them and next steps.
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
## 2024-04-26 ENCOUNTER — Ambulatory Visit: Admitting: Obstetrics and Gynecology

## 2024-04-26 ENCOUNTER — Encounter: Payer: Self-pay | Admitting: Obstetrics and Gynecology

## 2024-04-26 VITALS — BP 119/81 | HR 89

## 2024-04-26 DIAGNOSIS — N3281 Overactive bladder: Secondary | ICD-10-CM | POA: Diagnosis not present

## 2024-04-26 DIAGNOSIS — N3941 Urge incontinence: Secondary | ICD-10-CM

## 2024-04-26 NOTE — Progress Notes (Signed)
 [facility]  PTNS VISIT  CC:  Overactive bladder  43 y.o. with refractory overactive bladder who presents for percutaneous tibial nerve stimulation. The patient presents for PTNS session # 5.  Patient reports no change in her pain from doing PT for the knee pain and into her left hip and back.  Her sports medicine provider has ordered MRIs of lumbar and left hip.   Patient reports her urinary symptoms have been improved but her bowels are still much looser and she has fecal urgency. She states anything she eats or drinks, she has the fecal urgency. She is taking metamucil to make it to the bathroom which is helpful, but causes stomach discomfort.   Procedure: The patient was placed in the sitting position and the right lower extremity was prepped in the usual fashion. The PTNS needle was then inserted at a 60 degree angle, 5 cm cephalad and 2 cm posterior to the medial malleolus. The PTNS unit was then programmed and an optimal response was noted at 4 milliamps. The PTNS stimulation was then performed at this setting for 30 minutes without incident and the patient tolerated the procedure well. The needle was removed and hemostasis was noted.   The pt will return in 1 week for PTNS session # 6. All questions were answered.
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
## 2024-04-27 ENCOUNTER — Ambulatory Visit

## 2024-04-27 DIAGNOSIS — M25552 Pain in left hip: Secondary | ICD-10-CM | POA: Diagnosis not present

## 2024-04-27 DIAGNOSIS — M25562 Pain in left knee: Secondary | ICD-10-CM | POA: Diagnosis not present

## 2024-04-27 DIAGNOSIS — M6281 Muscle weakness (generalized): Secondary | ICD-10-CM

## 2024-05-01 ENCOUNTER — Encounter: Payer: Self-pay | Admitting: Family Medicine

## 2024-05-03 ENCOUNTER — Ambulatory Visit (INDEPENDENT_AMBULATORY_CARE_PROVIDER_SITE_OTHER): Admitting: Obstetrics and Gynecology

## 2024-05-03 ENCOUNTER — Encounter: Payer: Self-pay | Admitting: Obstetrics and Gynecology

## 2024-05-03 VITALS — BP 122/86 | HR 80

## 2024-05-03 DIAGNOSIS — N3281 Overactive bladder: Secondary | ICD-10-CM

## 2024-05-03 DIAGNOSIS — N3941 Urge incontinence: Secondary | ICD-10-CM | POA: Diagnosis not present

## 2024-05-03 NOTE — Progress Notes (Signed)
  [facility]  PTNS VISIT  CC:  Overactive bladder  43 y.o. with refractory overactive bladder who presents for percutaneous tibial nerve stimulation. The patient presents for PTNS session # 6.  Patient reports she had a small amount of leakage today, but otherwise has been doing well. Reports still no improvement in her bowel urgency.  She has an MRI scheduled Sept 5th   Procedure: The patient was placed in the sitting position and the right lower extremity was prepped in the usual fashion. The PTNS needle was then inserted at a 60 degree angle, 5 cm cephalad and 2 cm posterior to the medial malleolus. The PTNS unit was then programmed and an optimal response was noted at 8 milliamps. The PTNS stimulation was then performed at this setting for 30 minutes without incident and the patient tolerated the procedure well. The needle was removed and hemostasis was noted.   The pt will return in 1 week for PTNS session # 7. All questions were answered.

## 2024-05-09 ENCOUNTER — Encounter: Payer: Self-pay | Admitting: Obstetrics and Gynecology

## 2024-05-09 ENCOUNTER — Ambulatory Visit (INDEPENDENT_AMBULATORY_CARE_PROVIDER_SITE_OTHER): Admitting: Obstetrics and Gynecology

## 2024-05-09 VITALS — BP 139/88 | HR 85

## 2024-05-09 DIAGNOSIS — N3281 Overactive bladder: Secondary | ICD-10-CM | POA: Diagnosis not present

## 2024-05-09 DIAGNOSIS — N3941 Urge incontinence: Secondary | ICD-10-CM

## 2024-05-09 NOTE — Progress Notes (Signed)
 [facility]  PTNS VISIT  CC:  Overactive bladder  43 y.o. with refractory overactive bladder who presents for percutaneous tibial nerve stimulation. The patient presents for PTNS session # 7.  Patient reports she is still seeing signs of improvement in her bladder function. Is awaiting to have her MRI related to some of her pain and possibly bowel function.   Procedure: The patient was placed in the sitting position and the right lower extremity was prepped in the usual fashion. The PTNS needle was then inserted at a 60 degree angle, 5 cm cephalad and 2 cm posterior to the medial malleolus. The PTNS unit was then programmed and an optimal response was noted at 2 milliamps. The PTNS stimulation was then performed at this setting for 30 minutes without incident and the patient tolerated the procedure well. The needle was removed and hemostasis was noted.   The pt will return in 1 week for PTNS session # 8. All questions were answered.

## 2024-05-11 ENCOUNTER — Ambulatory Visit
Admission: RE | Admit: 2024-05-11 | Discharge: 2024-05-11 | Disposition: A | Discharge status: Home / Self Care | Source: Ambulatory Visit | Attending: Family Medicine | Admitting: Family Medicine

## 2024-05-11 DIAGNOSIS — M25552 Pain in left hip: Secondary | ICD-10-CM

## 2024-05-11 DIAGNOSIS — M79605 Pain in left leg: Secondary | ICD-10-CM
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
## 2024-05-16 ENCOUNTER — Ambulatory Visit: Attending: Family Medicine

## 2024-05-16 DIAGNOSIS — M25552 Pain in left hip: Secondary | ICD-10-CM | POA: Diagnosis present

## 2024-05-16 DIAGNOSIS — M25562 Pain in left knee: Secondary | ICD-10-CM | POA: Insufficient documentation

## 2024-05-16 DIAGNOSIS — M6281 Muscle weakness (generalized): Secondary | ICD-10-CM | POA: Insufficient documentation

## 2024-05-17 ENCOUNTER — Ambulatory Visit: Admitting: Obstetrics and Gynecology

## 2024-05-17 ENCOUNTER — Encounter: Payer: Self-pay | Admitting: Obstetrics and Gynecology

## 2024-05-17 DIAGNOSIS — N3281 Overactive bladder: Secondary | ICD-10-CM

## 2024-05-17 DIAGNOSIS — N3941 Urge incontinence: Secondary | ICD-10-CM

## 2024-05-17 NOTE — Progress Notes (Signed)
 [facility]  PTNS VISIT  CC:  Overactive bladder  43 y.o. with refractory overactive bladder who presents for percutaneous tibial nerve stimulation. The patient presents for PTNS session # 8.  Patient has gotten her MRI and is awaiting results to discuss with orthopedic.   Patient does still feel that she has had supportive results with PTNS for bladder but not as much for bowels.  Procedure: The patient was placed in the sitting position and the right lower extremity was prepped in the usual fashion. The PTNS needle was then inserted at a 60 degree angle, 5 cm cephalad and 2 cm posterior to the medial malleolus. The PTNS unit was then programmed and an optimal response was noted at 6 milliamps. The PTNS stimulation was then performed at this setting for 30 minutes without incident and the patient tolerated the procedure well. The needle was removed and hemostasis was noted.   The pt will return in 1 week for PTNS session # 9. All questions were answered.

## 2024-05-21 ENCOUNTER — Ambulatory Visit: Admitting: Family Medicine

## 2024-05-21 VITALS — BP 129/93 | Ht 61.0 in | Wt 243.0 lb

## 2024-05-21 DIAGNOSIS — M79605 Pain in left leg: Secondary | ICD-10-CM

## 2024-05-21 NOTE — Patient Instructions (Addendum)
 Your MCL in your knee has healed based on your exam (this was sprained in the accident). Your hip MRI is reassuring - there's some degeneration of the cartilage which is normal for age and not caused by the fall. Your lumbar spine MRI shows degenerative changes (disc bulging) but no evidence that a nerve is being pinched going into your leg as a result of the fall. I would encourage you to talk to your primary care provider about getting a second opinion as these results indicate you should have made progress with the conservative treatments to date and I don't have anything further to offer.

## 2024-05-22 NOTE — Progress Notes (Signed)
 Interpreter present for today's visit.  MRIs reviewed and discussed with patient.  Left hip with some mild degenerative changes but no acute findings attributed to her fall.  Lumbar spine MRI also with degenerative changes - disc bulging without herniation, no impingement of nerve roots into left leg.   She continues to have pain in left leg.  Reexamination of knee with intact MCL on valgus without medial pain on stress consistent with healed MCL sprain which was injured in her fall.  I do not see anything objective at this point to account for her continued symptoms.  Advised she can consider getting a second opinion.

## 2024-05-24 ENCOUNTER — Ambulatory Visit

## 2024-05-28 ENCOUNTER — Ambulatory Visit (INDEPENDENT_AMBULATORY_CARE_PROVIDER_SITE_OTHER)

## 2024-05-28 VITALS — BP 118/83 | HR 82 | Ht 60.5 in | Wt 233.0 lb

## 2024-05-28 DIAGNOSIS — M25552 Pain in left hip: Secondary | ICD-10-CM

## 2024-05-28 DIAGNOSIS — M25562 Pain in left knee: Secondary | ICD-10-CM | POA: Diagnosis not present

## 2024-05-28 DIAGNOSIS — Z23 Encounter for immunization: Secondary | ICD-10-CM | POA: Diagnosis not present

## 2024-05-28 DIAGNOSIS — G8929 Other chronic pain: Secondary | ICD-10-CM | POA: Diagnosis not present

## 2024-05-28 NOTE — Progress Notes (Signed)
    SUBJECTIVE:   CHIEF COMPLAINT / HPI:   Deborah Chandler presents to the office today with continuing left knee, hip, low back pain.  This began after a fall in May where she slipped on a wet floor at [facility].  She has received NSAIDs, PT and 1 steroid injection in her left knee since then, but her pain remains significant and debilitating.  She has pain anytime she puts pressure on her leg, has pain with standing for prolonged periods of time, and pain with the pressure of tight fitting pants and leggings.  Patient uncomfortable getting in and out of the shower and car. 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  She reports the complexity of her, and that the injection gave her pain in her knee for 4 days and gave her only minimal benefit if any.  She is only taking Tylenol  for pain.  She would like a second opinion given that she did not find relief with the treatment plan from sports medicine.  She is frustrated that this pain is lingering for so long and that she feels she does not have an adequate explanation for why it is happening.  OBJECTIVE:   BP 118/83   Pulse 82   Ht 5' 0.5 (1.537 m)   Wt 233 lb (105.7 kg)   LMP 08/07/2022 (Exact Date)   SpO2 99%   BMI 44.76 kg/m   General: Awake, alert, NAD. Communicates clearly. Cardio: RRR. Normal S1, S2. No murmur, rub, gallop. 2+ radial and dorsalis pedis pulses b/l w/ good capillary refill. No LE edema.  Resp: CTA bilaterally. No wheezes, rales, or rhonchi. Normal work of breathing on room air.  MSK: R knee and hip have intact ROM w/o TTP or signs of injury.  No obvious swelling or deformity appreciated in L LE. Patient attains full ROM. Significant TTP over medial and lateral joint lines as well as the region roughly 6 inches superior and inferior. Soft tissues of the knee appear generally TTP. No laxity on varus and valgus stress test or  anterior / posterior drawer test, but these maneuvers do elicit pain. Mcmurray also exquisitely painful w/o locking.  L hip w/ intact ROM, pain w/ FABER position primarily down the lateral thigh and knee. FADIR negative.  Tenderness to palpation over left greater trochanteric bursa, left SI joint, left superior buttock, lumbar paraspinal muscles.  Left-sided low back pain with left sidebending and extension.  Negative straight leg raise.  Skin: No rash or lesions appreciated. No abnormal nevi.  Neuro: AOx4. Sensation to light touch intact and symmetrical bilaterally. No focal deficits.  Psych: Appropriate mood and affect. No SI/HI.    ASSESSMENT/PLAN:   Assessment & Plan Encounter for immunization Push administered in the office Chronic pain of left knee Left hip pain Patient has continued pain in her left lower extremity and back that remains debilitating even 5 months after her inciting fall. Her exam is non specific, unlikely that there is one unifying biomechanical explanation for her symptoms. She has not responded well to interventions so far including NSAIDs, physical therapy, steroid injection into her left knee. Sports medicine suggested she receive a second opinion as her imaging is relatively nonrevealing, does not straightforwardly explain the extent and distribution of her pain. Referred to physical medicine rehab for further evaluation   Deborah Scriver, DO [facility]

## 2024-05-28 NOTE — Patient Instructions (Addendum)
  Fue un placer conocerte hoy!  Hoy hablamos sobre tu dolor de rodilla, cadera y espalda. Hablamos sobre los tratamientos que has probado [employer], los otros especialistas que has consultado y [employer] de las Charity fundraiser. Dada la naturaleza atpica de tu dolor y la cantidad de reas afectadas, te remito a [facility], un especialista especializado en lesiones como esta que puede darte una segunda opinin. Puedes seguir tomando Tylenol  para el dolor si te burkina faso un poco y puedes seguir usando muletas si las necesitas. Te enviar la carta para tu Engineer, production, donde tambin podrs ver los resultados de tu resonancia magntica. Si no puedes verlos, contctame a travs de MyChart.  Kathlynn por elegir [facility].  Ezella al [phone] si tienes alguna pregunta sobre la [place]a de [place].  Leafy Scriver, DO [facility]

## 2024-05-31 ENCOUNTER — Ambulatory Visit: Admitting: Obstetrics and Gynecology

## 2024-05-31 ENCOUNTER — Encounter: Payer: Self-pay | Admitting: Obstetrics and Gynecology

## 2024-05-31 VITALS — BP 116/78 | HR 73

## 2024-05-31 DIAGNOSIS — N3281 Overactive bladder: Secondary | ICD-10-CM

## 2024-05-31 DIAGNOSIS — N3941 Urge incontinence: Secondary | ICD-10-CM

## 2024-05-31 NOTE — Progress Notes (Signed)
 [facility]  PTNS VISIT  CC:  Overactive bladder  43 y.o. with refractory overactive bladder who presents for percutaneous tibial nerve stimulation. The patient presents for PTNS session # 9.  Patient has some improvement continued in bladder support, but none in fecal urgency.    Procedure: The patient was placed in the sitting position and the right lower extremity was prepped in the usual fashion. The PTNS needle was then inserted at a 60 degree angle, 5 cm cephalad and 2 cm posterior to the medial malleolus. The PTNS unit was then programmed and an optimal response was noted at 2 milliamps. The PTNS stimulation was then performed at this setting for 30 minutes without incident and the patient tolerated the procedure well. The needle was removed and hemostasis was noted.   The pt will return in 1 week for PTNS session # 10. All questions were answered.

## 2024-06-07 ENCOUNTER — Encounter: Payer: Self-pay | Admitting: Obstetrics and Gynecology

## 2024-06-07 ENCOUNTER — Ambulatory Visit: Admitting: Obstetrics and Gynecology

## 2024-06-07 VITALS — BP 108/77 | HR 85

## 2024-06-07 DIAGNOSIS — N3941 Urge incontinence: Secondary | ICD-10-CM

## 2024-06-07 DIAGNOSIS — N3281 Overactive bladder: Secondary | ICD-10-CM | POA: Diagnosis not present

## 2024-06-07 NOTE — Progress Notes (Signed)
 [facility]  PTNS VISIT  CC:  Overactive bladder  43 y.o. with refractory overactive bladder who presents for percutaneous tibial nerve stimulation. The patient presents for PTNS session # 10.  Patient reports she had x2 episodes of bowel leakage in the past week. She has not been using her fiber supplementation.   Patient reports her bladder she still has some urgency, but it is still much improved since the start of the treatments.   Procedure: The patient was placed in the sitting position and the right lower extremity was prepped in the usual fashion. The PTNS needle was then inserted at a 60 degree angle, 5 cm cephalad and 2 cm posterior to the medial malleolus. The PTNS unit was then programmed and an optimal response was noted at 3 milliamps. The PTNS stimulation was then performed at this setting for 30 minutes without incident and the patient tolerated the procedure well. The needle was removed and hemostasis was noted.   The pt will return in 1 week for PTNS session # 11. All questions were answered.

## 2024-06-21 ENCOUNTER — Ambulatory Visit: Admitting: Obstetrics and Gynecology

## 2024-06-21 ENCOUNTER — Encounter: Payer: Self-pay | Admitting: Obstetrics and Gynecology

## 2024-06-21 VITALS — BP 125/88 | HR 84

## 2024-06-21 DIAGNOSIS — N3941 Urge incontinence: Secondary | ICD-10-CM

## 2024-06-21 DIAGNOSIS — N3281 Overactive bladder: Secondary | ICD-10-CM | POA: Diagnosis not present
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
## 2024-06-21 NOTE — Progress Notes (Signed)
 [facility]  PTNS VISIT  CC:  Overactive bladder  43 y.o. with refractory overactive bladder who presents for percutaneous tibial nerve stimulation. The patient presents for PTNS session # 11..   Procedure: The patient was placed in the sitting position and the right lower extremity was prepped in the usual fashion. The PTNS needle was then inserted at a 60 degree angle, 5 cm cephalad and 2 cm posterior to the medial malleolus. The PTNS unit was then programmed and an optimal response was noted at 1 milliamp. The PTNS stimulation was then performed at this setting for 30 minutes without incident and the patient tolerated the procedure well. The needle was removed and hemostasis was noted.   The pt will return in 1 week for PTNS session # 12. All questions were answered.

## 2024-06-22 ENCOUNTER — Encounter: Payer: Self-pay | Admitting: Physical Medicine & Rehabilitation

## 2024-06-22 ENCOUNTER — Encounter: Attending: Physical Medicine & Rehabilitation | Admitting: Physical Medicine & Rehabilitation

## 2024-06-22 VITALS — BP 117/89 | HR 82 | Ht 60.5 in | Wt 230.0 lb

## 2024-06-22 DIAGNOSIS — M25562 Pain in left knee: Secondary | ICD-10-CM | POA: Diagnosis not present

## 2024-06-22 DIAGNOSIS — G8929 Other chronic pain: Secondary | ICD-10-CM | POA: Diagnosis not present

## 2024-06-22 NOTE — Patient Instructions (Signed)
Acupuntura Acupuncture La acupuntura es un tipo de tratamiento en el que se estimulan puntos especficos del cuerpo con agujas delgadas que se introducen a travs de la piel. La acupuntura se utiliza a menudo para tratar [employer], pero tambin puede usarse para ayudar a Paramedic otros tipos de sntomas. El mdico puede recomendarle la acupuntura para ayudar a tratar varias afecciones, por ejemplo: Migraa y dolores de cabeza por tensin. Nuseas y vmitos luego de [place] o un tratamiento para Management consultant. Dolor repentino o intenso (agudo) o dolor a Air cabin crew (crnico). Adiccin. La acupuntura se basa en la medicina tradicional [place], que reconoce ms de 2000 puntos en el cuerpo que conectan canales de Engineer, drilling (meridianos) en todo el organismo. El objetivo de estimular estos puntos es equilibrar la energa fsica, emocional y mental del cuerpo. Un mdico con capacitacin especializada (acupunturista certificado) es quien realiza la acupuntura. Generalmente, el tratamiento requiere varias sesiones de acupuntura. Se puede hacer acupuntura junto con otros tratamientos mdicos. Informe al mdico acerca de lo siguiente: Cualquier alergia que tenga. Todos los [employer] [place], incluidos vitaminas, hierbas, gotas oftlmicas, cremas y [address] de [address]. Cualquier trastorno de la sangre que tenga. Cirugas a las que se haya sometido. Cualquier afeccin mdica que tenga. Si est embarazada o podra estarlo. Cules son los riesgos? En general, es un tratamiento seguro. Sin embargo, pueden ocurrir complicaciones, por ejemplo: Infeccin en la piel. Daos en los rganos o las estructuras que estn debajo de la piel si se inserta una aguja muy profundo. Esto es poco frecuente. Qu sucede antes del tratamiento? El acupunturista le preguntar sobre su historia clnica y los sntomas que tiene. Pueden hacerle un examen fsico. Qu ocurre durante el tratamiento? El procedimiento exacto  depender de su afeccin y de la forma en que el acupunturista la trate. En general: Le limpiarn la piel con una solucin que destruir las bacterias (antisptico). El acupunturista abrir un juego nuevo de agujas sin grmenes (estril). Le insertarn las agujas suavemente en la piel. Las dejarn puestas durante un cierto [place]. Es posible que sienta una sensacin de ardor u hormigueo durante un perodo de Pensions consultant. El acupunturista puede: Programme researcher, broadcasting/film/video a las agujas. Acomodar las agujas de [address]. Despus del procedimiento, le retirar las agujas, las Technical brewer y Archivist piel. El procedimiento puede variar segn el mdico. Qu podra pasar despus del tratamiento? Las Conservation officer, historic buildings de distintas formas a la acupuntura. Asegrese de preguntarle a su acupunturista sobre lo que debe esperar despus del tratamiento. Es normal tener lo siguiente: Moretones leves. Dolor leve. Un ligero sangrado. Siga estas indicaciones en su casa: Siga cualquier otra indicacin que le haya dado el mdico despus del tratamiento. Concurra a todas las visitas de seguimiento. Esto es importante. Dnde obtener ms informacin [employer] for [employer] and [facility] [employer] de Salud Complementaria e Marianne Sofia): [email or web address].nl Comunquese con un mdico si: Tiene preguntas relacionadas con su reaccin al tratamiento. Siente dolor. Tiene irritacin o enrojecimiento en la piel. Tiene fiebre. Resumen La acupuntura es un tipo de tratamiento en el que se estimulan puntos especficos del cuerpo con agujas delgadas que se introducen a travs de la piel. Este tratamiento se utiliza a menudo para tratar [employer], pero tambin puede usarse para ayudar a Paramedic otros tipos de sntomas. El procedimiento exacto depender de su afeccin y de la forma en que el acupunturista la trate. Esta informacin no tiene Theme park manager el consejo del mdico.  Asegrese de  hacerle al mdico cualquier pregunta que tenga. Document Revised: 05/18/2021 Document Reviewed: 05/18/2021 Elsevier Patient Education  2024 [employer].

## 2024-06-27 ENCOUNTER — Emergency Department (HOSPITAL_COMMUNITY)
Admission: EM | Admit: 2024-06-27 | Discharge: 2024-06-28 | Disposition: A | Discharge status: Home / Self Care | Attending: Emergency Medicine | Admitting: Emergency Medicine

## 2024-06-27 ENCOUNTER — Encounter (HOSPITAL_COMMUNITY): Payer: Self-pay | Admitting: Emergency Medicine

## 2024-06-27 ENCOUNTER — Other Ambulatory Visit: Payer: Self-pay

## 2024-06-27 ENCOUNTER — Emergency Department (HOSPITAL_COMMUNITY)

## 2024-06-27 DIAGNOSIS — R112 Nausea with vomiting, unspecified: Secondary | ICD-10-CM | POA: Insufficient documentation

## 2024-06-27 DIAGNOSIS — Z91040 Latex allergy status: Secondary | ICD-10-CM | POA: Insufficient documentation

## 2024-06-27 DIAGNOSIS — R1084 Generalized abdominal pain: Secondary | ICD-10-CM | POA: Insufficient documentation

## 2024-06-27 DIAGNOSIS — Z79899 Other long term (current) drug therapy: Secondary | ICD-10-CM | POA: Insufficient documentation

## 2024-06-27 DIAGNOSIS — I10 Essential (primary) hypertension: Secondary | ICD-10-CM | POA: Insufficient documentation

## 2024-06-27 DIAGNOSIS — R109 Unspecified abdominal pain: Secondary | ICD-10-CM | POA: Diagnosis present

## 2024-06-27 LAB — COMPREHENSIVE METABOLIC PANEL WITH GFR
ALT: 14 U/L (ref 0–44)
AST: 19 U/L (ref 15–41)
Albumin: 4 g/dL (ref 3.5–5.0)
Alkaline Phosphatase: 84 U/L (ref 38–126)
Anion gap: 9 (ref 5–15)
BUN: 17 mg/dL (ref 6–20)
CO2: 26 mmol/L (ref 22–32)
Calcium: 9 mg/dL (ref 8.9–10.3)
Chloride: 102 mmol/L (ref 98–111)
Creatinine, Ser: 0.87 mg/dL (ref 0.44–1.00)
GFR, Estimated: >60 mL/min (ref >60)
Glucose, Bld: 113 mg/dL — ABNORMAL HIGH (ref 70–99)
Potassium: 4.2 mmol/L (ref 3.5–5.1)
Sodium: 138 mmol/L (ref 135–145)
Total Bilirubin: 0.6 mg/dL (ref 0.0–1.2)
Total Protein: 7.3 g/dL (ref 6.5–8.1)

## 2024-06-27 LAB — CBC WITH DIFFERENTIAL/PLATELET
Abs Immature Granulocytes: 0.02 K/uL (ref 0.00–0.07)
Basophils Absolute: 0 K/uL (ref 0.0–0.1)
Basophils Relative: 0 %
Eosinophils Absolute: 0.1 K/uL (ref 0.0–0.5)
Eosinophils Relative: 1 %
HCT: 41.4 % (ref 36.0–46.0)
Hemoglobin: 12.7 g/dL (ref 12.0–15.0)
Immature Granulocytes: 0 %
Lymphocytes Relative: 36 %
Lymphs Abs: 3.4 K/uL (ref 0.7–4.0)
MCH: 24.9 pg — ABNORMAL LOW (ref 26.0–34.0)
MCHC: 30.7 g/dL (ref 30.0–36.0)
MCV: 81.2 fL (ref 80.0–100.0)
Monocytes Absolute: 0.5 K/uL (ref 0.1–1.0)
Monocytes Relative: 6 %
Neutro Abs: 5.5 K/uL (ref 1.7–7.7)
Neutrophils Relative %: 57 %
Platelets: 287 K/uL (ref 150–400)
RBC: 5.1 MIL/uL (ref 3.87–5.11)
RDW: 14.2 % (ref 11.5–15.5)
WBC: 9.6 K/uL (ref 4.0–10.5)
nRBC: 0 % (ref 0.0–0.2)

## 2024-06-27 LAB — URINALYSIS, ROUTINE W REFLEX MICROSCOPIC
Bilirubin Urine: NEGATIVE
Glucose, UA: NEGATIVE mg/dL
Hgb urine dipstick: NEGATIVE
Ketones, ur: NEGATIVE mg/dL
Leukocytes,Ua: NEGATIVE
Nitrite: NEGATIVE
Protein, ur: NEGATIVE mg/dL
Specific Gravity, Urine: >1.046 — ABNORMAL HIGH (ref 1.005–1.030)
pH: 5 (ref 5.0–8.0)

## 2024-06-27 LAB — LIPASE, BLOOD: Lipase: 42 U/L (ref 11–51)

## 2024-06-27 MED ORDER — ONDANSETRON 4 MG PO TBDP: 4.0000 mg | ORAL_TABLET | Freq: Three times a day (TID) | ORAL | 0 refills | Status: DC | PRN

## 2024-06-27 MED ORDER — IOHEXOL 300 MG/ML  SOLN
100.0000 mL | Freq: Once | INTRAMUSCULAR | Status: AC | PRN
Start: 2024-06-27 — End: 2024-06-27
  Administered 2024-06-27: 100 mL via INTRAVENOUS

## 2024-06-27 MED ORDER — AMLODIPINE BESYLATE 5 MG PO TABS
5.0000 mg | ORAL_TABLET | Freq: Once | ORAL | Status: AC
  Administered 2024-06-27: 5 mg via ORAL
  Filled 2024-06-27: qty 1

## 2024-06-27 MED ORDER — ONDANSETRON HCL 4 MG/2ML IJ SOLN
4.0000 mg | Freq: Once | INTRAMUSCULAR | Status: AC
  Administered 2024-06-27: 4 mg via INTRAVENOUS
  Filled 2024-06-27: qty 2

## 2024-06-27 MED ORDER — SODIUM CHLORIDE 0.9 % IV BOLUS: 1000.0000 mL | Freq: Once | INTRAVENOUS | Status: DC

## 2024-06-27 MED ORDER — ALUM & MAG HYDROXIDE-SIMETH 200-200-20 MG/5ML PO SUSP
30.0000 mL | Freq: Once | ORAL | Status: AC
  Administered 2024-06-27: 30 mL via ORAL
  Filled 2024-06-27: qty 30

## 2024-06-27 MED ORDER — KETOROLAC TROMETHAMINE 15 MG/ML IJ SOLN
15.0000 mg | Freq: Once | INTRAMUSCULAR | Status: DC
Start: 2024-06-27 — End: 2024-06-27
  Filled 2024-06-27: qty 1

## 2024-06-27 NOTE — Discharge Instructions (Signed)
 Zofran  as needed as prescribed for nausea and vomiting. Follow up with primary care provider.  Return to the ER for worsening or concerning symptoms.   Zofran  segn sea necesario y segn lo prescrito para nuseas y vmitos. Consulte con su mdico de cabecera. Regrese a urgencias si presenta sntomas preocupantes o empeoramiento.

## 2024-06-27 NOTE — ED Provider Triage Note (Signed)
 Emergency Medicine Provider Triage Evaluation Note  Ellice Boultinghouse , a 43 y.o. female  was evaluated in triage.  Pt complains of diffuse abdominal pain for the past week, but worsening last night. Last BM was today, not black or bloody. No fevers. Nausea and vomiting today. Denies any urinary symptoms. Describes as cramping.  Review of Systems  Positive:  Negative:   Physical Exam  BP (!) 142/105   Pulse 85   Temp 98.4 F (36.9 C) (Oral)   Resp 18   Ht 5' (1.524 m)   Wt 104 kg   LMP 08/07/2022 (Exact Date)   SpO2 97%   BMI 44.78 kg/m  Gen:   Awake, no distress   Resp:  Normal effort  MSK:   Moves extremities without difficulty  Other:  Diffuse abdominal tenderness, but soft without guarding or rebound.   Medical Decision Making  Medically screening exam initiated at 7:14 PM.  Appropriate orders placed.  Stephnie Parlier Puentes was informed that the remainder of the evaluation will be completed by another provider, this initial triage assessment does not replace that evaluation, and the importance of remaining in the ED until their evaluation is complete.  Labs and CT imaging ordered   Bernis Ernst, DEVONNA 06/27/24 [id number]

## 2024-06-27 NOTE — ED Provider Notes (Signed)
 43 yo female with abdominal pain for a few days with vomiting, diarrhea. Urinary frequency without pain.  HTN, awaiting home meds. Awaiting UA, likely dc.  Physical Exam  BP (!) 152/110 (BP Location: Left Arm)   Pulse 81   Temp 98.2 F (36.8 C) (Oral)   Resp 19   Ht 5' (1.524 m)   Wt 104 kg   LMP 08/07/2022 (Exact Date)   SpO2 100%   BMI 44.78 kg/m   Physical Exam  Procedures  Procedures  ED Course / MDM   Clinical Course as of 06/27/24 2355  Wed Jun 27, 2024  2255 Patient evaluated for 1 week of abdominal pain that has worsened in the past day. Associated with nausea without vomiting or diarrhea. No vaginal symptoms. Does endorse increased frequency without dysuria. Work up overall is reassuring without any significant abnormality. Will obtain urine and treat with Gi cocktail. Noted to be hypertensive but asymptomatic and did not take her home meds, will treat with home meds.  [JT]    Clinical Course User Index [JT] Donnajean Lynwood DEL, PA-C   Medical Decision Making Risk OTC drugs. Prescription drug management.   Urine is unremarkable.  Patient is tolerating p.o. fluids.  Blood pressure improved.  She is discharged with prescription for Zofran  for the nausea and vomiting referred to [facility] health wellness for follow-up.       Beverley Leita LABOR, PA-C 06/27/24 BERLINDA    Griselda Norris, MD 06/28/24 [phone]
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
## 2024-06-27 NOTE — ED Triage Notes (Signed)
 Using interpreter, pt endorses a week of generalized abd pain that worsened yesterday. Nausea with no emesis. Denies urinary symptoms.

## 2024-06-28 ENCOUNTER — Encounter: Payer: Self-pay | Admitting: Obstetrics and Gynecology

## 2024-06-28 ENCOUNTER — Ambulatory Visit: Admitting: Obstetrics and Gynecology

## 2024-06-28 VITALS — BP 116/81 | HR 82

## 2024-06-28 DIAGNOSIS — N3281 Overactive bladder: Secondary | ICD-10-CM | POA: Diagnosis not present

## 2024-06-28 DIAGNOSIS — N3941 Urge incontinence: Secondary | ICD-10-CM

## 2024-06-28 NOTE — Progress Notes (Signed)
  Urogynecology  PTNS VISIT  CC:  Overactive bladder  43 y.o. with refractory overactive bladder who presents for percutaneous tibial nerve stimulation. The patient presents for PTNS session # 12.  Patient reports she has had good improvement in her bladder symptoms and has had some improvement in bowel with her use of fiber as well. Patient denies frequent leakage or accidents and overall feels this has been helpful.   Procedure: The patient was placed in the sitting position and the right lower extremity was prepped in the usual fashion. The PTNS needle was then inserted at a 60 degree angle, 5 cm cephalad and 2 cm posterior to the medial malleolus. The PTNS unit was then programmed and an optimal response was noted at 3 milliamps. The PTNS stimulation was then performed at this setting for 30 minutes without incident and the patient tolerated the procedure well. The needle was removed and hemostasis was noted.    Will plan to follow up with patient after the new year and see how her symptoms are

## 2024-06-29 ENCOUNTER — Ambulatory Visit (INDEPENDENT_AMBULATORY_CARE_PROVIDER_SITE_OTHER): Admitting: Student

## 2024-06-29 VITALS — BP 126/84 | HR 72 | Ht 60.5 in | Wt 230.8 lb

## 2024-06-29 DIAGNOSIS — R1030 Lower abdominal pain, unspecified: Secondary | ICD-10-CM | POA: Diagnosis not present

## 2024-06-29 MED ORDER — OMEPRAZOLE 20 MG PO CPDR: 20.0000 mg | DELAYED_RELEASE_CAPSULE | Freq: Every day | ORAL | 0 refills | Status: DC

## 2024-06-29 NOTE — Progress Notes (Signed)
    SUBJECTIVE:   CHIEF COMPLAINT / HPI:   Deborah Chandler is a 43 year old female who presents with severe abdominal pain and nausea.  She has experienced severe abdominal pain for the past week, initially mild but intensifying to a 10 out of 10 on Tuesday. The pain is described as pressing and burning, diffuse across the abdomen. Constipation is present, with the last bowel movement yesterday being hard but of normal size. There is no diarrhea, black, or bloody stools. Nausea occurred on Wednesday and slightly yesterday, without vomiting.  She underwent a recent hysterectomy and is unsure about the status of her ovaries. She denies any possibility of pregnancy. Pain during urination was noted yesterday, but there is no back pain. No fever or chills have been experienced, though she felt cold last night.  Her past medical history includes gastritis, with no current heartburn or acid reflux. She does not smoke or consume alcohol.  Patient and daughter given her history of chronic constipation I expressed desire for a gastroenterology referral.  PERTINENT  PMH / PSH: Reviewed   OBJECTIVE:   BP 126/84   Pulse 72   Ht 5' 0.5 (1.537 m)   Wt 230 lb 12.8 oz (104.7 kg)   LMP 08/07/2022 (Exact Date)   SpO2 97%   BMI 44.33 kg/m    Physical Exam General: Alert, well appearing, NAD Cardiovascular: RRR, No Murmurs, Normal S2/S2 Respiratory: CTAB, No wheezing or Rales Abdomen: Soft, no distention, diffuse tenderness, positive bowel sounds.  No guarding or rebound tenderness. Extremities: No edema on extremities     ASSESSMENT/PLAN:   Abdominal pain Abdominal pain 8/10, diffuse, with constipation and nausea.  Currently getting Zofran  from urgent care yesterday.  Differential included pancreatitis, gastritis, cystitis, gastric/peptic ulcer.  Recent lab work at urgent care including lipase, CMP, and UA were generally negative. - Order H. pylori test for peptic ulcer disease. -  Initiate 30 day antacid trial. - Advise acetaminophen  or ibuprofen  for pain - Placed referral to gastroenterologist.   Norleen April, MD [facility]

## 2024-06-29 NOTE — Patient Instructions (Addendum)
 Un placer conocerle hoy.  Para su dolor abdominal, le he enviado una receta de omeprazol, que tomar Building control surveyor al da durante [address] Es un anticido.  Tambin le han pedido anlisis de laboratorio para Engineer, manufacturing H. pylori.  En este momento, puede tomar Tylenol  o ibuprofeno para el dolor abdominal.  Tambin le hemos remitido a Futures trader. Espere su llamada para programar una cita.  Pleasure to meet you today.  Your abdominal pain I have sent in prescription for omeprazole  which you will take once daily for 30 days.  This is an antacid.  You have also ordered labs to check for H. pylori.  At this time you can do Tylenol  or ibuprofen  for abdominal pain.  Also a referral to a gastroenterology specialist has been placed.  Please look out for a call from them to schedule an appointment.

## 2024-07-01 LAB — H. PYLORI BREATH TEST: H pylori Breath Test: POSITIVE — AB

## 2024-07-02 ENCOUNTER — Other Ambulatory Visit: Payer: Self-pay | Admitting: Student

## 2024-07-02 ENCOUNTER — Other Ambulatory Visit (HOSPITAL_COMMUNITY): Payer: Self-pay

## 2024-07-02 ENCOUNTER — Other Ambulatory Visit: Payer: Self-pay

## 2024-07-02 DIAGNOSIS — A048 Other specified bacterial intestinal infections: Secondary | ICD-10-CM

## 2024-07-02 MED ORDER — BISMUTH/METRONIDAZ/TETRACYCLIN 140-125-125 MG PO CAPS
3.0000 | ORAL_CAPSULE | Freq: Four times a day (QID) | ORAL | 0 refills | Status: DC
  Filled 2024-07-02: qty 120, 10d supply, fill #0

## 2024-07-02 NOTE — Progress Notes (Signed)
 Spoke to patient's daughter Philippa), who confirmed patient's date of birth.  Informed her that her mom's recent test tested positive for H. pylori.  I have sent in prescription for Pylera which daughter was informed patient is to take the medication 4 times a day for 10 days.  Also advised increasing the omeprazole  dose to 2 times a day for 14 days and then once daily afterwards.  Daughter verbalized understanding and agreeable to plan.  Inform them medication will be sent to [facility] pharmacy on [address].

## 2024-07-03 ENCOUNTER — Other Ambulatory Visit (HOSPITAL_COMMUNITY): Payer: Self-pay

## 2024-07-03 ENCOUNTER — Other Ambulatory Visit

## 2024-07-26 ENCOUNTER — Encounter: Payer: Self-pay | Admitting: Physical Medicine & Rehabilitation

## 2024-07-26 ENCOUNTER — Encounter: Attending: Physical Medicine & Rehabilitation | Admitting: Physical Medicine & Rehabilitation

## 2024-07-26 VITALS — Wt 227.0 lb

## 2024-07-26 DIAGNOSIS — G8929 Other chronic pain: Secondary | ICD-10-CM | POA: Insufficient documentation

## 2024-07-26 DIAGNOSIS — M25562 Pain in left knee: Secondary | ICD-10-CM | POA: Insufficient documentation

## 2024-07-26 NOTE — Progress Notes (Signed)
 Acupuncture treatment #1  Indication left knee pain after fall, Jan 08, 2024  Spanish interpreter in room  Discussed acupuncture treatment answered patient's questions  Genicular points around the left knee placed ST 34 ST 35 SP 9 and SP 10.  40 mm #5 stainless steel sterile disposable Seirin needles  Electrical stimulation at 2 Hz between SP 10 and ST 35 as well as between SP 9 and ST 34 20-minute treatment Patient tolerated procedure well Plan for weekly visits x 4 total to assess efficacy

## 2024-08-07 ENCOUNTER — Encounter: Payer: Self-pay | Admitting: Physical Medicine & Rehabilitation

## 2024-08-07 ENCOUNTER — Encounter: Admitting: Physical Medicine & Rehabilitation

## 2024-08-07 VITALS — BP 115/83 | HR 80 | Ht 60.05 in | Wt 234.4 lb

## 2024-08-07 DIAGNOSIS — G8929 Other chronic pain: Secondary | ICD-10-CM | POA: Diagnosis present

## 2024-08-07 DIAGNOSIS — M25562 Pain in left knee: Secondary | ICD-10-CM | POA: Diagnosis present

## 2024-08-07 NOTE — Progress Notes (Signed)
 Acupuncture treatment #1  Indication left knee pain after fall, Jan 08, 2024  Spanish interpreter in room  Discussed acupuncture treatment answered patient's questions  Genicular points around the left knee placed ST 34 ST 35 SP 9 and SP 10.  GB 31 - GB 34 40 mm #5 stainless steel sterile disposable Seirin needles  Electrical stimulation at 2 Hz between GB 31 - GB 34, SP 10 and ST 35 as well as between SP 9 and ST 34 20-minute treatment Patient tolerated procedure well Plan for weekly visits x 4 total to assess efficacy  Patient requesting a note explaining the medical necessity of the crutches as well as work restrictions.  I stated that this is not what I am recommending.  I was willing to write a note regarding the fact that she has not been working due to knee pain.

## 2024-08-14 ENCOUNTER — Encounter: Payer: Self-pay | Admitting: Physical Medicine & Rehabilitation

## 2024-08-14 ENCOUNTER — Encounter: Admitting: Physical Medicine & Rehabilitation

## 2024-08-14 ENCOUNTER — Telehealth: Payer: Self-pay

## 2024-08-14 VITALS — BP 114/79 | HR 82 | Ht 60.0 in | Wt 227.0 lb

## 2024-08-14 DIAGNOSIS — G8929 Other chronic pain: Secondary | ICD-10-CM | POA: Diagnosis not present

## 2024-08-14 DIAGNOSIS — M25562 Pain in left knee: Secondary | ICD-10-CM | POA: Diagnosis not present

## 2024-08-14 NOTE — Telephone Encounter (Signed)
 Patients daughter calls nurse line requesting a letter of support.  She reports in order for the patient to continue receiving food stamps, she needs a letter from her PCP stating she can not work due to chronic knee pain.   Daughter reports she reached out to Dr. Gunnar, however he reported PCP would need to provide.   Advised will forward to PCP.

## 2024-08-14 NOTE — Progress Notes (Signed)
 Acupuncture treatment #3  Indication left knee pain after fall, Jan 08, 2024  Spanish interpreter in room  Discussed acupuncture treatment answered patient's questions  Genicular points around the left knee placed ST 34 ST 35 SP 9 and SP 10.  40 mm #5 stainless steel sterile disposable Seirin needles  Electrical stimulation at 2 Hz between SP 10 and ST 35 as well as between SP 9 and ST 34 20-minute treatment Patient tolerated procedure well Plan for weekly visits x 4 total to assess efficacyAcupuncture treatment #3  Indication left knee pain after fall, Jan 08, 2024  Spanish interpreter in room  Discussed acupuncture treatment answered patient's questions  Patient requesting a note explaining work restrictions. We discussed that with the lack of abnormal findings on imaging studies, no work restrictions can be recommended.

## 2024-08-21 ENCOUNTER — Encounter: Admitting: Physical Medicine & Rehabilitation

## 2024-09-17 ENCOUNTER — Encounter: Payer: Self-pay | Admitting: Family Medicine

## 2024-09-18 ENCOUNTER — Telehealth: Payer: Self-pay

## 2024-09-18 NOTE — Telephone Encounter (Signed)
 Patient's daughter calls nurse line for 2 reasons.  She is checking status of letter completion. Advised that this was sent via mychart yesterday. She requests that this also be mailed.  Lavell also reports that mother noticed itching on her lip yesterday evening. She states that she then began to bite on lips to help alleviate itching. This resulted in slight swelling around mouth/lips. She denies difficulty with breathing or swallowing. She has been using ice for sxm management. Advised that they could continue using ice PRN and that she could also try OTC allergy medication. Advised of return precautions.   Chiquita JAYSON English, RN

## 2024-09-20 ENCOUNTER — Encounter: Attending: Physical Medicine & Rehabilitation | Admitting: Physical Medicine & Rehabilitation

## 2024-09-20 ENCOUNTER — Encounter: Payer: Self-pay | Admitting: Physical Medicine & Rehabilitation

## 2024-09-20 VITALS — BP 128/83 | HR 87 | Ht 60.0 in | Wt 229.0 lb

## 2024-09-20 DIAGNOSIS — M25562 Pain in left knee: Secondary | ICD-10-CM | POA: Insufficient documentation

## 2024-09-20 DIAGNOSIS — G8929 Other chronic pain: Secondary | ICD-10-CM | POA: Diagnosis not present

## 2024-09-20 NOTE — Patient Instructions (Signed)
Acupuntura Acupuncture La acupuntura es un tipo de tratamiento en el que se estimulan puntos especficos del cuerpo con agujas delgadas que se introducen a travs de la piel. La acupuntura se utiliza a menudo para tratar [employer], pero tambin puede usarse para ayudar a Paramedic otros tipos de sntomas. El mdico puede recomendarle la acupuntura para ayudar a tratar varias afecciones, por ejemplo: Migraa y dolores de cabeza por tensin. Nuseas y vmitos luego de [place] o un tratamiento para Management consultant. Dolor repentino o intenso (agudo) o dolor a Air cabin crew (crnico). Adiccin. La acupuntura se basa en la medicina tradicional [place], que reconoce ms de 2000 puntos en el cuerpo que conectan canales de Engineer, drilling (meridianos) en todo el organismo. El objetivo de estimular estos puntos es equilibrar la energa fsica, emocional y mental del cuerpo. Un mdico con capacitacin especializada (acupunturista certificado) es quien realiza la acupuntura. Generalmente, el tratamiento requiere varias sesiones de acupuntura. Se puede hacer acupuntura junto con otros tratamientos mdicos. Informe al mdico acerca de lo siguiente: Cualquier alergia que tenga. Todos los [employer] [place], incluidos vitaminas, hierbas, gotas oftlmicas, cremas y [address] de [address]. Cualquier trastorno de la sangre que tenga. Cirugas a las que se haya sometido. Cualquier afeccin mdica que tenga. Si est embarazada o podra estarlo. Cules son los riesgos? En general, es un tratamiento seguro. Sin embargo, pueden ocurrir complicaciones, por ejemplo: Infeccin en la piel. Daos en los rganos o las estructuras que estn debajo de la piel si se inserta una aguja muy profundo. Esto es poco frecuente. Qu sucede antes del tratamiento? El acupunturista le preguntar sobre su historia clnica y los sntomas que tiene. Pueden hacerle un examen fsico. Qu ocurre durante el tratamiento? El procedimiento exacto  depender de su afeccin y de la forma en que el acupunturista la trate. En general: Le limpiarn la piel con una solucin que destruir las bacterias (antisptico). El acupunturista abrir un juego nuevo de agujas sin grmenes (estril). Le insertarn las agujas suavemente en la piel. Las dejarn puestas durante un cierto [place]. Es posible que sienta una sensacin de ardor u hormigueo durante un perodo de Pensions consultant. El acupunturista puede: Programme researcher, broadcasting/film/video a las agujas. Acomodar las agujas de [address]. Despus del procedimiento, le retirar las agujas, las Technical brewer y Archivist piel. El procedimiento puede variar segn el mdico. Qu podra pasar despus del tratamiento? Las Conservation officer, historic buildings de distintas formas a la acupuntura. Asegrese de preguntarle a su acupunturista sobre lo que debe esperar despus del tratamiento. Es normal tener lo siguiente: Moretones leves. Dolor leve. Un ligero sangrado. Siga estas indicaciones en su casa: Siga cualquier otra indicacin que le haya dado el mdico despus del tratamiento. Concurra a todas las visitas de seguimiento. Esto es importante. Dnde obtener ms informacin [employer] for [employer] and [facility] [employer] de Salud Complementaria e Marianne Sofia): [email or web address].nl Comunquese con un mdico si: Tiene preguntas relacionadas con su reaccin al tratamiento. Siente dolor. Tiene irritacin o enrojecimiento en la piel. Tiene fiebre. Resumen La acupuntura es un tipo de tratamiento en el que se estimulan puntos especficos del cuerpo con agujas delgadas que se introducen a travs de la piel. Este tratamiento se utiliza a menudo para tratar [employer], pero tambin puede usarse para ayudar a Paramedic otros tipos de sntomas. El procedimiento exacto depender de su afeccin y de la forma en que el acupunturista la trate. Esta informacin no tiene Theme park manager el consejo del mdico.  Asegrese de  hacerle al mdico cualquier pregunta que tenga. Document Revised: 05/18/2021 Document Reviewed: 05/18/2021 Elsevier Patient Education  2024 [employer].

## 2024-09-20 NOTE — Progress Notes (Signed)
 Acupuncture treatment #4  Indication left knee pain after fall, Jan 08, 2024  Spanish interpreter in room  Discussed acupuncture treatment answered patient's questions  Genicular points around the left knee placed ST 34 GB 34 SP 9 and SP 10.  60 mm #5 stainless steel sterile disposable Seirin needles  Electrical stimulation at 2 Hz between SP 10 and ST 35 as well as between SP 9 and ST 34 20-minute treatment Patient tolerated procedure well Plan for weekly visits x 4 total to assess efficacyAcupuncture treatment #4, there was a gap of >37month since last treatment on 08/14/2024, therefore will schedule an additional 4 visits  Indication left knee pain after fall, Jan 08, 2024  Spanish interpreter in room  Discussed acupuncture treatment answered patient's questions  Patient requesting a note explaining work restrictions. We discussed that with the lack of abnormal findings on imaging studies, no work restrictions can be recommended.

## 2024-09-25 ENCOUNTER — Telehealth: Payer: Self-pay | Admitting: Physical Medicine & Rehabilitation

## 2024-09-25 NOTE — Telephone Encounter (Signed)
 Patient having heel pain and wanted to know if you could treat this for her.

## 2024-09-26 ENCOUNTER — Ambulatory Visit: Admission: RE | Admit: 2024-09-26 | Discharge: 2024-09-26 | Disposition: A | Source: Ambulatory Visit

## 2024-09-26 ENCOUNTER — Ambulatory Visit: Payer: Self-pay

## 2024-09-26 VITALS — BP 142/89 | HR 86 | Ht 60.0 in | Wt 230.0 lb

## 2024-09-26 DIAGNOSIS — M25572 Pain in left ankle and joints of left foot: Secondary | ICD-10-CM | POA: Diagnosis present

## 2024-09-26 DIAGNOSIS — G8929 Other chronic pain: Secondary | ICD-10-CM

## 2024-09-26 MED ORDER — MELOXICAM 15 MG PO TABS: 15.0000 mg | ORAL_TABLET | Freq: Every day | ORAL | 0 refills | Status: DC

## 2024-09-26 NOTE — Patient Instructions (Addendum)
 It was great to see you! Thank you for allowing me to participate in your care!  Our plans for today:   VISIT SUMMARY: Today, you were seen for severe right ankle pain that has been ongoing for three days. You are experiencing significant pain that affects your daily activities and ability to work.  YOUR PLAN: RIGHT ANKLE PAIN: You have severe burning pain in your right ankle that has been present for three days, affecting your ability to walk and perform daily activities. -An x-ray of your right ankle has been ordered to check for osteoarthritis or other structural issues. -You have been prescribed NSAIDs to manage pain and swelling for one week. -An ankle brace has been provided for additional support. -You have been referred to sports medicine for further evaluation and management  Imaging center: [facility] [address]  [phone]  Contains text generated by [employer].    Please arrive 15 minutes PRIOR to your next scheduled appointment time! If you do not, this affects OTHER patients' care.  Take care and seek immediate care sooner if you develop any concerns.   Ozell Provencal, MD, PGY-3 [facility] 9:30 AM 09/26/2024  [facility]

## 2024-09-26 NOTE — Progress Notes (Signed)
" ° ° °  SUBJECTIVE:   CHIEF COMPLAINT / HPI: Heel pain  Discussed the use of AI scribe software for clinical note transcription with the patient, who gave verbal consent to proceed.  History of Present Illness Deborah Chandler is a 44 year old female who presents with severe right ankle pain.  Right ankle pain - Severe burning pain in the right ankle for three days - Pain is constant and worsened by both walking and rest - Intensity of pain brings her to tears - Pain limits ambulation and daily activities - No recent ankle trauma - History of fall with knee injury approximately seven months ago - History of prior foot injury several years ago - Currently using crutches, which provide some relief - No analgesic medications taken for this episode - Unable to work due to pain - No known allergy to NSAIDs or ibuprofen     PERTINENT  PMH / PSH: Hypertension, GERD, IBS  OBJECTIVE:   BP (!) 142/89   Pulse 86   Ht 5' (1.524 m)   Wt 230 lb (104.3 kg)   LMP 07/10/2022   SpO2 97%   BMI 44.92 kg/m   Physical Exam General: NAD, well appearing Neuro: A&O Respiratory: normal WOB on RA Extremities: Moving all 4 extremities equally Left Ankle: no obvious deformity or bruising, swelling present over left ankle at the, lateral ankle joint line, ATFL and lateral malleoli, mildly tender to palpation in these areas, full ROM in ankle plantarflexion, dorsiflexion, inversion and eversion, strength intact in these directions, negative anterior drawer, negative talar tilt   ASSESSMENT/PLAN:   Assessment & Plan Chronic pain of left ankle Acute on chronic flare of pain with hx of multiple injuries to the ankle. Suspect that patient likely has some early degenerative changes around her ankle that has flared. Without an acute mechanism, less likely acute ankle sprain. May have component of chronic peroneal tendinopathy, given location. - 7 day course of meloxicam  15mg  daily - Continue prn  Tylenol  - Continue offloading weight with crutches - ASO brace - complete left ankle x ray - ambulatory referral to sports medicine - RICE   Return in 2 weeks if not follow-up with sports medicine.  Precepted with Dr. Delores Ozell Provencal, MD, PGY-3 [facility] 9:49 AM 09/26/2024  [facility]   "

## 2024-09-27 ENCOUNTER — Telehealth: Payer: Self-pay

## 2024-09-27 NOTE — Telephone Encounter (Signed)
 Patient's daughter calls nurse line regarding issues with letter that was previously written by PCP on 09/17/24.  She states that letter stating that patient is limited in her ability to work is not sufficient.   She says that letter needs to indicate that patient is physically unable to work due to her medical conditions with knee pain, heel and ankle pain. Daughter states that she is unable to sit or stand.   Daughter is requesting that this be sent to patient's mychart as soon as possible so case does not get closed.   Chiquita JAYSON English, RN

## 2024-10-02 ENCOUNTER — Ambulatory Visit: Admitting: Obstetrics and Gynecology

## 2024-10-03 ENCOUNTER — Other Ambulatory Visit: Payer: Self-pay

## 2024-10-03 ENCOUNTER — Ambulatory Visit: Admitting: Internal Medicine

## 2024-10-03 ENCOUNTER — Encounter: Payer: Self-pay | Admitting: Internal Medicine

## 2024-10-03 ENCOUNTER — Ambulatory Visit: Payer: Self-pay | Admitting: Family Medicine

## 2024-10-03 VITALS — BP 130/93 | Ht 60.0 in | Wt 230.0 lb

## 2024-10-03 DIAGNOSIS — G8929 Other chronic pain: Secondary | ICD-10-CM

## 2024-10-03 DIAGNOSIS — M79672 Pain in left foot: Secondary | ICD-10-CM

## 2024-10-03 DIAGNOSIS — M25572 Pain in left ankle and joints of left foot: Secondary | ICD-10-CM | POA: Diagnosis not present

## 2024-10-03 MED ORDER — MELOXICAM 15 MG PO TABS: 15.0000 mg | ORAL_TABLET | Freq: Every day | ORAL | 0 refills | Status: AC
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
## 2024-10-04 ENCOUNTER — Encounter (INDEPENDENT_AMBULATORY_CARE_PROVIDER_SITE_OTHER): Payer: Self-pay | Admitting: Physician Assistant

## 2024-10-04 ENCOUNTER — Ambulatory Visit (INDEPENDENT_AMBULATORY_CARE_PROVIDER_SITE_OTHER): Admitting: Physician Assistant

## 2024-10-04 VITALS — BP 134/83 | HR 75 | Ht 62.0 in | Wt 227.1 lb

## 2024-10-04 DIAGNOSIS — H6123 Impacted cerumen, bilateral: Secondary | ICD-10-CM | POA: Diagnosis not present
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
## 2024-10-05 ENCOUNTER — Encounter: Payer: Self-pay | Admitting: Physician Assistant

## 2024-10-05 NOTE — Telephone Encounter (Signed)
 Called daughter and advised of letter completion.   She was appreciative.   Chiquita JAYSON English, RN

## 2024-10-09 ENCOUNTER — Encounter: Payer: Self-pay | Admitting: Family Medicine

## 2024-10-09 ENCOUNTER — Ambulatory Visit: Payer: Self-pay | Admitting: Family Medicine

## 2024-10-09 VITALS — BP 154/108 | HR 77 | Temp 97.9°F | Ht 62.0 in | Wt 229.0 lb

## 2024-10-09 DIAGNOSIS — A048 Other specified bacterial intestinal infections: Secondary | ICD-10-CM | POA: Diagnosis present

## 2024-10-09 DIAGNOSIS — I10 Essential (primary) hypertension: Secondary | ICD-10-CM | POA: Diagnosis not present

## 2024-10-09 MED ORDER — AMLODIPINE-OLMESARTAN 5-20 MG PO TABS: 1.0000 | ORAL_TABLET | Freq: Every day | ORAL | 3 refills | Status: AC

## 2024-10-09 NOTE — Progress Notes (Signed)
" ° ° °  SUBJECTIVE:   CHIEF COMPLAINT / HPI:   Spansih ipad interpretor was used during visit.   Discussed the use of AI scribe software for clinical note transcription with the patient, who gave verbal consent to proceed.  History of Present Illness Deborah Chandler is a 44 year old female who presents for follow-up testing to ensure H. pylori infection was treated properly.  Helicobacter pylori infection follow-up - Completed prescribed medication regimen for H. pylori infection in October - No longer taking omeprazole ; discontinued more than two weeks ago (exact date unknown) - Present for follow-up testing to confirm eradication of infection  Upper respiratory symptoms - Experiencing symptoms of a cold for the past week - Similar symptoms present in other family members  Hypertension management - Currently prescribed antihypertensive medication - Has not taken antihypertensive medication for the past few days due to forgetfulness - Requests refill of blood pressure medication  Musculoskeletal symptoms - Currently taking medication for ankle symptoms (medication name unknown)    Tested positive for H pylori 07/02/24, was treated with Pylera .   PERTINENT  PMH / PSH: prior H pylori infxn, HTN, prediabetes  OBJECTIVE:   BP (!) 154/108   Pulse 77   Temp 97.9 F (36.6 C)   Ht 5' 2 (1.575 m)   Wt 229 lb (103.9 kg)   LMP 07/10/2022   SpO2 98%   BMI 41.88 kg/m   General: Alert, pleasant woman. NAD. HEENT: NCAT. MMM. CV: RRR, no murmurs. Cap refill <2. Resp: CTAB, no wheezing or crackles. Normal WOB on RA.  Abm: Soft, nontender, nondistended. BS present. Ext: Moves all ext spontaneously Skin: Warm, well perfused   ASSESSMENT/PLAN:   Assessment & Plan H. pylori infection S/p Pylera  and off PPI for >2 weeks. Will check H pylori breath test of cure today.  - Plan to send results via mychart Primary hypertension BP above goal today, 2/2 medication nonadherence.  Counseled on medication adherence and provided extra refill of Azor . PT previously well controlle don this dose so I will not change it.      Twyla Nearing, MD [facility] "

## 2024-10-09 NOTE — Assessment & Plan Note (Signed)
 BP above goal today, 2/2 medication nonadherence. Counseled on medication adherence and provided extra refill of Azor . PT previously well controlle don this dose so I will not change it.

## 2024-10-09 NOTE — Patient Instructions (Addendum)
 Hoy volveremos a revisarlo(a) para detectar H. pylori. Con suerte, la prueba saldr negativa y la infeccin habr desaparecido. Si el resultado sale positivo, le recetar otro tratamiento con antibiticos.  Le enviar los resultados por MyChart para que sea ms fcil traducirlos al espaol.   1) We will check your for H pylori again today. Hopefully the test comes back negative and the infection is gone. If it comes back positive, I will order another course of antibiotics for you. - I will send the results on MyChart so it's easier to translate to spanish

## 2024-10-10 ENCOUNTER — Ambulatory Visit: Admitting: Obstetrics and Gynecology

## 2024-10-11 LAB — H. PYLORI BREATH TEST: H pylori Breath Test: NEGATIVE

## 2024-10-16 ENCOUNTER — Encounter: Admitting: Physical Medicine & Rehabilitation

## 2024-10-29 ENCOUNTER — Ambulatory Visit: Admitting: Obstetrics and Gynecology

## 2024-10-30 ENCOUNTER — Ambulatory Visit: Admitting: Physician Assistant

## 2024-10-30 ENCOUNTER — Encounter: Admitting: Physical Medicine & Rehabilitation

## 2024-11-13 ENCOUNTER — Encounter: Admitting: Physical Medicine & Rehabilitation

## 2024-11-14 ENCOUNTER — Ambulatory Visit: Admitting: Internal Medicine

## 2024-11-27 ENCOUNTER — Encounter: Admitting: Physical Medicine & Rehabilitation

## 2025-04-04 ENCOUNTER — Ambulatory Visit (INDEPENDENT_AMBULATORY_CARE_PROVIDER_SITE_OTHER): Admitting: Physician Assistant
